# Patient Record
Sex: Female | Born: 1935 | Race: White | Hispanic: No | Marital: Married | Smoking: Former smoker
Health system: Southern US, Community
[De-identification: ages and names within clinical notes are randomized; demographics above are authoritative.]

## PROBLEM LIST (undated history)

## (undated) DIAGNOSIS — C4490 Unspecified malignant neoplasm of skin, unspecified: Secondary | ICD-10-CM

## (undated) DIAGNOSIS — I509 Heart failure, unspecified: Secondary | ICD-10-CM

## (undated) DIAGNOSIS — J449 Chronic obstructive pulmonary disease, unspecified: Secondary | ICD-10-CM

## (undated) DIAGNOSIS — K659 Peritonitis, unspecified: Secondary | ICD-10-CM

## (undated) DIAGNOSIS — D649 Anemia, unspecified: Secondary | ICD-10-CM

## (undated) DIAGNOSIS — H3530 Unspecified macular degeneration: Secondary | ICD-10-CM

## (undated) DIAGNOSIS — W19XXXA Unspecified fall, initial encounter: Secondary | ICD-10-CM

## (undated) DIAGNOSIS — IMO0002 Reserved for concepts with insufficient information to code with codable children: Secondary | ICD-10-CM

## (undated) DIAGNOSIS — I10 Essential (primary) hypertension: Secondary | ICD-10-CM

## (undated) DIAGNOSIS — I739 Peripheral vascular disease, unspecified: Secondary | ICD-10-CM

## (undated) DIAGNOSIS — I6529 Occlusion and stenosis of unspecified carotid artery: Secondary | ICD-10-CM

## (undated) DIAGNOSIS — Y92009 Unspecified place in unspecified non-institutional (private) residence as the place of occurrence of the external cause: Secondary | ICD-10-CM

## (undated) DIAGNOSIS — K224 Dyskinesia of esophagus: Secondary | ICD-10-CM

## (undated) DIAGNOSIS — Z87440 Personal history of urinary (tract) infections: Secondary | ICD-10-CM

## (undated) DIAGNOSIS — I5022 Chronic systolic (congestive) heart failure: Secondary | ICD-10-CM

## (undated) DIAGNOSIS — C50919 Malignant neoplasm of unspecified site of unspecified female breast: Secondary | ICD-10-CM

## (undated) HISTORY — DX: Chronic obstructive pulmonary disease, unspecified: J44.9

## (undated) HISTORY — DX: Dyskinesia of esophagus: K22.4

## (undated) HISTORY — DX: Unspecified place in unspecified non-institutional (private) residence as the place of occurrence of the external cause: Y92.009

## (undated) HISTORY — DX: Essential (primary) hypertension: I10

## (undated) HISTORY — DX: Peripheral vascular disease, unspecified: I73.9

## (undated) HISTORY — DX: Anemia, unspecified: D64.9

## (undated) HISTORY — DX: Occlusion and stenosis of unspecified carotid artery: I65.29

## (undated) HISTORY — PX: EYE SURGERY: SHX253

## (undated) HISTORY — DX: Chronic systolic (congestive) heart failure: I50.22

## (undated) HISTORY — PX: APPENDECTOMY: SHX54

## (undated) HISTORY — DX: Peritonitis, unspecified: K65.9

## (undated) HISTORY — DX: Unspecified macular degeneration: H35.30

## (undated) HISTORY — DX: Unspecified fall, initial encounter: W19.XXXA

---

## 1968-02-21 HISTORY — PX: HERNIA REPAIR: SHX51

## 1996-02-21 HISTORY — PX: MASTECTOMY: SHX3

## 1997-09-10 ENCOUNTER — Other Ambulatory Visit: Admission: RE | Admit: 1997-09-10 | Discharge: 1997-09-10 | Payer: Self-pay | Admitting: Internal Medicine

## 1998-02-23 ENCOUNTER — Ambulatory Visit (HOSPITAL_COMMUNITY): Admission: RE | Admit: 1998-02-23 | Discharge: 1998-02-23 | Payer: Self-pay | Admitting: Internal Medicine

## 1998-07-07 ENCOUNTER — Ambulatory Visit (HOSPITAL_COMMUNITY): Admission: RE | Admit: 1998-07-07 | Discharge: 1998-07-07 | Payer: Self-pay | Admitting: Internal Medicine

## 1998-09-23 ENCOUNTER — Other Ambulatory Visit: Admission: RE | Admit: 1998-09-23 | Discharge: 1998-09-23 | Payer: Self-pay | Admitting: Internal Medicine

## 1999-01-18 ENCOUNTER — Encounter: Admission: RE | Admit: 1999-01-18 | Discharge: 1999-01-18 | Payer: Self-pay | Admitting: Internal Medicine

## 1999-01-18 ENCOUNTER — Encounter: Payer: Self-pay | Admitting: Internal Medicine

## 1999-03-08 ENCOUNTER — Ambulatory Visit (HOSPITAL_COMMUNITY): Admission: RE | Admit: 1999-03-08 | Discharge: 1999-03-08 | Payer: Self-pay | Admitting: *Deleted

## 1999-09-01 ENCOUNTER — Encounter: Admission: RE | Admit: 1999-09-01 | Discharge: 1999-09-01 | Payer: Self-pay | Admitting: *Deleted

## 1999-09-15 ENCOUNTER — Other Ambulatory Visit: Admission: RE | Admit: 1999-09-15 | Discharge: 1999-09-15 | Payer: Self-pay | Admitting: Internal Medicine

## 2000-03-08 ENCOUNTER — Encounter: Payer: Self-pay | Admitting: Oncology

## 2000-03-08 ENCOUNTER — Encounter: Admission: RE | Admit: 2000-03-08 | Discharge: 2000-03-08 | Payer: Self-pay | Admitting: Oncology

## 2000-10-18 ENCOUNTER — Encounter: Payer: Self-pay | Admitting: Internal Medicine

## 2000-10-18 ENCOUNTER — Encounter: Admission: RE | Admit: 2000-10-18 | Discharge: 2000-10-18 | Payer: Self-pay | Admitting: Internal Medicine

## 2001-01-14 ENCOUNTER — Other Ambulatory Visit: Admission: RE | Admit: 2001-01-14 | Discharge: 2001-01-14 | Payer: Self-pay | Admitting: Internal Medicine

## 2001-03-13 ENCOUNTER — Encounter: Payer: Self-pay | Admitting: Oncology

## 2001-03-13 ENCOUNTER — Ambulatory Visit (HOSPITAL_COMMUNITY): Admission: RE | Admit: 2001-03-13 | Discharge: 2001-03-13 | Payer: Self-pay | Admitting: Oncology

## 2001-08-15 ENCOUNTER — Encounter: Admission: RE | Admit: 2001-08-15 | Discharge: 2001-08-15 | Payer: Self-pay

## 2001-12-16 ENCOUNTER — Encounter: Admission: RE | Admit: 2001-12-16 | Discharge: 2001-12-16 | Payer: Self-pay | Admitting: Internal Medicine

## 2001-12-16 ENCOUNTER — Encounter: Payer: Self-pay | Admitting: Internal Medicine

## 2002-02-04 ENCOUNTER — Encounter: Payer: Self-pay | Admitting: Oncology

## 2002-02-04 ENCOUNTER — Ambulatory Visit (HOSPITAL_COMMUNITY): Admission: RE | Admit: 2002-02-04 | Discharge: 2002-02-04 | Payer: Self-pay | Admitting: Oncology

## 2002-03-20 ENCOUNTER — Encounter: Payer: Self-pay | Admitting: Internal Medicine

## 2002-03-20 ENCOUNTER — Ambulatory Visit (HOSPITAL_COMMUNITY): Admission: RE | Admit: 2002-03-20 | Discharge: 2002-03-20 | Payer: Self-pay | Admitting: Internal Medicine

## 2002-04-09 ENCOUNTER — Encounter: Admission: RE | Admit: 2002-04-09 | Discharge: 2002-04-09 | Payer: Self-pay | Admitting: Oncology

## 2002-04-09 ENCOUNTER — Encounter: Payer: Self-pay | Admitting: Oncology

## 2003-01-29 ENCOUNTER — Other Ambulatory Visit: Admission: RE | Admit: 2003-01-29 | Discharge: 2003-01-29 | Payer: Self-pay | Admitting: Internal Medicine

## 2003-03-23 ENCOUNTER — Encounter: Admission: RE | Admit: 2003-03-23 | Discharge: 2003-03-23 | Payer: Self-pay | Admitting: Oncology

## 2003-06-12 ENCOUNTER — Encounter: Admission: RE | Admit: 2003-06-12 | Discharge: 2003-06-12 | Payer: Self-pay | Admitting: Internal Medicine

## 2003-06-18 ENCOUNTER — Encounter: Admission: RE | Admit: 2003-06-18 | Discharge: 2003-06-18 | Payer: Self-pay | Admitting: Internal Medicine

## 2004-03-23 ENCOUNTER — Encounter: Admission: RE | Admit: 2004-03-23 | Discharge: 2004-03-23 | Payer: Self-pay | Admitting: Oncology

## 2005-03-20 ENCOUNTER — Ambulatory Visit: Payer: Self-pay | Admitting: Oncology

## 2005-03-24 ENCOUNTER — Encounter: Admission: RE | Admit: 2005-03-24 | Discharge: 2005-03-24 | Payer: Self-pay | Admitting: Oncology

## 2005-03-28 ENCOUNTER — Other Ambulatory Visit: Admission: RE | Admit: 2005-03-28 | Discharge: 2005-03-28 | Payer: Self-pay | Admitting: Internal Medicine

## 2005-04-03 ENCOUNTER — Encounter: Admission: RE | Admit: 2005-04-03 | Discharge: 2005-04-03 | Payer: Self-pay | Admitting: Internal Medicine

## 2006-04-12 ENCOUNTER — Encounter: Admission: RE | Admit: 2006-04-12 | Discharge: 2006-04-12 | Payer: Self-pay | Admitting: Internal Medicine

## 2006-04-30 ENCOUNTER — Ambulatory Visit: Payer: Self-pay | Admitting: Vascular Surgery

## 2007-02-25 ENCOUNTER — Encounter: Admission: RE | Admit: 2007-02-25 | Discharge: 2007-02-25 | Payer: Self-pay | Admitting: Family Medicine

## 2007-04-26 ENCOUNTER — Ambulatory Visit: Payer: Self-pay | Admitting: Vascular Surgery

## 2008-05-13 ENCOUNTER — Ambulatory Visit: Payer: Self-pay | Admitting: Vascular Surgery

## 2008-11-27 ENCOUNTER — Encounter: Admission: RE | Admit: 2008-11-27 | Discharge: 2008-11-27 | Payer: Self-pay | Admitting: Internal Medicine

## 2009-03-25 ENCOUNTER — Ambulatory Visit: Payer: Self-pay | Admitting: Vascular Surgery

## 2009-05-20 ENCOUNTER — Ambulatory Visit: Payer: Self-pay | Admitting: Vascular Surgery

## 2009-09-24 ENCOUNTER — Ambulatory Visit: Payer: Self-pay | Admitting: Vascular Surgery

## 2010-02-20 HISTORY — PX: FEMORAL ARTERY STENT: SHX1583

## 2010-03-13 ENCOUNTER — Encounter: Payer: Self-pay | Admitting: *Deleted

## 2010-03-29 ENCOUNTER — Other Ambulatory Visit (INDEPENDENT_AMBULATORY_CARE_PROVIDER_SITE_OTHER): Payer: Medicare Other

## 2010-03-29 DIAGNOSIS — I6529 Occlusion and stenosis of unspecified carotid artery: Secondary | ICD-10-CM

## 2010-04-01 ENCOUNTER — Other Ambulatory Visit: Payer: Self-pay

## 2010-04-01 NOTE — Procedures (Unsigned)
CAROTID DUPLEX EXAM  INDICATION:  Followup carotid artery disease.  HISTORY: Diabetes:  No. Cardiac:  Arrhythmias. Hypertension:  Yes. Smoking:  No. Previous Surgery:  No. CV History:  History of stroke. Amaurosis Fugax No, Paresthesias No, Hemiparesis No                                      RIGHT             LEFT Brachial systolic pressure:         Mastectomy        143 Brachial Doppler waveforms:                           Normal Vertebral direction of flow:        Antegrade         Antegrade DUPLEX VELOCITIES (cm/sec) CCA peak systolic                   89                63 ECA peak systolic                   99                110 ICA peak systolic                   175               192 ICA end diastolic                   39                45 PLAQUE MORPHOLOGY:                  Mixed             Mixed PLAQUE AMOUNT:                      Moderate          Moderate PLAQUE LOCATION:                    ICA               ICA, ECA  IMPRESSION: 1. Bilateral internal carotid artery velocities suggest 40% to 59%     stenosis. 2. Antegrade flow in bilateral vertebral arteries.    ___________________________________________ Janetta Hora Darrick Penna, MD  EM/MEDQ  D:  03/29/2010  T:  03/29/2010  Job:  [id number]

## 2010-04-06 ENCOUNTER — Encounter (INDEPENDENT_AMBULATORY_CARE_PROVIDER_SITE_OTHER): Payer: Medicare Other

## 2010-04-06 DIAGNOSIS — I739 Peripheral vascular disease, unspecified: Secondary | ICD-10-CM

## 2010-05-17 ENCOUNTER — Ambulatory Visit (INDEPENDENT_AMBULATORY_CARE_PROVIDER_SITE_OTHER): Payer: Medicare Other | Admitting: Vascular Surgery

## 2010-05-17 DIAGNOSIS — M79609 Pain in unspecified limb: Secondary | ICD-10-CM

## 2010-05-17 NOTE — Assessment & Plan Note (Signed)
OFFICE VISIT  Jennifer, Graves DOB:  04/15/1929                                       05/17/2010 [id number]#:[id number]  Patient presents today for concern regarding claudication symptoms.  She is well-known to me from prior evaluations and also for a long-term follow-up of extracranial cerebrovascular occlusive disease which has been asymptomatic.  She has multiple complaints in her lower extremities.  She reports some aching and stinging sensations in both lower extremities that can occur with rest.  She does report that she has total leg symptoms with prolonged walking.  This does sound to be claudication.  She reports that she gets a heavy sensation bilaterally, and this is relieved with rest.  She does not have any history of tissue loss.  PAST MEDICAL HISTORY:  Significant for hypertension.  She is retired.  She is married with 2 children.  She does not smoke, having quit in 1999.  FAMILY HISTORY:  Significant only for venous varicosities in her mother. She denies any cardiac difficulties.  PHYSICAL EXAMINATION:  A well-developed and well-nourished white female appearing her stated age.  Blood pressure is 146/79, pulse 89, respirations 18.  HEENT is normal.  Her abdomen soft, nontender.  I do not feel any masses.  She has 1+ femoral pulses and absent popliteal and distal pulses bilaterally.  Musculoskeletal shows no major deformities or cyanosis.  Neurologic:  No focal weakness or paresthesias.  Skin: Without ulcers or rashes.  She does have extensive telangiectasia in both lower extremities.  She underwent noninvasive vascular laboratory studies in our office, and I have reviewed this with her.  This was from February 2012.  She does have monophasic wave forms in both lower extremities with an ankle-arm index of 0.53 on the right and 0.54 on the left.  This is slightly down from her study in our office 7 years ago in 2005 where her right ABI  was 0.61 and left was 0.69.  I discussed this at length patient and her husband present.  I explained this is no where near what we consider limb-threatening ischemia.  I did explain that she may be amenable to iliac angioplasty if she develops limiting claudication.  She does have limitations but is able to walk despite this.  She will continue to consider this but otherwise will continue to follow with [place] on a yearly basis for her noninvasive vascular lab.    Larina Earthly, M.D. Electronically Signed  TFE/MEDQ  D:  05/17/2010  T:  05/17/2010  Job:  [id number]  cc:   Dr. Alberteen Spindle at [facility]
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
## 2010-07-05 NOTE — Procedures (Signed)
CAROTID DUPLEX EXAM   INDICATION:  Followup evaluation of known carotid artery disease.   HISTORY:  Diabetes:  No.  Cardiac:  Arrhythmias.  Hypertension:  Yes.  Smoking:  Former smoker.  Previous Surgery:  No.  CV History:  Previous duplex on 04/26/2007 revealed 40-59% right ICA  stenosis and 20-39% left ICA stenosis.  Three to four days ago the  patient had an episode of a dark spot over her visual field.  The  patient is unsure which eye this occurred in.  Amaurosis Fugax No, Paresthesias No, Hemiparesis No                                       RIGHT             LEFT  Brachial systolic pressure:         138               138  Brachial Doppler waveforms:         Triphasic         Triphasic  Vertebral direction of flow:        Antegrade         Antegrade  DUPLEX VELOCITIES (cm/sec)  CCA peak systolic                   113               106  ECA peak systolic                   119               211  ICA peak systolic                   154               188  ICA end diastolic                   46                52  PLAQUE MORPHOLOGY:                  Calcified         Mixed  PLAQUE AMOUNT:                      Moderate          Moderate  PLAQUE LOCATION:                    Proximal ICA      Proximal ICA, CCA   IMPRESSION:  1. Three irregular structures are seen on the left side of the      thyroid.  The inferior structure measures 1.1 cm in diameter and is      heterogeneous.  There are two superior structures which are      homogenous and measure 0.5 cm AP and 0.4 cm AP.  2. 40-59% ICA stenosis bilaterally.  3. Left ECA stenosis.   ___________________________________________  Larina Earthly, M.D.   MC/MEDQ  D:  05/13/2008  T:  05/13/2008  Job:  [id number]

## 2010-07-05 NOTE — Procedures (Signed)
CAROTID DUPLEX EXAM   INDICATION:  Followup, carotid artery disease.   HISTORY:  Diabetes:  No.  Cardiac:  Arrhythmia.  Hypertension:  Yes.  Smoking:  Quit 10 years ago.  Previous Surgery:  No.  CV History:  Amaurosis Fugax No, Paresthesias No, Hemiparesis No                                       RIGHT             LEFT  Brachial systolic pressure:         140               140  Brachial Doppler waveforms:         Biphasic          Biphasic  Vertebral direction of flow:        Antegrade         Antegrade  DUPLEX VELOCITIES (cm/sec)  CCA peak systolic                   88                86  ECA peak systolic                   98                77  ICA peak systolic                   154               115  ICA end diastolic                   41                29  PLAQUE MORPHOLOGY:                  Calcified         Calcified with  shadowing  PLAQUE AMOUNT:                      Moderate          Moderate  PLAQUE LOCATION:                    ICA               ICA   IMPRESSION:  1. 40-59% right internal carotid artery stenosis.  2. 20-39% left internal carotid artery stenosis; however, calcified      plaque with shadowing could obscure a more severe stenosis.  3. Study essentially unchanged from 04/30/06.   ___________________________________________  Larina Earthly, M.D.   DP/MEDQ  D:  04/26/2007  T:  04/26/2007  Job:  [id number]

## 2010-07-05 NOTE — Assessment & Plan Note (Signed)
OFFICE VISIT   Jennifer, Graves  DOB:  [birth year]                                       09/24/2009  [id number]#:[id number]   The patient presents today for evaluation of lower extremity discomfort.  She is well-known to me from prior follow-up of known moderate to severe  carotid disease and also for follow-up of her moderate lower extremity  claudication symptoms.  She had an episode several weeks ago where she  had been on a relatively short car ride with her daughter, of about an  hour and she had worsening discomfort in both lower extremities  following this.  She reports this has responded since then and she has  intermittent pain in both legs that can be in her calves or her thighs.  She does not have any swelling.  She does not have any neurologic  deficits.  She reports currently she is having mild discomfort.  She had  no tissue loss.   MEDICAL HISTORY:  Reviewed and unchanged.  She does have hypertension,  does have a history of prior mastectomy, a prior appendectomy.   She is married with 2 children.  Quit smoking 12 years.  Does not drink  alcohol.   FAMILY HISTORY:  Positive for venous varicosities in her mother.   She does take an aspirin on a daily basis.  Her med list is attached in  her chart.   PHYSICAL EXAMINATION:  Well-developed, well-nourished white female who  is appearing stated age.  She does have a palpable femoral pulses  bilaterally, she does have scattered telangiectasias and some mild small  varicosities in both lower extremities.  Her radial pulses are 2+  bilaterally.  Carotid arteries without bruits bilaterally.   I discussed the most recent noninvasive study with her from her carotid  standpoint.  At this time she did have moderate stenosis bilaterally  with no significant change.  We will continue to follow these at 13-month  intervals.  Her lower extremities have not any noninvasive studies since  2006.  At this time  she had an ankle arm index of 0.6 bilaterally.  I  discussed this with the patient.  Apparently she does not have any  classic claudication type symptoms.  Some of her symptoms are related to  this but the nature of intermittent and can be in thighs and calves  speak against this.  I have reassured her that I do not see anything  dangerous.  If she has persistent problems, I would suspect that time  neurologic evaluation would be most appropriate.  She is reassured with  this discussion and will continue in our noninvasive vascular lab in  follow-up for carotid disease.     Larina Earthly, M.D.  Electronically Signed   TFE/MEDQ  D:  09/24/2009  T:  09/27/2009  Job:  [id number]   cc:   Nancee Liter, MD

## 2010-07-05 NOTE — Procedures (Signed)
CAROTID DUPLEX EXAM   INDICATION:  Followup known carotid disease.   HISTORY:  Diabetes:  No.  Cardiac:  Arrhythmias.  Hypertension:  Yes.  Smoking:  Former smoker.  Previous Surgery:  No.  CV History:  No history of a stroke.  Amaurosis Fugax No, Paresthesias No, Hemiparesis No                                       RIGHT             LEFT  Brachial systolic pressure:         Mastectomy        150  Brachial Doppler waveforms:         Mastectomy        Triphasic  Vertebral direction of flow:        Antegrade         Antegrade  DUPLEX VELOCITIES (cm/sec)  CCA peak systolic                   108               87  ECA peak systolic                   104               113  ICA peak systolic                   230               206  ICA end diastolic                   46                49  PLAQUE MORPHOLOGY:                  Calcific and heterogeneous          Calcific and  heterogeneous  PLAQUE AMOUNT:                      Mild to moderate  Mild to moderate  PLAQUE LOCATION:                    ICA               ICA and ECA   IMPRESSION:  1. 60%-79% stenosis noted in bilateral internal carotid arteries.  2. Antegrade flow is noted in bilateral vertebral arteries.         ___________________________________________  Janetta Hora Fields, MD   NT/MEDQ  D:  05/20/2009  T:  05/20/2009  Job:  [id number]

## 2010-10-07 ENCOUNTER — Ambulatory Visit (HOSPITAL_COMMUNITY): Admission: RE | Admit: 2010-10-07 | Payer: Medicare Other | Source: Ambulatory Visit | Admitting: Vascular Surgery

## 2010-10-18 ENCOUNTER — Other Ambulatory Visit: Payer: Self-pay | Admitting: Otolaryngology

## 2010-10-18 DIAGNOSIS — R1310 Dysphagia, unspecified: Secondary | ICD-10-CM

## 2010-10-20 ENCOUNTER — Ambulatory Visit
Admission: RE | Admit: 2010-10-20 | Discharge: 2010-10-20 | Disposition: A | Discharge status: Home / Self Care | Payer: Medicare Other | Source: Ambulatory Visit | Attending: Otolaryngology | Admitting: Otolaryngology

## 2010-10-20 DIAGNOSIS — R1310 Dysphagia, unspecified: Secondary | ICD-10-CM

## 2010-11-15 ENCOUNTER — Ambulatory Visit (HOSPITAL_COMMUNITY)
Admission: RE | Admit: 2010-11-15 | Discharge: 2010-11-15 | Disposition: A | Discharge status: Home / Self Care | Payer: Medicare Other | Source: Ambulatory Visit | Attending: Surgery | Admitting: Surgery

## 2010-11-15 DIAGNOSIS — I10 Essential (primary) hypertension: Secondary | ICD-10-CM | POA: Insufficient documentation

## 2010-11-15 DIAGNOSIS — K219 Gastro-esophageal reflux disease without esophagitis: Secondary | ICD-10-CM | POA: Insufficient documentation

## 2010-11-15 DIAGNOSIS — I70219 Atherosclerosis of native arteries of extremities with intermittent claudication, unspecified extremity: Secondary | ICD-10-CM

## 2010-11-15 HISTORY — PX: OTHER SURGICAL HISTORY: SHX169

## 2010-11-15 LAB — POCT I-STAT, CHEM 8
BUN: 14 mg/dL (ref 6–23)
Creatinine, Ser: 0.8 mg/dL (ref 0.50–1.10)
HCT: 38 % (ref 36.0–46.0)
Potassium: 3.9 mEq/L (ref 3.5–5.1)
Sodium: 139 mEq/L (ref 135–145)

## 2010-11-17 ENCOUNTER — Telehealth: Payer: Self-pay

## 2010-11-17 NOTE — Telephone Encounter (Signed)
Pt. Called with c/o having strong stomach cramps that move into chest area, and makes her feel she is having a heart attack, since starting Plavix. (s/p stent of right and left Ext. Iliac Art. on 9-25)  States the cramps come in waves and make her feel very uncomfortable.  Called Dr. Myra Gianotti and reported pt's sx's.  Was advised to have pt. Stop Plavix and to start ASA 325 mg qd.  Several attempts to contact pt. per phone to instruct on stopping Plavix and to start ASA 325 mg/day.  Line continuously busy. 11/18/10 @ 10:40 AM: pt. called office this am and stated phone service had been out.  Advised of Dr. Estanislado Spire recommendations to stop Plavix, and to start ASA 325 mg qd.  Stated she felt better since off of Plavix. Verbalized understanding of instructions.

## 2010-11-19 NOTE — Op Note (Signed)
[name], [name]            ACCOUNT NO.:  [id number]  MEDICAL RECORD NO.:  [id number]  LOCATION:  [facility]                         FACILITY:  [facility]  PHYSICIAN:  [name], MDDATE OF BIRTH:  August 05, 1929  DATE OF PROCEDURE:  11/15/2010 DATE OF DISCHARGE:                              OPERATIVE REPORT   PREOPERATIVE NOTE:  Bilateral claudication.  POSTOPERATIVE DIAGNOSIS:  Bilateral claudication.  PROCEDURES PERFORMED: 1. Ultrasound access, right femoral artery. 2. Abdominal aortogram. 3. Bilateral lower extremity runoff. 4. Second-order catheterization. 5. Stent, left external iliac artery. 6. Stent, right external iliac artery.  INDICATIONS:  This is a 77 year old female with lifestyle-limiting claudication.  She has tried to manage this medically, but can no longer tolerate her decrease in level of activity.  She comes in today for arteriogram and possible intervention.  PROCEDURE:  The patient was identified in the holding area, taken to room #8, placed supine on the table.  Bilateral groins were prepped and draped in the usual fashion.  A time-out was called.  The right femoral artery was evaluated with ultrasound and found to be heavily calcified but patent.  A digital ultrasound image was acquired.  The right femoral artery was accessed under ultrasound guidance with an 18-gauge needle. A Bentson wire was then advanced into the aorta under fluoroscopic visualization.  A 5-French sheath was placed over the wire.  Omni flush catheter was advanced to the level of the L1.  Abdominal aortogram was obtained.  Next, the catheter was pulled down the aortic bifurcation and pelvic angiogram was obtained in multiple oblique projections.  Next, bilateral lower extremity runoff was performed.  FINDINGS:  Aortogram:  The visualized portions of suprarenal abdominal showed no significant disease.  The infrarenal abdominal aorta is heavily calcified.  The right renal artery  is widely patent.  The left renal artery is not visualized.  Pelvic angiogram:  Bilateral common iliac arteries are heavily calcified, but patent without significant stenosis.  There is a high- grade stenosis throughout the entire right external iliac system with a patent hypogastric artery contributing to multiple collaterals down the leg.  There is a short segment stenosis within the left external iliac artery with a dominant hypogastric artery.  Right lower extremity:  The right common femoral artery is heavily diseased.  The superficial femoral artery is occluded.  The profunda femoral artery is patent throughout its course.  The profunda collaterals reconstitute an above-knee popliteal artery.  The dominant runoff is the anterior tibial artery on the left.  Left lower extremity:  The left common femoral artery is heavily diseased with multiple collaterals.  The profunda femoral artery is patent.  It reconstitutes an above-knee popliteal artery.  There is diffuse trifurcation disease.  The dominant runoff is the posterior tibial and the anterior tibial reconstitutes  At this point, a decision was made to intervene.  An Omni flush catheter, Bentson wire, and an end-hole catheter were used to cross the aortic bifurcation and placed catheter in the left external iliac artery.  A Rosen wire was then placed.  I then up-sized to a 6-French sheath.  At this point, the 6-French sheath was taken into the left common iliac artery.  I elected to primarily stent the lesion within the left external iliac artery.  A 7 x 30 Abbott Absolute Pro stent was successfully deployed and molded to confirmation with a 5-mm balloon. Completion angiogram revealed resolution of stenosis within the left external iliac artery.  Next, a decision was made to treat the stenosis within the right external iliac artery which was throughout the entire artery.  An Abbott 6 x 80 self-expanding stent was deployed and  molded to confirmation with a 5-mm balloon.  Completion angiogram revealed resolution of the stenosis within the right external iliac artery.  At this point in time, decision was made to terminate the procedure.  The long 6-French sheath was exchanged out for a short 6-French sheath.  The patient was taken to the holding area for sheath pull once her coagulation profile corrects.  IMPRESSION: 1. Heavily calcified abdominal aorta without focal stenosis. 2. High-grade left external iliac stenosis successfully treated using     an Abbott Absolute Pro 7 x 30 self-expanding stent. 3. Diffuse right external iliac stenosis successfully treated using a     6 x 80 Abbott Absolute Pro self-expanding stent. 4. Bilateral diffuse common femoral artery disease with bilateral     superficial femoral occlusions.     Jorge Ny, MD     VWB/MEDQ  D:  11/15/2010  T:  11/15/2010  Job:  [id number]  Electronically Signed by Arelia Longest IV MD on 11/19/2010 09:59:25 AM

## 2010-12-19 ENCOUNTER — Encounter: Payer: Self-pay | Admitting: Vascular Surgery

## 2010-12-20 ENCOUNTER — Encounter: Payer: Self-pay | Admitting: Vascular Surgery

## 2010-12-20 ENCOUNTER — Other Ambulatory Visit (INDEPENDENT_AMBULATORY_CARE_PROVIDER_SITE_OTHER): Payer: Medicare Other | Admitting: *Deleted

## 2010-12-20 ENCOUNTER — Ambulatory Visit (INDEPENDENT_AMBULATORY_CARE_PROVIDER_SITE_OTHER): Payer: Medicare Other | Admitting: Vascular Surgery

## 2010-12-20 VITALS — BP 147/82 | HR 114 | Ht 65.0 in | Wt 101.8 lb

## 2010-12-20 DIAGNOSIS — I70219 Atherosclerosis of native arteries of extremities with intermittent claudication, unspecified extremity: Secondary | ICD-10-CM

## 2010-12-20 DIAGNOSIS — Z48812 Encounter for surgical aftercare following surgery on the circulatory system: Secondary | ICD-10-CM

## 2010-12-20 NOTE — Progress Notes (Signed)
The patient presents today for followup of her bilateral iliac artery angioplasty on 11/15/2010. She had limiting claudication. She has had marked improvement in her right leg symptoms but continues to have left leg calf claudication. She has no rest pain.  Physical exam: Palpable femoral pulses bilaterally absent popliteal and pedal pulses bilaterally. No evidence of groin complications.  Noninvasive vascular lab: Ankle arm index improved to 0.72 on the right from 0.53. On the left tract arm index is 0.57 with minimal change from her preprocedure 0.54.  Impression and plan: Prove claudication right leg no significant change in her left leg. The patient does have known bilateral superficial artery occlusions. I discussed this at length with the patient and her husband I have recommended continued observation and certainly would not recommend bypass of her femoral-popliteal segment on the basis of claudication she is able to tolerate this level of claudication. We will see her again in 6 months with repeat vascular lab.

## 2010-12-22 ENCOUNTER — Other Ambulatory Visit: Payer: Self-pay

## 2010-12-22 DIAGNOSIS — I70219 Atherosclerosis of native arteries of extremities with intermittent claudication, unspecified extremity: Secondary | ICD-10-CM

## 2010-12-29 ENCOUNTER — Encounter: Payer: Self-pay | Admitting: Vascular Surgery

## 2010-12-29 DIAGNOSIS — I70219 Atherosclerosis of native arteries of extremities with intermittent claudication, unspecified extremity: Secondary | ICD-10-CM | POA: Insufficient documentation

## 2011-04-11 ENCOUNTER — Encounter: Payer: Self-pay | Admitting: *Deleted

## 2011-04-11 ENCOUNTER — Ambulatory Visit (INDEPENDENT_AMBULATORY_CARE_PROVIDER_SITE_OTHER): Payer: Medicare Other | Admitting: Vascular Surgery

## 2011-04-11 ENCOUNTER — Other Ambulatory Visit: Payer: Self-pay | Admitting: *Deleted

## 2011-04-11 ENCOUNTER — Encounter: Payer: Self-pay | Admitting: Vascular Surgery

## 2011-04-11 VITALS — BP 139/79 | HR 87 | Resp 16 | Ht 65.0 in | Wt 94.4 lb

## 2011-04-11 DIAGNOSIS — I999 Unspecified disorder of circulatory system: Secondary | ICD-10-CM | POA: Insufficient documentation

## 2011-04-11 DIAGNOSIS — M629 Disorder of muscle, unspecified: Secondary | ICD-10-CM | POA: Insufficient documentation
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
## 2011-04-14 ENCOUNTER — Ambulatory Visit (HOSPITAL_COMMUNITY)
Admission: RE | Admit: 2011-04-14 | Discharge: 2011-04-14 | Disposition: A | Discharge status: Home / Self Care | Payer: Medicare Other | Source: Ambulatory Visit | Attending: Vascular Surgery | Admitting: Vascular Surgery

## 2011-04-14 ENCOUNTER — Encounter (HOSPITAL_COMMUNITY)
Admission: RE | Disposition: A | Discharge status: Home / Self Care | Payer: Self-pay | Source: Ambulatory Visit | Attending: Vascular Surgery

## 2011-04-14 DIAGNOSIS — I748 Embolism and thrombosis of other arteries: Secondary | ICD-10-CM | POA: Insufficient documentation

## 2011-04-14 DIAGNOSIS — I742 Embolism and thrombosis of arteries of the upper extremities: Secondary | ICD-10-CM

## 2011-04-14 DIAGNOSIS — I998 Other disorder of circulatory system: Secondary | ICD-10-CM | POA: Insufficient documentation

## 2011-04-14 DIAGNOSIS — I6529 Occlusion and stenosis of unspecified carotid artery: Secondary | ICD-10-CM

## 2011-04-14 HISTORY — PX: ARCH AORTOGRAM: SHX5501

## 2011-04-14 HISTORY — PX: CAROTID ANGIOGRAM: SHX5504

## 2011-04-14 HISTORY — PX: SUBCLAVIAN ANGIOGRAM: SHX5350

## 2011-04-14 LAB — POCT I-STAT, CHEM 8
BUN: 22 mg/dL (ref 6–23)
Creatinine, Ser: 0.9 mg/dL (ref 0.50–1.10)
Glucose, Bld: 93 mg/dL (ref 70–99)
Hemoglobin: 12.2 g/dL (ref 12.0–15.0)
Potassium: 3.9 mEq/L (ref 3.5–5.1)
Sodium: 142 mEq/L (ref 135–145)

## 2011-04-14 SURGERY — Surgical Case
Anesthesia: *Unknown

## 2011-04-14 SURGERY — ARCH AORTOGRAM
Anesthesia: LOCAL | Laterality: Right

## 2011-04-14 MED ORDER — SODIUM CHLORIDE 0.9 % IV SOLN: 500.0000 mL | Freq: Once | INTRAVENOUS | Status: DC | PRN

## 2011-04-14 MED ORDER — METOPROLOL TARTRATE 1 MG/ML IV SOLN: 2.0000 mg | INTRAVENOUS | Status: DC | PRN

## 2011-04-14 MED ORDER — LABETALOL HCL 5 MG/ML IV SOLN
INTRAVENOUS | Status: AC
  Filled 2011-04-14: qty 4

## 2011-04-14 MED ORDER — HEPARIN (PORCINE) IN NACL 2-0.9 UNIT/ML-% IJ SOLN
INTRAMUSCULAR | Status: AC
  Filled 2011-04-14: qty 1000

## 2011-04-14 MED ORDER — HYDRALAZINE HCL 20 MG/ML IJ SOLN: 10.0000 mg | INTRAMUSCULAR | Status: DC | PRN

## 2011-04-14 MED ORDER — LIDOCAINE HCL (PF) 1 % IJ SOLN
INTRAMUSCULAR | Status: AC
  Filled 2011-04-14: qty 30

## 2011-04-14 MED ORDER — ACETAMINOPHEN 325 MG PO TABS: 325.0000 mg | ORAL_TABLET | ORAL | Status: DC | PRN

## 2011-04-14 MED ORDER — SODIUM CHLORIDE 0.45 % IV SOLN: INTRAVENOUS | Status: DC

## 2011-04-14 MED ORDER — DOCUSATE SODIUM 100 MG PO CAPS: 100.0000 mg | ORAL_CAPSULE | Freq: Every day | ORAL | Status: DC

## 2011-04-14 MED ORDER — PHENOL 1.4 % MT LIQD: 1.0000 | OROMUCOSAL | Status: DC | PRN

## 2011-04-14 MED ORDER — GUAIFENESIN-DM 100-10 MG/5ML PO SYRP: 15.0000 mL | ORAL_SOLUTION | ORAL | Status: DC | PRN

## 2011-04-14 MED ORDER — LABETALOL HCL 5 MG/ML IV SOLN: 10.0000 mg | INTRAVENOUS | Status: DC | PRN

## 2011-04-14 MED ORDER — ACETAMINOPHEN 325 MG RE SUPP: 325.0000 mg | RECTAL | Status: DC | PRN

## 2011-04-14 MED ORDER — ONDANSETRON HCL 4 MG/2ML IJ SOLN: 4.0000 mg | Freq: Four times a day (QID) | INTRAMUSCULAR | Status: DC | PRN

## 2011-04-14 NOTE — Op Note (Signed)
Procedure: Arch aortogram with right upper extremity arteriogram and right carotid angiogram Preoperative diagnosis: Ischemia right hand Postoperative diagnosis: Same  Anesthesia: Local   Operative details: After obtaining informed consent, the patient was taken to the PV lab. The patient was placed in supine position the Angio table. Both groins were prepped and draped in usual sterile fashion. Local anesthesia was infiltrated over the right common femoral artery. An introducer needle was placed into the right common femoral artery and there was good backbleeding from this.  Next and 0.035 Versacore wire was threaded up into the abdominal aorta and into the aortic arch under fluoroscopic guidance. A 5 French sheath was  the guidewire the right common femoral artery. This was thoroughly flushed with heparinized saline. A 5 French pigtail catheter was then placed through the guidewire advanced up in the aortic arch and arch aortogram obtained in a 40 LAO view. This shows normal arch configuration with occlusion of the right subclavian artery. This is a tapered lesion with a very diseased subclavian that occludes at the axillary subclavian junction.  The right vertebral artery is occluded.   The innominate artery is patent. The right common carotid artery is patent without stenosis. The left common carotid artery is patent. The left subclavian artery is patent. The left vertebral artery is occluded.  The 5 French pigtail catheter was then pulled back over the guidewire and exchanged for an H-1 catheter. This was then used to selectively catheterize the innominate artery and the right common carotid artery.  Projections were done in AP and lateral projection. Intracranial views were also performed to be interpreted later by the neuroradiologist. The views showed minimal stenosis of the right carotid bifurcation. The right internal and external carotid arteries are otherwise patent.  Next the H1 catheter was  pulled back and several attempts were made to selectively catheterize the right subclavian artery unsuccessfully.  The H1 catheter was left in the innominate and a right upper extremity angiogram was performed.  The axillary artery is occluded.  The brachial artery reconstitutes via collaterals and the brachial bifurcation is visualized but distal runoff below this is not well opacified.  Next and attempt was made to catheterize the left common carotid and the left subclavian artery was inadvertently cannulated 2 times. Attempts to cannulate the left side were aborted and a magnified view of the arch was performed via the pigtail catheter after exchanging the H1 over the wire.  This was done to clarify the origin of the left common carotid.  This was patent with no significant narrowing.  Next, the pigtail catheter was pulled back over the guidewire and out. The 5 French sheath was thoroughly flushed with heparinized saline. The patient was taken to the holding area in stable condition.  The patient tolerated the procedure well and there were no neurologic complications. The patient was transported to the holding area in stable condition.   Operative findings: #1 Normal arch anatomy #2  Occlusion right subclavian artery   #3 No significant right internal carotid artery stenosis   Fabienne Bruns, MD  [facility]  Office: [phone]  Pager: [phone]

## 2011-04-14 NOTE — Discharge Instructions (Signed)
Arteriogram Care After These instructions give you information on caring for yourself after your procedure. Your doctor may also give you more specific instructions. Call your doctor if you have any problems or questions after your procedure. HOME CARE  Stay in bed the rest of the day.   Keep your leg straight for at least 6 hours.   Do not lift anything heavier than 10 pounds (about a gallon of milk) for 2 days.   Do not walk a lot, run, or drive for 2 days.   Return to normal activities in 2 days or as told by your doctor.   Drink plenty of fluids today water is the best  Avoid caffeine beverages GET HELP RIGHT AWAY IF:   You have fever of 102 F (38.9 C) or higher.  Groin Site Care Refer to this sheet in the next few weeks. These instructions provide you with information on caring for yourself after your procedure. Your caregiver may also give you more specific instructions. Your treatment has been planned according to current medical practices, but problems sometimes occur. Call your caregiver if you have any problems or questions after your procedure. HOME CARE INSTRUCTIONS You may shower 24 hours after the procedure. Remove the bandage (dressing) and gently wash the site with plain soap and water. Gently pat the site dry.  Do not apply powder or lotion to the site.  Do not sit in a bathtub, swimming pool, or whirlpool for 5 to 7 days.  No bending, squatting, or lifting anything over 10 pounds (4.5 kg) as directed by your caregiver.  Inspect the site at least twice daily.  Do not drive home if you are discharged the same day of the procedure. Have someone else drive you.  You may drive 24 hours after the procedure unless otherwise instructed by your caregiver.  What to expect: Any bruising will usually fade within 1 to 2 weeks.  Blood that collects in the tissue (hematoma) may be painful to the touch. It should usually decrease in size and tenderness within 1 to 2 weeks.    SEEK IMMEDIATE MEDICAL CARE IF: You have unusual pain at the groin site or down the affected leg.  You have redness, warmth, swelling, or pain at the groin site.  You have drainage (other than a small amount of blood on the dressing).  You have chills.  You have a fever or persistent symptoms for more than 72 hours.  You have a fever and your symptoms suddenly get worse.  Your leg becomes pale, cool, tingly, or numb.  You have heavy bleeding from the site. Hold pressure on the site.  Document Released: 03/11/2010 Document Revised: 10/19/2010 Document Reviewed: 03/11/2010  [facility] Patient Information 2012 [place], [place].   You have more pain in your leg.   The leg that was cut is:   Bleeding.   Puffy (swollen) or red.   Cold.   Pale or changes color.   Weak.   Tingly or numb.  If you go to the Emergency Room, tell your nurse that you have had an arteriogram. Take this paper with you to show the nurse. MAKE SURE YOU:  Understand these instructions.   Will watch your condition.   Will get help right away if you are not doing well or get worse.
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
## 2011-04-14 NOTE — Interval H&P Note (Signed)
History and Physical Interval Note:  04/14/2011 9:04 AM  [name]  has presented today for surgery, with the diagnosis of upper extremity  The various methods of treatment have been discussed with the patient and family. After consideration of risks, benefits and other options for treatment, the patient has consented to  Procedure(s) (LRB): ARCH AORTOGRAM (N/A) as a surgical intervention .  The patients' history has been reviewed, patient examined, no change in status, stable for surgery.  I have reviewed the patients' chart and labs.  Questions were answered to the patient's satisfaction.     Jaquise Faux E

## 2011-04-26 NOTE — Consult Note (Signed)
NAME:  [name], [name]                 ACCOUNT NO.:  MEDICAL RECORD NO.:  [id number]  LOCATION:                                 FACILITY:  PHYSICIAN:  Jeniah Kishi K. Katiya Fike, M.D.DATE OF BIRTH:  1929-05-04  DATE OF CONSULTATION: DATE OF DISCHARGE:                                CONSULTATION   CLINICAL HISTORY:  Dizziness.  EXAMINATION:  Intracranial interpretation of bilateral common carotid arteriograms.  The right common carotid arteriogram demonstrates fusiform prominence of the distal one-third of the cervical portion of the right internal carotid artery.  Near-normal caliber continues into the petrous segment.  There is a fusiform prominence of the distal petrous segment extending into the petrous cavernous junction.  Mild fusiform prominence is also noted of the cavernous segment.  There is an irregular outpouching arising in the right posterior communicating artery region, which measures approximately 5-6 mm and associated right posterior communicating artery seen opacifying the right posterior cerebral artery and retrogradely the basilar artery.  On the AP projection, it appears as the retrograde opacification extends into the vertebrobasilar junctions with flow into the right posterior inferior cerebral artery also noted.  The right middle and right anterior cerebral arteries opacified normally into the capillary and the venous phases.  No images of the left common carotid arteriogram are available.  IMPRESSION: 1. Abnormal fusiform prominence of the distal cervical segment of the     right internal carotid artery, the petrous segment, and the     cavernous segments suggestive of vascular dysplasia. 2. 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 3. Retrograde opacification via the right posterior communicating     artery of the basilar artery to the level of the vertebrobasilar  junctions bilaterally, and also of the right posterior inferior     cerebral artery.         ______________________________ Grandville Silos Corliss Skains, M.D.    SKD/MEDQ  D:  04/25/2011  T:  04/26/2011  Job:  [id number]

## 2011-05-15 ENCOUNTER — Encounter: Payer: Self-pay | Admitting: Vascular Surgery

## 2011-05-16 ENCOUNTER — Encounter: Payer: Self-pay | Admitting: Vascular Surgery

## 2011-05-16 ENCOUNTER — Ambulatory Visit (INDEPENDENT_AMBULATORY_CARE_PROVIDER_SITE_OTHER): Payer: Medicare Other | Admitting: Vascular Surgery

## 2011-05-16 VITALS — BP 100/60 | HR 60 | Temp 97.6°F | Resp 16 | Ht 65.0 in | Wt 95.0 lb

## 2011-05-16 DIAGNOSIS — I70219 Atherosclerosis of native arteries of extremities with intermittent claudication, unspecified extremity: Secondary | ICD-10-CM

## 2011-05-16 NOTE — Progress Notes (Signed)
The patient is a for followup of her diffuse peripheral vascular occlusive disease. She was recently undergone arch and right arm arteriogram where she presented with severe ischemia of her right hand. She has compensated for this. She initially had severe cyanotic tips of her fingers and this has resolved. Her arteriogram revealed occlusion of her subclavian artery at the level of the axilla. The left axillary artery was not study but on some of the films looked quite small as well. She reports today that her right hand is improved. She does report fatigue in both arms. I do not palpate radial pulses on either hand or wrist. She also reports recently did a difficult to walking with the lower trim the claudication. She is status post stenting of an extensive iliac disease in September of 2012.  Physical exam well-developed well-nourished white female in no acute distress. I do not palpate pedal pulses. She does have diminished femoral pulses bilaterally. He has no tissue loss and no cyanosis in her hands. She has no tissue loss in her feet.  Impression and plan. I reviewed these findings again with [name]. She is quite uncomfortable related to her diffuse arterial insufficiency. I explained unfortunately that she really has 4 separate issues in 4 separate extremities. She is comfortable with continued observation only at this time. She'll notify should he develop any tissue loss or this progresses otherwise we'll see her again in 3 months for continued discussion

## 2011-05-18 ENCOUNTER — Ambulatory Visit (INDEPENDENT_AMBULATORY_CARE_PROVIDER_SITE_OTHER): Payer: Medicare Other | Admitting: *Deleted

## 2011-05-18 DIAGNOSIS — I6529 Occlusion and stenosis of unspecified carotid artery: Secondary | ICD-10-CM

## 2011-05-31 ENCOUNTER — Other Ambulatory Visit: Payer: Self-pay | Admitting: *Deleted

## 2011-05-31 DIAGNOSIS — I6529 Occlusion and stenosis of unspecified carotid artery: Secondary | ICD-10-CM

## 2011-05-31 NOTE — Procedures (Unsigned)
CAROTID DUPLEX EXAM  INDICATION:  Carotid disease  HISTORY: Diabetes:  no Cardiac:  no Hypertension:  yes Smoking:  previous Previous Surgery:  Right mastectomy CV History:  History of stroke, currently asymptomatic. Amaurosis Fugax No, Paresthesias No, Hemiparesis No                                      RIGHT             LEFT Brachial systolic pressure: Brachial Doppler waveforms: Vertebral direction of flow:        Not visualized    Not visualized DUPLEX VELOCITIES (cm/sec) CCA peak systolic                   70                63 ECA peak systolic                   60                297 ICA peak systolic                   144               148 ICA end diastolic                   21                31 PLAQUE MORPHOLOGY:                  Heterogeneous     Heterogeneous PLAQUE AMOUNT:                      Moderate          Moderate PLAQUE LOCATION:                    ICA               ICA/ECA  IMPRESSION:  Doppler velocity suggests high end 1% to 39% stenosis of the bilateral proximal internal carotid arteries. Left external carotid artery stenosis noted. Unable to visualize the bilateral vertebral arteries. Velocities of the bilateral internal carotid arteries appear mildly less than previously recorded when compared to the previous exam on 03/29/2010  ___________________________________________ Larina Earthly, M.D.  CH/MEDQ  D:  05/24/2011  T:  05/24/2011  Job:  [id number]

## 2011-06-01 ENCOUNTER — Encounter: Payer: Self-pay | Admitting: Vascular Surgery

## 2011-06-20 ENCOUNTER — Other Ambulatory Visit: Payer: Medicare Other

## 2011-06-20 ENCOUNTER — Ambulatory Visit: Payer: Medicare Other | Admitting: Vascular Surgery

## 2011-07-09 ENCOUNTER — Inpatient Hospital Stay (HOSPITAL_COMMUNITY)
Admission: EM | Admit: 2011-07-09 | Discharge: 2011-07-11 | DRG: 291 | Disposition: A | Discharge status: Home / Self Care | Payer: Medicare Other | Source: Ambulatory Visit | Attending: Internal Medicine | Admitting: Internal Medicine

## 2011-07-09 ENCOUNTER — Encounter (HOSPITAL_COMMUNITY): Payer: Self-pay | Admitting: Emergency Medicine

## 2011-07-09 ENCOUNTER — Emergency Department (HOSPITAL_COMMUNITY): Payer: Medicare Other

## 2011-07-09 DIAGNOSIS — J9601 Acute respiratory failure with hypoxia: Secondary | ICD-10-CM | POA: Diagnosis present

## 2011-07-09 DIAGNOSIS — Z66 Do not resuscitate: Secondary | ICD-10-CM | POA: Diagnosis not present

## 2011-07-09 DIAGNOSIS — B9620 Unspecified Escherichia coli [E. coli] as the cause of diseases classified elsewhere: Secondary | ICD-10-CM | POA: Diagnosis present

## 2011-07-09 DIAGNOSIS — E43 Unspecified severe protein-calorie malnutrition: Secondary | ICD-10-CM | POA: Diagnosis present

## 2011-07-09 DIAGNOSIS — J4489 Other specified chronic obstructive pulmonary disease: Secondary | ICD-10-CM | POA: Diagnosis present

## 2011-07-09 DIAGNOSIS — J159 Unspecified bacterial pneumonia: Secondary | ICD-10-CM

## 2011-07-09 DIAGNOSIS — Z681 Body mass index (BMI) 19 or less, adult: Secondary | ICD-10-CM

## 2011-07-09 DIAGNOSIS — Z853 Personal history of malignant neoplasm of breast: Secondary | ICD-10-CM

## 2011-07-09 DIAGNOSIS — M810 Age-related osteoporosis without current pathological fracture: Secondary | ICD-10-CM | POA: Diagnosis present

## 2011-07-09 DIAGNOSIS — Z79899 Other long term (current) drug therapy: Secondary | ICD-10-CM

## 2011-07-09 DIAGNOSIS — I739 Peripheral vascular disease, unspecified: Secondary | ICD-10-CM | POA: Diagnosis present

## 2011-07-09 DIAGNOSIS — I509 Heart failure, unspecified: Secondary | ICD-10-CM

## 2011-07-09 DIAGNOSIS — R000 Tachycardia, unspecified: Secondary | ICD-10-CM | POA: Diagnosis present

## 2011-07-09 DIAGNOSIS — I059 Rheumatic mitral valve disease, unspecified: Secondary | ICD-10-CM | POA: Diagnosis present

## 2011-07-09 DIAGNOSIS — J438 Other emphysema: Secondary | ICD-10-CM

## 2011-07-09 DIAGNOSIS — J449 Chronic obstructive pulmonary disease, unspecified: Secondary | ICD-10-CM | POA: Diagnosis present

## 2011-07-09 DIAGNOSIS — I6529 Occlusion and stenosis of unspecified carotid artery: Secondary | ICD-10-CM | POA: Diagnosis present

## 2011-07-09 DIAGNOSIS — N390 Urinary tract infection, site not specified: Secondary | ICD-10-CM | POA: Diagnosis present

## 2011-07-09 DIAGNOSIS — I5023 Acute on chronic systolic (congestive) heart failure: Secondary | ICD-10-CM

## 2011-07-09 DIAGNOSIS — I10 Essential (primary) hypertension: Secondary | ICD-10-CM | POA: Diagnosis present

## 2011-07-09 DIAGNOSIS — R6510 Systemic inflammatory response syndrome (SIRS) of non-infectious origin without acute organ dysfunction: Secondary | ICD-10-CM | POA: Diagnosis present

## 2011-07-09 DIAGNOSIS — Z7982 Long term (current) use of aspirin: Secondary | ICD-10-CM

## 2011-07-09 DIAGNOSIS — I708 Atherosclerosis of other arteries: Secondary | ICD-10-CM | POA: Diagnosis present

## 2011-07-09 DIAGNOSIS — J9600 Acute respiratory failure, unspecified whether with hypoxia or hypercapnia: Secondary | ICD-10-CM | POA: Diagnosis present

## 2011-07-09 DIAGNOSIS — I498 Other specified cardiac arrhythmias: Secondary | ICD-10-CM | POA: Diagnosis present

## 2011-07-09 DIAGNOSIS — D72829 Elevated white blood cell count, unspecified: Secondary | ICD-10-CM | POA: Diagnosis present

## 2011-07-09 DIAGNOSIS — R627 Adult failure to thrive: Secondary | ICD-10-CM | POA: Diagnosis present

## 2011-07-09 DIAGNOSIS — I959 Hypotension, unspecified: Secondary | ICD-10-CM | POA: Diagnosis present

## 2011-07-09 DIAGNOSIS — H3530 Unspecified macular degeneration: Secondary | ICD-10-CM | POA: Diagnosis present

## 2011-07-09 DIAGNOSIS — R7989 Other specified abnormal findings of blood chemistry: Secondary | ICD-10-CM | POA: Diagnosis present

## 2011-07-09 DIAGNOSIS — K224 Dyskinesia of esophagus: Secondary | ICD-10-CM | POA: Diagnosis present

## 2011-07-09 DIAGNOSIS — E876 Hypokalemia: Secondary | ICD-10-CM | POA: Diagnosis not present

## 2011-07-09 DIAGNOSIS — I771 Stricture of artery: Secondary | ICD-10-CM | POA: Diagnosis present

## 2011-07-09 DIAGNOSIS — Z87891 Personal history of nicotine dependence: Secondary | ICD-10-CM

## 2011-07-09 DIAGNOSIS — J811 Chronic pulmonary edema: Secondary | ICD-10-CM

## 2011-07-09 DIAGNOSIS — R0602 Shortness of breath: Secondary | ICD-10-CM

## 2011-07-09 HISTORY — DX: Unspecified malignant neoplasm of skin, unspecified: C44.90

## 2011-07-09 HISTORY — DX: Heart failure, unspecified: I50.9

## 2011-07-09 HISTORY — DX: Personal history of urinary (tract) infections: Z87.440

## 2011-07-09 HISTORY — DX: Malignant neoplasm of unspecified site of unspecified female breast: C50.919

## 2011-07-09 LAB — DIFFERENTIAL
Basophils Relative: 1 % (ref 0–1)
Eosinophils Absolute: 0.4 10*3/uL (ref 0.0–0.7)
Eosinophils Relative: 3 % (ref 0–5)
Lymphs Abs: 3.8 10*3/uL (ref 0.7–4.0)
Monocytes Absolute: 0.6 10*3/uL (ref 0.1–1.0)
Monocytes Relative: 5 % (ref 3–12)
Neutrophils Relative %: 60 % (ref 43–77)

## 2011-07-09 LAB — CBC
Hemoglobin: 13.2 g/dL (ref 12.0–15.0)
MCH: 26.3 pg (ref 26.0–34.0)
MCH: 26.3 pg (ref 26.0–34.0)
MCHC: 31.3 g/dL (ref 30.0–36.0)
MCV: 84.2 fL (ref 78.0–100.0)
Platelets: 324 10*3/uL (ref 150–400)
RBC: 5.01 MIL/uL (ref 3.87–5.11)
RBC: 4.71 MIL/uL (ref 3.87–5.11)

## 2011-07-09 LAB — POCT I-STAT, CHEM 8
BUN: 30 mg/dL — ABNORMAL HIGH (ref 6–23)
Calcium, Ion: 1.26 mmol/L (ref 1.12–1.32)
Chloride: 111 mEq/L (ref 96–112)
Creatinine, Ser: 0.9 mg/dL (ref 0.50–1.10)
Glucose, Bld: 188 mg/dL — ABNORMAL HIGH (ref 70–99)
TCO2: 21 mmol/L (ref 0–100)

## 2011-07-09 LAB — URINALYSIS, ROUTINE W REFLEX MICROSCOPIC
Bilirubin Urine: NEGATIVE
Glucose, UA: 100 mg/dL — AB
Ketones, ur: NEGATIVE mg/dL
Leukocytes, UA: NEGATIVE
Protein, ur: >300 mg/dL — AB
pH: 6.5 (ref 5.0–8.0)

## 2011-07-09 LAB — URINE MICROSCOPIC-ADD ON

## 2011-07-09 LAB — PROCALCITONIN: Procalcitonin: 0.28 ng/mL

## 2011-07-09 LAB — LACTIC ACID, PLASMA: Lactic Acid, Venous: 1.4 mmol/L (ref 0.5–2.2)

## 2011-07-09 LAB — TSH: TSH: 0.853 u[IU]/mL (ref 0.350–4.500)

## 2011-07-09 LAB — CARDIAC PANEL(CRET KIN+CKTOT+MB+TROPI)
CK, MB: 4.4 ng/mL — ABNORMAL HIGH (ref 0.3–4.0)
Relative Index: INVALID (ref 0.0–2.5)
Total CK: 48 U/L (ref 7–177)
Total CK: 48 U/L (ref 7–177)

## 2011-07-09 LAB — CREATININE, SERUM: Creatinine, Ser: 0.83 mg/dL (ref 0.50–1.10)

## 2011-07-09 LAB — POCT I-STAT TROPONIN I: Troponin i, poc: 0.03 ng/mL (ref 0.00–0.08)

## 2011-07-09 LAB — HEPARIN LEVEL (UNFRACTIONATED): Heparin Unfractionated: 0.19 IU/mL — ABNORMAL LOW (ref 0.30–0.70)

## 2011-07-09 MED ORDER — IPRATROPIUM BROMIDE 0.02 % IN SOLN
0.5000 mg | Freq: Three times a day (TID) | RESPIRATORY_TRACT | Status: DC
  Administered 2011-07-09 – 2011-07-10 (×3): 0.5 mg via RESPIRATORY_TRACT
  Filled 2011-07-09 (×3): qty 2.5

## 2011-07-09 MED ORDER — LORATADINE 10 MG PO TABS
10.0000 mg | ORAL_TABLET | Freq: Every day | ORAL | Status: DC
  Administered 2011-07-09 – 2011-07-11 (×3): 10 mg via ORAL
  Filled 2011-07-09 (×3): qty 1

## 2011-07-09 MED ORDER — SODIUM CHLORIDE 0.9 % IJ SOLN: 3.0000 mL | INTRAMUSCULAR | Status: DC | PRN

## 2011-07-09 MED ORDER — DEXTROSE 5 % IV SOLN
2.0000 g | Freq: Three times a day (TID) | INTRAVENOUS | Status: DC
  Administered 2011-07-09 – 2011-07-10 (×3): 2 g via INTRAVENOUS
  Filled 2011-07-09 (×5): qty 2

## 2011-07-09 MED ORDER — VANCOMYCIN HCL IN DEXTROSE 1-5 GM/200ML-% IV SOLN
1000.0000 mg | Freq: Once | INTRAVENOUS | Status: AC
  Administered 2011-07-09: 1000 mg via INTRAVENOUS
  Filled 2011-07-09: qty 200

## 2011-07-09 MED ORDER — VITAMIN C 500 MG PO TABS
1000.0000 mg | ORAL_TABLET | Freq: Every day | ORAL | Status: DC
  Administered 2011-07-09 – 2011-07-11 (×3): 1000 mg via ORAL
  Filled 2011-07-09 (×3): qty 2

## 2011-07-09 MED ORDER — VANCOMYCIN HCL 1000 MG IV SOLR
750.0000 mg | INTRAVENOUS | Status: DC
  Administered 2011-07-09: 750 mg via INTRAVENOUS
  Filled 2011-07-09 (×2): qty 750

## 2011-07-09 MED ORDER — ONDANSETRON HCL 4 MG PO TABS: 4.0000 mg | ORAL_TABLET | Freq: Four times a day (QID) | ORAL | Status: DC | PRN

## 2011-07-09 MED ORDER — ASPIRIN 81 MG PO CHEW
324.0000 mg | CHEWABLE_TABLET | Freq: Once | ORAL | Status: AC
  Administered 2011-07-09: 324 mg via ORAL
  Filled 2011-07-09: qty 4

## 2011-07-09 MED ORDER — FERROUS SULFATE 325 (65 FE) MG PO TABS
325.0000 mg | ORAL_TABLET | Freq: Every day | ORAL | Status: DC
  Administered 2011-07-10 – 2011-07-11 (×2): 325 mg via ORAL
  Filled 2011-07-09 (×3): qty 1

## 2011-07-09 MED ORDER — ASPIRIN EC 325 MG PO TBEC
325.0000 mg | DELAYED_RELEASE_TABLET | Freq: Every day | ORAL | Status: DC
  Administered 2011-07-09 – 2011-07-11 (×3): 325 mg via ORAL
  Filled 2011-07-09 (×3): qty 1

## 2011-07-09 MED ORDER — LEVALBUTEROL HCL 0.63 MG/3ML IN NEBU
0.6300 mg | INHALATION_SOLUTION | Freq: Three times a day (TID) | RESPIRATORY_TRACT | Status: AC
  Administered 2011-07-09 (×2): 0.63 mg via RESPIRATORY_TRACT
  Filled 2011-07-09 (×3): qty 3

## 2011-07-09 MED ORDER — SALINE SPRAY 0.65 % NA SOLN: 1.0000 | NASAL | Status: DC | PRN

## 2011-07-09 MED ORDER — SALINE NASAL SPRAY 0.65 % NA SOLN: 1.0000 | NASAL | Status: DC | PRN

## 2011-07-09 MED ORDER — HEPARIN BOLUS VIA INFUSION
2500.0000 [IU] | Freq: Once | INTRAVENOUS | Status: AC
  Administered 2011-07-09: 2500 [IU] via INTRAVENOUS
  Filled 2011-07-09: qty 2500

## 2011-07-09 MED ORDER — LEVALBUTEROL HCL 0.63 MG/3ML IN NEBU
0.6300 mg | INHALATION_SOLUTION | RESPIRATORY_TRACT | Status: DC | PRN
  Filled 2011-07-09: qty 3

## 2011-07-09 MED ORDER — DEXTROSE 5 % IV SOLN: 500.0000 mg | Freq: Once | INTRAVENOUS | Status: DC

## 2011-07-09 MED ORDER — SODIUM CHLORIDE 0.9 % IJ SOLN: 3.0000 mL | Freq: Two times a day (BID) | INTRAMUSCULAR | Status: DC

## 2011-07-09 MED ORDER — PANTOPRAZOLE SODIUM 40 MG PO TBEC
40.0000 mg | DELAYED_RELEASE_TABLET | Freq: Every day | ORAL | Status: DC
  Administered 2011-07-09 – 2011-07-11 (×3): 40 mg via ORAL
  Filled 2011-07-09 (×3): qty 1

## 2011-07-09 MED ORDER — HEPARIN (PORCINE) IN NACL 100-0.45 UNIT/ML-% IJ SOLN
700.0000 [IU]/h | INTRAMUSCULAR | Status: DC
  Administered 2011-07-09: 550 [IU]/h via INTRAVENOUS
  Filled 2011-07-09: qty 250

## 2011-07-09 MED ORDER — HEPARIN BOLUS VIA INFUSION
1500.0000 [IU] | Freq: Once | INTRAVENOUS | Status: AC
  Administered 2011-07-10: 1500 [IU] via INTRAVENOUS
  Filled 2011-07-09: qty 1500

## 2011-07-09 MED ORDER — FUROSEMIDE 10 MG/ML IJ SOLN
40.0000 mg | Freq: Once | INTRAMUSCULAR | Status: AC
  Administered 2011-07-09: 40 mg via INTRAVENOUS
  Filled 2011-07-09: qty 4

## 2011-07-09 MED ORDER — ACETAMINOPHEN 650 MG RE SUPP: 650.0000 mg | Freq: Four times a day (QID) | RECTAL | Status: DC | PRN

## 2011-07-09 MED ORDER — SODIUM CHLORIDE 0.9 % IV SOLN: 250.0000 mL | INTRAVENOUS | Status: DC | PRN

## 2011-07-09 MED ORDER — DEXTROSE 5 % IV SOLN
500.0000 mg | Freq: Three times a day (TID) | INTRAVENOUS | Status: DC
  Filled 2011-07-09 (×2): qty 0.5

## 2011-07-09 MED ORDER — RAMIPRIL 2.5 MG PO CAPS
2.5000 mg | ORAL_CAPSULE | Freq: Every day | ORAL | Status: DC
  Administered 2011-07-10: 2.5 mg via ORAL
  Filled 2011-07-09 (×2): qty 1

## 2011-07-09 MED ORDER — ATORVASTATIN CALCIUM 80 MG PO TABS
80.0000 mg | ORAL_TABLET | Freq: Every day | ORAL | Status: DC
  Administered 2011-07-09 – 2011-07-11 (×3): 80 mg via ORAL
  Filled 2011-07-09 (×3): qty 1

## 2011-07-09 MED ORDER — IPRATROPIUM BROMIDE 0.02 % IN SOLN: 0.5000 mg | Freq: Three times a day (TID) | RESPIRATORY_TRACT | Status: DC

## 2011-07-09 MED ORDER — ACETAMINOPHEN 325 MG PO TABS
650.0000 mg | ORAL_TABLET | Freq: Four times a day (QID) | ORAL | Status: DC | PRN
  Administered 2011-07-09 – 2011-07-10 (×2): 650 mg via ORAL
  Filled 2011-07-09 (×2): qty 2

## 2011-07-09 MED ORDER — METOPROLOL SUCCINATE ER 50 MG PO TB24
50.0000 mg | ORAL_TABLET | Freq: Every day | ORAL | Status: DC
  Administered 2011-07-10: 50 mg via ORAL
  Filled 2011-07-09 (×2): qty 1

## 2011-07-09 MED ORDER — FUROSEMIDE 10 MG/ML IJ SOLN
40.0000 mg | Freq: Two times a day (BID) | INTRAMUSCULAR | Status: DC
  Administered 2011-07-10: 40 mg via INTRAVENOUS
  Filled 2011-07-09 (×4): qty 4

## 2011-07-09 MED ORDER — ONDANSETRON HCL 4 MG/2ML IJ SOLN: 4.0000 mg | Freq: Four times a day (QID) | INTRAMUSCULAR | Status: DC | PRN

## 2011-07-09 MED ORDER — DEXTROSE 5 % IV SOLN
500.0000 mg | Freq: Once | INTRAVENOUS | Status: DC
  Filled 2011-07-09: qty 0.5

## 2011-07-09 MED ORDER — ENOXAPARIN SODIUM 40 MG/0.4ML ~~LOC~~ SOLN
40.0000 mg | SUBCUTANEOUS | Status: DC
  Filled 2011-07-09: qty 0.4
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
## 2011-07-09 NOTE — Progress Notes (Signed)
ANTICOAGULATION CONSULT NOTE   Pharmacy Consult for Heparin Indication: chest pain/ACS  Allergies  Allergen Reactions  . Ciprofloxacin     SOB  . Codeine Nausea Only  . Penicillins Other (See Comments)    unknown  . Sodium Pentobarbital (Pentobarbital Sodium)     Patient Measurements: Height: 5\' 5"  (165.1 cm) Weight: 97 lb 15.9 oz (44.45 kg) IBW/kg (Calculated) : 57   Vital Signs: Temp: 98.2 F (36.8 C) (05/19 2323) Temp src: Oral (05/19 2323) BP: 80/40 mmHg (05/19 1940) Pulse Rate: 115  (05/19 2100)  Labs:  Basename 07/09/11 2247 07/09/11 1958 07/09/11 1200 07/09/11 0634 07/09/11 0630  HGB -- -- 12.4 13.2 --  HCT -- -- 38.7 42.2 44.0  PLT -- -- 324 353 --  APTT -- -- -- -- --  LABPROT -- -- -- -- --  INR -- -- -- -- --  HEPARINUNFRC 0.19* -- -- -- --  CREATININE -- -- 0.83 -- 0.90  CKTOTAL -- 48 48 -- --  CKMB -- 4.4* 5.4* -- --  TROPONINI -- <0.30 0.38* -- --    Estimated Creatinine Clearance: 38.6 ml/min (by C-G formula based on Cr of 0.83).  Assessment: 78 yo female with CHF, elevated cardiac markers, for Heparin   Goal of Therapy:  Heparin level 0.3-0.7 units/ml Monitor platelets by anticoagulation protocol: Yes   Plan: Heparin 1500 units IV bolus, then increase Heparin 700 units/hr Follow-up am labs.  Geannie Risen, PharmD, BCPS   07/09/2011 11:27 PM

## 2011-07-09 NOTE — ED Notes (Signed)
Spoke to Dr. Donna Bernard and was informed that the patient's BP SB has been running on the 70's-80's with current BP of 96/64. Admitting MD was informed that patient is asymptomatic, denies any SOB, no CP. Dr. Donna Bernard was asked if she wants to upgrade her but informed RN to keep her in Stepdown and to continue monitoring her BP and output. Tim from 2900 was informed about the plan of care.

## 2011-07-09 NOTE — ED Notes (Addendum)
Per EMS:  Pt reports difficulty breathing and chest "tightness".  Pt was found at home diaphoretic and unable to communicate due to SOB

## 2011-07-09 NOTE — Progress Notes (Addendum)
per nsg staff unable to auscultate BP even manually - despite using different cuff sizes, but pt mentating well and completely assymptomatic. -I discussed pt with Dr Concepcion Elk in the box and he recommends placing a-line for better BP monitoirn, will follow.  Roanna Epley [facility] [phone]

## 2011-07-09 NOTE — ED Notes (Signed)
Pt noted to be hypotensive, but asymptomatic. Dr. Donna Bernard was paged, will continue to monitor
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
## 2011-07-09 NOTE — Progress Notes (Signed)
Pt. Refused A-line. MD was notified.

## 2011-07-09 NOTE — ED Notes (Signed)
Per EMS, the patient states she has been short of breath x 2 hours.  Crackles and wheezes are noted bilaterally, and the patient has a history of CHF.
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
## 2011-07-10 ENCOUNTER — Encounter (HOSPITAL_COMMUNITY): Payer: Self-pay | Admitting: Cardiology

## 2011-07-10 ENCOUNTER — Inpatient Hospital Stay (HOSPITAL_COMMUNITY): Payer: Medicare Other

## 2011-07-10 DIAGNOSIS — R627 Adult failure to thrive: Secondary | ICD-10-CM | POA: Diagnosis present

## 2011-07-10 DIAGNOSIS — I959 Hypotension, unspecified: Secondary | ICD-10-CM | POA: Diagnosis present

## 2011-07-10 DIAGNOSIS — J438 Other emphysema: Secondary | ICD-10-CM

## 2011-07-10 DIAGNOSIS — E43 Unspecified severe protein-calorie malnutrition: Secondary | ICD-10-CM | POA: Diagnosis present

## 2011-07-10 DIAGNOSIS — I951 Orthostatic hypotension: Secondary | ICD-10-CM

## 2011-07-10 DIAGNOSIS — R000 Tachycardia, unspecified: Secondary | ICD-10-CM | POA: Diagnosis present

## 2011-07-10 DIAGNOSIS — J159 Unspecified bacterial pneumonia: Secondary | ICD-10-CM

## 2011-07-10 DIAGNOSIS — I5023 Acute on chronic systolic (congestive) heart failure: Secondary | ICD-10-CM

## 2011-07-10 DIAGNOSIS — R7989 Other specified abnormal findings of blood chemistry: Secondary | ICD-10-CM | POA: Diagnosis present

## 2011-07-10 DIAGNOSIS — J9601 Acute respiratory failure with hypoxia: Secondary | ICD-10-CM | POA: Diagnosis present

## 2011-07-10 DIAGNOSIS — I771 Stricture of artery: Secondary | ICD-10-CM | POA: Diagnosis present

## 2011-07-10 LAB — CBC
HCT: 37.4 % (ref 36.0–46.0)
Hemoglobin: 11.7 g/dL — ABNORMAL LOW (ref 12.0–15.0)
MCV: 81.8 fL (ref 78.0–100.0)
RBC: 4.57 MIL/uL (ref 3.87–5.11)
WBC: 10.3 10*3/uL (ref 4.0–10.5)

## 2011-07-10 LAB — CARDIAC PANEL(CRET KIN+CKTOT+MB+TROPI): Relative Index: INVALID (ref 0.0–2.5)

## 2011-07-10 LAB — BASIC METABOLIC PANEL
CO2: 25 mEq/L (ref 19–32)
Chloride: 104 mEq/L (ref 96–112)
GFR calc Af Amer: >90 mL/min (ref >90)
Potassium: 3.3 mEq/L — ABNORMAL LOW (ref 3.5–5.1)
Sodium: 140 mEq/L (ref 135–145)

## 2011-07-10 MED ORDER — FOSFOMYCIN TROMETHAMINE 3 G PO PACK
3.0000 g | PACK | Freq: Once | ORAL | Status: AC
  Administered 2011-07-10: 3 g via ORAL
  Filled 2011-07-10: qty 3

## 2011-07-10 MED ORDER — FUROSEMIDE 10 MG/ML IJ SOLN
40.0000 mg | Freq: Every day | INTRAMUSCULAR | Status: DC
  Filled 2011-07-10: qty 4

## 2011-07-10 MED ORDER — BOOST / RESOURCE BREEZE PO LIQD
1.0000 | Freq: Every day | ORAL | Status: DC
  Administered 2011-07-10 – 2011-07-11 (×2): 1 via ORAL

## 2011-07-10 MED ORDER — DEXTROSE 5 % IV SOLN: 1.0000 g | INTRAVENOUS | Status: DC

## 2011-07-10 MED ORDER — POTASSIUM CHLORIDE CRYS ER 20 MEQ PO TBCR
40.0000 meq | EXTENDED_RELEASE_TABLET | Freq: Every day | ORAL | Status: DC
  Administered 2011-07-10 – 2011-07-11 (×2): 40 meq via ORAL
  Filled 2011-07-10 (×2): qty 2

## 2011-07-10 NOTE — Progress Notes (Signed)
[facility] [place] TEAM 1 - Stepdown/ICU TEAM  Subjective: Alert. States feels better than she did earlier today. No more sensation of heart pounding in chest. States she is very hungry.  Objective: Blood pressure 99/61, pulse 117, temperature 97.5 F (36.4 C), temperature source Oral, resp. rate 21, height 5\' 5"  (1.651 m), weight 43.9 kg (96 lb 12.5 oz), SpO2 94.00%.    Intake/Output from previous day: 05/19 0701 - 05/20 0700 In: 309.1 [I.V.:209.1; IV Piggyback:100] Out: 1700 [Urine:1700] Intake/Output this shift: Total I/O In: -  Out: 700 [Urine:700]  General appearance: alert, cooperative, appears stated age, cachectic and no distress Resp: Distant but with faint bibasilar crackles, 2 L nasal cannula oxygen with sats at 97% now has been weaned to room air with sats 92% Cardio: regular with tachycardic rate of 125 beats per minute and sinus rhythm, S1, S2 normal, no murmur, click, rub or gallop GI: soft, non-tender; bowel sounds normal; no masses,  no organomegaly Extremities: extremities normal, atraumatic, no cyanosis or edema Neurologic: Grossly normal  Lab Results:  Basename 07/10/11 0600 07/09/11 1200  WBC 10.3 13.4*  HGB 11.7* 12.4  HCT 37.4 38.7  PLT 304 324   BMET  Basename 07/10/11 0600 07/09/11 1200 07/09/11 0630  NA 140 -- 143  K 3.3* -- 3.5  CL 104 -- 111  CO2 25 -- --  GLUCOSE 96 -- 188*  BUN 26* -- 30*  CREATININE 0.76 0.83 --  CALCIUM 8.8 -- --    Studies/Results: Portable Chest 1 View  07/10/2011  *RADIOLOGY REPORT*  Clinical Data: Shortness of breath.  PORTABLE CHEST - 1 VIEW  Comparison: 07/09/2011  Findings: There is decreased interstitial thickening which may represent decreased edema.  Persistent basilar densities are suggestive for atelectasis and pleural fluid.  There is persistent consolidation at the left lung base.  Heart size is upper limits of normal.  IMPRESSION: Findings suggest decreased interstitial pulmonary edema.   Persistent basilar densities.  Cannot exclude consolidation at the left lung base.  Original Report Authenticated By: Richarda Overlie, M.D.   Dg Chest Portable 1 View  07/09/2011  *RADIOLOGY REPORT*  Clinical Data: Respiratory distress, history of COPD  PORTABLE CHEST - 1 VIEW  Comparison: 12/27/2010; 11/27/2008  Findings: Grossly unchanged enlarged cardiac silhouette and mediastinal contours with atherosclerotic calcifications within the aortic arch.  The lungs remain hyperinflated.  There is persistent blunting of bilateral costophrenic angles, left greater than right suggestive of small bilateral effusions.  Pulmonary vasculature is indistinct with cephalization of flow.  Bibasilar opacities, left greater than right.  No pneumothorax.  Grossly unchanged bones. Right axillary surgical clips.  IMPRESSION: 1.  Overall findings most suggestive of pulmonary edema with small bilateral effusions, left greater than right. 2.  Bibasilar opacities, left greater than right, atelectasis versus infiltrate. A follow-up chest radiograph in 4 to 6 weeks after treatment is recommended to ensure resolution.  3.  Hyperexpanded lungs compatible with emphysema.  Original Report Authenticated By: Waynard Reeds, M.D.    Medications: I have reviewed the patient's current medications.  Assessment/Plan:  Acute on chronic systolic CHF (congestive heart failure), NYHA class 3 *Medications including Lasix dosage had been decreased recently by Dr. Anne Fu because of hypotension at the office *Has diuresed nicely with IV Lasix-so far 2100 cc out *Lasix has been decreased to daily and will continue IV route for at least an additional 24 hours *Continue low-dose ACE inhibitor and beta blocker as BP allows  Acute respiratory failure with hypoxia *  Resolving with treatment of underlying heart failure exacerbation  Hypotension *So far is resolved but need to watch closely as we adjust heart failure medication *Possibly influenced by  underlying subclavian stenosis in the right upper extremity but it is also noted blood pressures were low in the left upper extremity as well  Subclavian artery stenosis, right *Avoid checking blood pressures in the right upper extremity - followed by Dr. Arbie Cookey (Vascular Surg) in outpt setting w/ plan for medical tx only  UTI (lower urinary tract infection)/leukocytosis *Urinalysis consistent with urinary tract infection *Culture pending *Has allergies to Cipro and to penicillin-discussed with pharmacist and will give a one-time dose of fosfomycin  Tachycardia *Likely physiologic in nature and possibly a degree of anxiety *Sinus etiology *We'll continue to monitor especially in setting of recent hypotension and use of diuretics to treat heart failure  Elevated troponin *Only mildly elevated and likely related to recent hypotension and heart failure *EKG not ischemic  Severe protein-calorie malnutrition/ Failure to thrive in adult *Chronic problem likely related to COPD and end-stage heart failure *Allow regular diet  Hypokalemia *Oral replete and follow electrolyte panel  COPD (chronic obstructive pulmonary disease) *No signs of acute exacerbation  Hypertension *Blood pressure controlled on current medications  Disposition *Transfer to telemetry *Dr. Anne Fu discussed with the patient regarding her overall long term prognosis and patient is now agreeable to DO NOT RESUSCITATE status   LOS: 1 day   Junious Silk, ANP pager [phone]  [facility] [email or web address] Password: [email or web address]  07/10/2011, 11:54 AM  I have personally examined this patient and reviewed the entire database. I have reviewed the above note, made any necessary editorial changes, and agree with its content.  Lonia Blood, MD [facility]
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
## 2011-07-10 NOTE — Progress Notes (Signed)
Utilization Review Completed.[name] T5/20/2013   

## 2011-07-10 NOTE — Progress Notes (Signed)
Subjective:  She described to me her breathing when she came into the hospital. Sudden, fast onset, shortness of breath. She recently moved into a smaller place on day of admission.  We had a lengthy discussion about advanced directives/DO NOT RESUSCITATE. When she was in the ambulance coming over to the hospital, she states that the EMS paramedic*she wanted to be intubated and she stated that she did not. I described to her CPR, cardioversion. Given her overall ejection fraction of approximately 35%, frailty, we mutually came upon the decision for DO NOT RESUSCITATE.  Last night, they tried to place a radial artery line without success. She does not wish for second attempt. She has a right subclavian stenosis. Her blood pressure cuff is currently on the left arm and is registering in the 100 systolic.  About a month ago, I saw her in clinic and had extreme difficulty getting her blood pressure at that time even with a Doppler on the left arm. At that time, I discontinued her amlodipine, decreased her Altace and stopped her Lasix. I asked her to watch her weights, symptoms and to take her Lasix as needed.  Based upon this admission, I would like for her to continue with daily home dose Lasix of 40 mg once a day.  Interestingly, no syncope at home, no dizziness, no chest pain, no recent fevers.  Objective:  Vital Signs in the last 24 hours: Temp:  [97.6 F (36.4 C)-98.2 F (36.8 C)] 97.6 F (36.4 C) (05/20 0400) Pulse Rate:  [63-119] 63  (05/20 0400) Resp:  [16-24] 18  (05/20 0400) BP: (68-114)/(35-81) 114/64 mmHg (05/20 0400) SpO2:  [94 %-99 %] 97 % (05/20 0400) Weight:  [43.9 kg (96 lb 12.5 oz)-44.45 kg (97 lb 15.9 oz)] 43.9 kg (96 lb 12.5 oz) (05/20 0500)  Intake/Output from previous day: 05/19 0701 - 05/20 0700 In: 309.1 [I.V.:209.1; IV Piggyback:100] Out: 1700 [Urine:1700]   Physical Exam: General: Thin, elderly, extremely pleasant, mildly increased work of breathing but much  improved she states. Head:  Normocephalic and atraumatic. Lungs: Mild crackles heard throughout. Markedly kyphotic Heart: Tachycardic and regular, heart rate 120.  2/6 systolic murmur at apex, no rubs or gallops.  Difficult to palpate pulses Abdomen: soft, non-tender, positive bowel sounds. Extremities: No clubbing or cyanosis. No edema. Neurologic: Alert and oriented x 3. Nonfocal    Lab Results:  Basename 07/10/11 0600 07/09/11 1200  WBC 10.3 13.4*  HGB 11.7* 12.4  PLT 304 324    Basename 07/10/11 0600 07/09/11 1200 07/09/11 0630  NA 140 -- 143  K 3.3* -- 3.5  CL 104 -- 111  CO2 25 -- --  GLUCOSE 96 -- 188*  BUN 26* -- 30*  CREATININE 0.76 0.83 --    Basename 07/10/11 0600 07/09/11 1958  TROPONINI <0.30 <0.30  Imaging: Portable Chest 1 View  07/10/2011  *RADIOLOGY REPORT*  Clinical Data: Shortness of breath.  PORTABLE CHEST - 1 VIEW  Comparison: 07/09/2011  Findings: There is decreased interstitial thickening which may represent decreased edema.  Persistent basilar densities are suggestive for atelectasis and pleural fluid.  There is persistent consolidation at the left lung base.  Heart size is upper limits of normal.  IMPRESSION: Findings suggest decreased interstitial pulmonary edema.  Persistent basilar densities.  Cannot exclude consolidation at the left lung base.  Original Report Authenticated By: Richarda Overlie, M.D.   Dg Chest Portable 1 View  07/09/2011  *RADIOLOGY REPORT*  Clinical Data: Respiratory distress, history of COPD  PORTABLE CHEST -  1 VIEW  Comparison: 12/27/2010; 11/27/2008  Findings: Grossly unchanged enlarged cardiac silhouette and mediastinal contours with atherosclerotic calcifications within the aortic arch.  The lungs remain hyperinflated.  There is persistent blunting of bilateral costophrenic angles, left greater than right suggestive of small bilateral effusions.  Pulmonary vasculature is indistinct with cephalization of flow.  Bibasilar opacities, left  greater than right.  No pneumothorax.  Grossly unchanged bones. Right axillary surgical clips.  IMPRESSION: 1.  Overall findings most suggestive of pulmonary edema with small bilateral effusions, left greater than right. 2.  Bibasilar opacities, left greater than right, atelectasis versus infiltrate. A follow-up chest radiograph in 4 to 6 weeks after treatment is recommended to ensure resolution.  3.  Hyperexpanded lungs compatible with emphysema.  Original Report Authenticated By: Waynard Reeds, M.D.   Personally viewed.   Telemetry: Sinus tachycardia. Personally viewed.   EKG:  Sinus tachycardia rate 135 with PVCs, no ST segment changes Cardiac Studies:  Echocardiogram from December 2012, ejection fraction 35% range, moderate mitral regurgitation, trace pericardial effusion.  Assessment/Plan:  Principal Problem:  *CHF, acute on chronic Active Problems:  UTI (lower urinary tract infection)  Systemic inflammatory response syndrome (SIRS)  COPD (chronic obstructive pulmonary disease)  Acute on chronic systolic heart failure  - Continue today with IV Lasix 40 mg twice a day. She is much improved after diuresis.  - At home, continue with 40 mg of by mouth Lasix once a day.  - Replete potassium  - Continue with Toprol 50 mg once a day. Sinus tachycardia. Some of her doses may have been held due to hypotension/difficulty to obtain blood pressure  - Continue with low-dose Altace 2.5 mg. If hypotension continues to be an issue, we may need to discontinue.  - EF is 35%.  Moderate mitral regurgitation  - Nonsurgical candidate  Sinus tachycardia  - Continue with metoprolol. Decreased ejection fraction, decreased cardiac output. Careful review of telemetry/EKG does not show underlying atrial flutter.  Severe malnutrition  - She is quite thin. Encourage protein  DO NOT RESUSCITATE  - Discussion at length earlier.  - I would not start pressors  Mildly elevated troponin  - Has returned to  normal. Likely secondary to demand ischemia in the setting of cardiomyopathy/heart failure.  Hypokalemia  - Replete potassium  I also discussed her care with Dr. Sharon Seller and nurse taking care of her. One could consider palliative care team.   Archita Lomeli 07/10/2011, 9:07 AM

## 2011-07-11 DIAGNOSIS — I5023 Acute on chronic systolic (congestive) heart failure: Secondary | ICD-10-CM

## 2011-07-11 DIAGNOSIS — J159 Unspecified bacterial pneumonia: Secondary | ICD-10-CM

## 2011-07-11 DIAGNOSIS — I959 Hypotension, unspecified: Secondary | ICD-10-CM

## 2011-07-11 DIAGNOSIS — J438 Other emphysema: Secondary | ICD-10-CM

## 2011-07-11 LAB — CBC
HCT: 39.4 % (ref 36.0–46.0)
Hemoglobin: 12.4 g/dL (ref 12.0–15.0)
MCH: 26.1 pg (ref 26.0–34.0)
MCHC: 31.5 g/dL (ref 30.0–36.0)
RBC: 4.76 MIL/uL (ref 3.87–5.11)

## 2011-07-11 LAB — BASIC METABOLIC PANEL
BUN: 36 mg/dL — ABNORMAL HIGH (ref 6–23)
Chloride: 106 mEq/L (ref 96–112)
Glucose, Bld: 104 mg/dL — ABNORMAL HIGH (ref 70–99)
Potassium: 4.1 mEq/L (ref 3.5–5.1)
Sodium: 142 mEq/L (ref 135–145)

## 2011-07-11 MED ORDER — FUROSEMIDE 20 MG PO TABS: 20.0000 mg | ORAL_TABLET | Freq: Every day | ORAL | Status: DC

## 2011-07-11 MED ORDER — ATORVASTATIN CALCIUM 80 MG PO TABS: 80.0000 mg | ORAL_TABLET | Freq: Every day | ORAL | Status: DC

## 2011-07-11 MED ORDER — ASPIRIN EC 81 MG PO TBEC: 81.0000 mg | DELAYED_RELEASE_TABLET | Freq: Every day | ORAL | Status: DC

## 2011-07-11 MED ORDER — POTASSIUM CHLORIDE CRYS ER 20 MEQ PO TBCR: 20.0000 meq | EXTENDED_RELEASE_TABLET | Freq: Every day | ORAL | Status: DC

## 2011-07-11 MED ORDER — ATORVASTATIN CALCIUM 80 MG PO TABS: 40.0000 mg | ORAL_TABLET | Freq: Every day | ORAL | Status: DC

## 2011-07-11 NOTE — Discharge Instructions (Signed)
Heart Failure Heart failure (HF) is a condition in which the heart has trouble pumping blood. This means your heart does not pump blood efficiently for your body to work well. In some cases of HF, fluid may back up into your lungs or you may have swelling (edema) in your lower legs. HF is a long-term (chronic) condition. It is important for you to take good care of yourself and follow your caregiver's treatment plan. CAUSES   Health conditions:   High blood pressure (hypertension) causes the heart muscle to work harder than normal. When pressure in the blood vessels is high, the heart needs to pump (contract) with more force in order to circulate blood throughout the body. High blood pressure eventually causes the heart to become stiff and weak.   Coronary artery disease (CAD) is the buildup of cholesterol and fat (plaques) in the arteries of the heart. The blockage in the arteries deprives the heart muscle of oxygen and blood. This can cause chest pain and may lead to a heart attack. High blood pressure can also contribute to CAD.   Heart attack (myocardial infarction) occurs when 1 or more arteries in the heart become blocked. The loss of oxygen damages the muscle tissue of the heart. When this happens, part of the heart muscle dies. The injured tissue does not contract as well and weakens the heart's ability to pump blood.   Abnormal heart valves can cause HF when the heart valves do not open and close properly. This makes the heart muscle pump harder to keep the blood flowing.   Heart muscle disease (cardiomyopathy or myocarditis) is damage to the heart muscle from a variety of causes. These can include drug or alcohol abuse, infections, or unknown reasons. These can increase the risk of HF.   Lung disease makes the heart work harder because the lungs do not work properly. This can cause a strain on the heart leading it to fail.   Diabetes increases the risk of HF. High blood sugar contributes  to high fat (lipid) levels in the blood. Diabetes can also cause slow damage to tiny blood vessels that carry important nutrients to the heart muscle. When the heart does not get enough oxygen and food, it can cause the heart to become weak and stiff. This leads to a heart that does not contract efficiently.   Other diseases can contribute to HF. These include abnormal heart rhythms, thyroid problems, and low blood counts (anemia).   Unhealthy lifestyle habits:   Obesity.   Smoking.   Eating foods high in fat and cholesterol.   Eating or drinking beverages high in salt.   Drug or alcohol abuse.   Lack of exercise.  SYMPTOMS  HF symptoms may vary and can be hard to detect. Symptoms may include:  Shortness of breath with activity, such as climbing stairs.   Persistent cough.   Swelling of the feet, ankles, legs, or abdomen.   Unexplained weight gain.   Difficulty breathing when lying flat.   Waking from sleep because of the need to sit up and get more air.   Rapid heartbeat.   Fatigue and loss of energy.   Feeling lightheaded or close to fainting.  DIAGNOSIS  A diagnosis of HF is based on your history, symptoms, physical examination, and diagnostic tests. Diagnostic tests for HF may include:  EKG.   Chest X-ray.   Blood tests.   Exercise stress test.   Blood oxygen test (arterial blood gas).   Evaluation   by a heart doctor (cardiologist).   Ultrasound evaluation of the heart (echocardiogram).   Heart artery test to look for blockages (angiogram).   Radioactive imaging to look at the heart (radionuclide test).  TREATMENT  Treatment is aimed at managing the symptoms of HF. Medicines, lifestyle changes, or surgical intervention may be necessary to treat HF.  Medicines to help treat HF may include:   Angiotensin-converting enzyme (ACE) inhibitors. These block the effects of a blood protein called angiotensin-converting enzyme. ACE inhibitors relax (dilate) the  blood vessels and help lower blood pressure. This decreases the workload of the heart, slows the progression of HF, and improves symptoms.   Angiotensin receptor blockers (ARBs). These medications work similar to ACE inhibitors. ARBs may be an alternative for people who cannot tolerate an ACE inhibitor.   Aldosterone antagonists. This medication helps get rid of extra fluid from your body. This lowers the volume of blood the heart has to pump.   Water pills (diuretics). Diuretics cause the kidneys to remove salt and water from the blood. The extra fluid is removed by urination. By removing extra fluid from the body, diuretics help lower the workload of the heart and help prevent fluid buildup in the lungs so breathing is easier.   Beta blockers. These prevent the heart from beating too fast and improve heart muscle strength. Beta blockers help maintain a normal heart rate, control blood pressure, and improve HF symptoms.   Digitalis. This increases the force of the heartbeat and may be helpful to people with HF or heart rhythm problems.   Healthy lifestyle changes include:   Stopping smoking.   Eating a healthy diet. Avoid foods high in fat. Avoid foods fried in oil or made with fat. A dietician can help with healthy food choices.   Limiting how much salt you eat.   Limiting alcohol intake to no more than 1 drink per day for women and 2 drinks per day for men. Drinking more than that is harmful to your heart. If your heart has already been damaged by alcohol or you have severe HF, drinking alcohol should be stopped completely.   Exercising as directed by your caregiver.   Surgical treatment for HF may include:   Procedures to open blocked arteries, repair damaged heart valves, or remove damaged heart muscle tissue.   A pacemaker to help heart muscle function and to control certain abnormal heart rhythms.   A defibrillator to possibly prevent sudden cardiac death.  HOME CARE  INSTRUCTIONS   Activity level. Your caregiver can help you determine what type of exercise program may be helpful. It is important to maintain your strength. Pace your physical activity to avoid shortness of breath or chest pain. Rest for 1 hour before and after meals. A cardiac rehabilitation program may be helpful to some people with HF.   Diet. Eat a heart healthy diet. Food choices should be low in saturated fat and cholesterol. Talk to a dietician to learn about heart healthy foods.   Salt intake. When you have HF, you need to limit the amount of salt you eat. Eat less than 1500 milligrams (mg) of salt per day or as recommended by your caregiver.   Weight monitoring. Weigh yourself every day. You should weigh yourself in the morning after you urinate and before you eat breakfast. Wear the same amount of clothing each time you weigh yourself. Record your weight daily. Bring your recorded weights to your clinic visits. Tell your caregiver right away if   you have gained 3 lb/1.4 kg in 1 day, or 5 lb/2.3 kg in a week or whatever amount you were told to report.   Blood pressure monitoring. This should be done as directed by your caregiver. A home blood pressure cuff can be purchased at a drugstore. Record your blood pressure numbers and bring them to your clinic visits. Tell your caregiver if you become dizzy or lightheaded upon standing up.   Smoking. If you are currently a smoker, it is time to quit. Nicotine makes your heart work harder by causing your blood vessels to constrict. Do not use nicotine gum or patches before talking to your caregiver.   Follow up. Be sure to schedule a follow-up visit with your caregiver. Keep all your appointments.  SEEK MEDICAL CARE IF:   Your weight increases by 3 lb/1.4 kg in 1 day or 5 lb/2.3 kg in a week.   You notice increasing shortness of breath that is unusual for you. This may happen during rest, sleep, or with activity.   You cough more than normal,  especially with physical activity.   You notice more swelling in your hands, feet, ankles, or belly (abdomen).   You are unable to sleep because it is hard to breathe.   You cough up bloody mucus (sputum).   You begin to feel "jumping" or "fluttering" sensations (palpitations) in your chest.  SEEK IMMEDIATE MEDICAL CARE IF:   You have severe chest pain or pressure which may include symptoms such as:   Pain or pressure in the arms, neck, jaw, or back.   Feeling sweaty.   Feeling sick to your stomach (nauseous).   Feeling short of breath while at rest.   Having a fast or irregular heartbeat.   You experience stroke symptoms. These symptoms include:   Facial weakness or numbness.   Weakness or numbness in an arm, leg, or on one side of your body.   Blurred vision.   Difficulty talking or thinking.   Dizziness or fainting.   Severe headache.  THESE ARE MEDICAL EMERGENCIES. Do not wait to see if the symptoms go away. Call your local emergency services (911 in [place] DO NOT drive yourself to the hospital. IMPORTANT  Make a list of every medicine, vitamin, or herbal supplement you are taking. Keep the list with you at all times. Show it to your caregiver at every visit. Keep the list up-to-date.   Ask your caregiver or pharmacist to write an explanation of each medicine you are taking. This should include:   Why you are taking it.   The possible side effects.   The best time of day to take it.   Foods to take with it or what foods to avoid.   When to stop taking it.  MAKE SURE YOU:   Understand these instructions.   Will watch your condition.   Will get help right away if you are not doing well or get worse.  Document Released: 02/06/2005 Document Revised: 01/26/2011 Document Reviewed: 05/21/2009 [employer] Patient Information 2012 [employer]. 

## 2011-07-11 NOTE — Progress Notes (Signed)
Pt BP 85/55. MD Notified. Awaiting call back. Will continue to monitor.

## 2011-07-11 NOTE — Discharge Summary (Signed)
DISCHARGE SUMMARY  ZAKYA HALABI  MR#: [id number]  DOB:11/07/1929  Date of Admission: 07/09/2011 Date of Discharge: 07/11/2011  Attending Physician:Jahnasia Tatum  Patient's WGN:[id number] Roseanne Reno, MD, MD  Consults:Treatment Team:  Donato Schultz, MD-  Discharge Diagnoses: Present on Admission:  .Acute on chronic systolic CHF (congestive heart failure), NYHA class 3 .UTI (lower urinary tract infection) .Systemic inflammatory response syndrome (SIRS) .COPD (chronic obstructive pulmonary disease) .Tachycardia .Acute respiratory failure with hypoxia .Hypotension .Subclavian artery stenosis, right .Hypertension .Elevated troponin .Severe protein-calorie malnutrition .Failure to thrive in adult   Initial presentation: The patient is a 78 year old white female with past medical history significant for congestive heart failure followed by Dr. Donato Schultz, hypertension, peripheral artery disease status post stents in the past, COPD, who presents with above complaints. She states that her Lasix was discontinued about 3-4 weeks ago, and since then she's had intermittent shortness of breath, two-pillow orthopnea, and ankle edema. Overnight she developed a worsening shortness of breath-she states at that she felt like she was going to die because she was unable to catch her breath. She also had PND. She states that she had a left chest discomfort, vague lasted about 30 minutes and she attributes it mostly to the stress of not being able to breathe. She admits to diaphoresis and nausea but no vomiting. She admits to a cough occasionally productive of phlegm. She denies fevers.  She was seen in the ED and a chest x-ray was done and it showed findings suggestive of pulmonary edema with small bilateral effusions left greater than right. Bibasilar opacities left greater than right atelectasis versus infiltrate and hyperexpanded lungs compatible with emphysema. Her pro brain natruretic peptide  was done and was elevated at 10,583. Patient states that she had an echocardiogram done at a doctor's [facility] office sometime this year but the report is not available on Epic at this time.  Per EDP patient initially required BiPAP and was noted to be hypotensive & tachycardic, after a dose of Lasix in the ED or shortness of breath improved and she was taken off the BiPAP to nasal cannula O2 at 6 L, and her blood pressures improved to the low 100s as well as the tachycardia. Admission to the hospitalist service was requested per ED. She was also started on empiric antibiotics for probable HAP in the ED.   Hospital Course: Acute on chronic systolic CHF (congestive heart failure), NYHA class 3  *Medications including Lasix dosage had been decreased recently by Dr. Anne Fu because of hypotension at the office  *Has diuresed nicely with IV Lasix-. IV lasix was changed to po .  *Continue with beta blocker.  Cardiology recommended to follow up on Tuesday.  Acute respiratory failure with hypoxia  *Resolving with treatment of underlying heart failure exacerbation . Hypotension  *So far is resolved but need to watch closely as we adjust heart failure medication  *Possibly influenced by underlying subclavian stenosis in the right upper extremity but it is also noted blood pressures were low in the left upper extremity as well .  Subclavian artery stenosis, right  *Avoid checking blood pressures in the right upper extremity - followed by Dr. Arbie Cookey (Vascular Surg) in outpt setting w/ plan for medical tx only  UTI (lower urinary tract infection)/leukocytosis  *Urinalysis consistent with urinary tract infection  *Has allergies to Cipro and to penicillin-discussed with pharmacist and was given a one-time dose of fosfomycin  Tachycardia  *Likely physiologic in nature and possibly a degree of anxiety  *Sinus  etiology   Elevated troponin  *Only mildly elevated and likely related to recent hypotension and heart  failure  *EKG not ischemic  Severe protein-calorie malnutrition/ Failure to thrive in adult  *Chronic problem likely related to COPD and end-stage heart failure  Regular diet Hypokalemia  *Repleted. COPD (chronic obstructive pulmonary disease)  *No signs of acute exacerbation  Hypertension  *Blood pressure controlled on current medications      Medication List  As of 07/11/2011  3:46 PM   STOP taking these medications         amLODipine 5 MG tablet      ramipril 2.5 MG capsule      sodium chloride 0.65 % nasal spray         TAKE these medications         aspirin 325 MG EC tablet   Take 325 mg by mouth daily.      atorvastatin 80 MG tablet   Commonly known as: LIPITOR   Take 0.5 tablets (40 mg total) by mouth daily at 6 PM.      CALCIUM 600 + D PO   Take 1 tablet by mouth daily.      cetirizine 10 MG tablet   Commonly known as: ZYRTEC   Take 10 mg by mouth daily.      ferrous sulfate 325 (65 FE) MG tablet   Take 325 mg by mouth daily with breakfast.      furosemide 20 MG tablet   Commonly known as: LASIX   Take 1 tablet (20 mg total) by mouth daily.      ICAPS Caps   Take 2 capsules by mouth as needed.      KRILL OIL PO   Take 1 capsule by mouth daily.      metoprolol succinate 50 MG 24 hr tablet   Commonly known as: TOPROL-XL   Take 50 mg by mouth daily. Take with or immediately following a meal.      omeprazole 20 MG capsule   Commonly known as: PRILOSEC   Take 20 mg by mouth daily.      potassium chloride SA 20 MEQ tablet   Commonly known as: K-DUR,KLOR-CON   Take 1 tablet (20 mEq total) by mouth daily.      vitamin C 1000 MG tablet   Take 1,000 mg by mouth daily.             Day of Discharge BP 109/71  Pulse 107  Temp(Src) 98.3 F (36.8 C) (Oral)  Resp 18  Ht 5\' 5"  (1.651 m)  Wt 44.906 kg (99 lb)  BMI 16.47 kg/m2  SpO2 94%  Physical Exam:  General appearance: alert, cooperative, appears stated age, cachectic and no distress    Resp: Distant but with faint bibasilar crackles, 2 L nasal cannula oxygen with sats at 97% now has been weaned to room air with sats 92%  Cardio: regular with tachycardic rate of 125 beats per minute and sinus rhythm, S1, S2 normal, no murmur, click, rub or gallop  GI: soft, non-tender; bowel sounds normal; no masses, no organomegaly  Extremities: extremities normal, atraumatic, no cyanosis or edema  Neurologic: Grossly normal  Results for orders placed during the hospital encounter of 07/09/11 (from the past 24 hour(s))  BASIC METABOLIC PANEL     Status: Abnormal   Collection Time   07/11/11  4:40 AM      Component Value Range   Sodium 142  135 - 145 (mEq/L)  Potassium 4.1  3.5 - 5.1 (mEq/L)   Chloride 106  96 - 112 (mEq/L)   CO2 27  19 - 32 (mEq/L)   Glucose, Bld 104 (*) 70 - 99 (mg/dL)   BUN 36 (*) 6 - 23 (mg/dL)   Creatinine, Ser 1.61  0.50 - 1.10 (mg/dL)   Calcium 9.4  8.4 - 09.6 (mg/dL)   GFR calc non Af Amer 57 (*) >90 (mL/min)   GFR calc Af Amer 66 (*) >90 (mL/min)  CBC     Status: Abnormal   Collection Time   07/11/11  4:40 AM      Component Value Range   WBC 10.8 (*) 4.0 - 10.5 (K/uL)   RBC 4.76  3.87 - 5.11 (MIL/uL)   Hemoglobin 12.4  12.0 - 15.0 (g/dL)   HCT 04.5  40.9 - 81.1 (%)   MCV 82.8  78.0 - 100.0 (fL)   MCH 26.1  26.0 - 34.0 (pg)   MCHC 31.5  30.0 - 36.0 (g/dL)   RDW 91.4  78.2 - 95.6 (%)   Platelets 318  150 - 400 (K/uL)    Disposition: Home   Follow-up Appts: Discharge Orders    Future Appointments: Provider: Department: Dept Phone: Center:   08/22/2011 1:00 PM [facility] [phone] [facility]   08/22/2011 1:30 PM Larina Earthly, MD [facility] [phone] [facility]   05/03/2012 10:00 AM [facility] [phone] [facility]   05/03/2012 11:00 AM Evern Bio, NP [facility] [phone] [facility]     Future Orders Please Complete By Expires   Diet - low sodium heart healthy      Discharge instructions      Comments:   Change in  medications. Stop altace, change lasix to 20mg  daily with potassium supplementation, and check BMP in one week.  Follow up with Dr Anne Fu in one week next Tuesday at 9 30 am.     Activity as tolerated - No restrictions         Follow-up Information    Follow up with FERGUSON,CYNTHIA A, NP on 07/18/2011. (930 am)    Contact information:   [facility] [address] [place] [address] [phone]             Time spent in discharge (includes decision making & examination of pt): 60 minutes  Signed: Zailee Vallely 07/11/2011, 3:47 PM

## 2011-07-11 NOTE — Evaluation (Signed)
Physical Therapy Evaluation Patient Details Name: [name] MRN: [id number] DOB: 12/21/1929 Today's Date: 07/11/2011 Time: 2130-8657 PT Time Calculation (min): 19 min  PT Assessment / Plan / Recommendation Clinical Impression  Pt admitted with tachycardia and heart failure who is currently at baseline mobility level. Pt and spouse just moved recently to be closer to [place]gtr and have been managing with all ADLs without difficulty. Pt able to perform long hall ambulation well and no LOB. Pt with HR up to 140 with gait asymptomatic and otherwise stable with all mobility. Pt agreeable to no further needs.     PT Assessment  Patent does not need any further PT services    Follow Up Recommendations  No PT follow up    Barriers to Discharge        lEquipment Recommendations  None recommended by PT    Recommendations for Other Services     Frequency      Precautions / Restrictions Precautions Precautions: None   Pertinent Vitals/Pain No pain HR110 at rest and up to 140 with hall ambulation      Mobility  Bed Mobility Bed Mobility: Supine to Sit Supine to Sit: 6: Modified independent (Device/Increase time);HOB flat Transfers Transfers: Sit to Stand;Stand to Sit Sit to Stand: From bed;6: Modified independent (Device/Increase time) Stand to Sit: To chair/3-in-1;With armrests;6: Modified independent (Device/Increase time) Ambulation/Gait Ambulation/Gait Assistance: 6: Modified independent (Device/Increase time) Ambulation Distance (Feet): 300 Feet Assistive device: None Ambulation/Gait Assistance Details: pt able to turn head and change direction without LOB Gait Pattern: Within Functional Limits;Decreased stride length Stairs: Yes Stairs Assistance: 6: Modified independent (Device/Increase time) Stair Management Technique: One rail Left Number of Stairs: 3     Exercises     PT Diagnosis:    PT Problem List:   PT Treatment Interventions:     PT Goals    Visit  Information  Last PT Received On: 07/11/11 Assistance Needed: +1    Subjective Data  Subjective: oh, well this was wonderful thank you for coming Patient Stated Goal: go home   Prior Functioning  Home Living Lives With: Spouse Type of Home: House Home Access: Stairs to enter Secretary/administrator of Steps: 2 Home Layout: One level Bathroom Shower/Tub: Health visitor: Standard Home Adaptive Equipment: None Prior Function Level of Independence: Independent Able to Take Stairs?: Yes Driving: No Vocation: Retired Comments: pt and spouse have been sharing housework and no reports of falls in the last year Communication Communication: No difficulties    Cognition  Overall Cognitive Status: Appears within functional limits for tasks assessed/performed Arousal/Alertness: Awake/alert Orientation Level: Appears intact for tasks assessed Behavior During Session: [facility] for tasks performed    Extremity/Trunk Assessment Right Upper Extremity Assessment RUE ROM/Strength/Tone: [facility] for tasks assessed Left Upper Extremity Assessment LUE ROM/Strength/Tone: [facility] for tasks assessed Right Lower Extremity Assessment RLE ROM/Strength/Tone: [facility] for tasks assessed Left Lower Extremity Assessment LLE ROM/Strength/Tone: [facility] for tasks assessed   Balance Balance Balance Assessed: Yes Dynamic Sitting Balance Dynamic Sitting - Balance Support: No upper extremity supported;Feet supported Dynamic Sitting - Level of Assistance: 7: Independent  End of Session PT - End of Session Equipment Utilized During Treatment: Gait belt Activity Tolerance: Patient tolerated treatment well Patient left: in chair;with call bell/phone within reach Nurse Communication: Mobility status   [name] 07/11/2011, 4:30 PM  [name], PT [phone]

## 2011-07-11 NOTE — Progress Notes (Signed)
Subjective:  78 year old with EF 35%, persistent tachycardia, with recent acute systolic heart failure episode. Now feeling in her usual state of health.   No significant SOB, CP. Felt a little dizzy this am but better after eating breakfast.   Has chronic problems attaining BP in both arms. This was seen in clinic last month.   Objective:  Vital Signs in the last 24 hours: Temp:  [97.5 F (36.4 C)-98.6 F (37 C)] 98.6 F (37 C) (05/21 0630) Pulse Rate:  [76-117] 76  (05/21 0630) Resp:  [18-21] 18  (05/21 0630) BP: (85-115)/(30-70) 115/67 mmHg (05/21 1024) SpO2:  [94 %-98 %] 98 % (05/21 0630) Weight:  [44.906 kg (99 lb)] 44.906 kg (99 lb) (05/21 0430)  Intake/Output from previous day: 05/20 0701 - 05/21 0700 In: -  Out: 700 [Urine:700]   Physical Exam: General: Thin, pleasant Head:  Normocephalic and atraumatic. Lungs: Decreased BS RLL otherwise clear to auscultation . Heart: Tachy RR (ST).  No murmur, rubs or gallops.  Pulses: Pulses normal in all 4 extremities. Abdomen: soft, non-tender, positive bowel sounds. Extremities: No clubbing or cyanosis. No edema. Neurologic: Alert and oriented x 3.    Lab Results:  Basename 07/11/11 0440 07/10/11 0600  WBC 10.8* 10.3  HGB 12.4 11.7*  PLT 318 304    Basename 07/11/11 0440 07/10/11 0600  NA 142 140  K 4.1 3.3*  CL 106 104  CO2 27 25  GLUCOSE 104* 96  BUN 36* 26*  CREATININE 0.93 0.76    Basename 07/10/11 0600 07/09/11 1958  TROPONINI <0.30 <0.30   Hepatic Function Panel  No results found for this basename: CHOL in the last 72 hours No results found for this basename: PROTIME in the last 72 hours  Imaging: Portable Chest 1 View  07/10/2011  *RADIOLOGY REPORT*  Clinical Data: Shortness of breath.  PORTABLE CHEST - 1 VIEW  Comparison: 07/09/2011  Findings: There is decreased interstitial thickening which may represent decreased edema.  Persistent basilar densities are suggestive for atelectasis and pleural  fluid.  There is persistent consolidation at the left lung base.  Heart size is upper limits of normal.  IMPRESSION: Findings suggest decreased interstitial pulmonary edema.  Persistent basilar densities.  Cannot exclude consolidation at the left lung base.  Original Report Authenticated By: Richarda Overlie, M.D.   Personally viewed.   Telemetry: Sinus tachy Personally viewed.     Assessment/Plan:  Principal Problem:  *Acute on chronic systolic CHF (congestive heart failure), NYHA class 3 Active Problems:  UTI (lower urinary tract infection)  Systemic inflammatory response syndrome (SIRS)  COPD (chronic obstructive pulmonary disease)  Hypertension  Tachycardia  Acute respiratory failure with hypoxia  Hypotension  Subclavian artery stenosis, right  Elevated troponin  Severe protein-calorie malnutrition  Failure to thrive in adult    - I am stopping altace 2.5mg    - No longer taking amlodipine.  - Continue metoprolol 50mg  QD  - Stopping lasix 40 IV BID  - OK to restart PO lasix 20mg  tomorrow (can be done at home if discharged).   - DNR  - Hypotension (asymptomatic currently)    - I am comfortable allowing her to be discharged with close follow up. Have appt set up next Tuesday at 930am.   Gracin Soohoo 07/11/2011, 11:51 AM

## 2011-07-12 LAB — URINE CULTURE: Culture  Setup Time: 201305201306

## 2011-07-18 ENCOUNTER — Encounter (HOSPITAL_COMMUNITY): Payer: Self-pay | Admitting: *Deleted

## 2011-07-18 ENCOUNTER — Inpatient Hospital Stay (HOSPITAL_COMMUNITY)
Admission: EM | Admit: 2011-07-18 | Discharge: 2011-07-26 | DRG: 291 | Disposition: A | Discharge status: Home / Self Care | Payer: Medicare Other | Attending: Internal Medicine | Admitting: Internal Medicine

## 2011-07-18 ENCOUNTER — Emergency Department (HOSPITAL_COMMUNITY): Payer: Medicare Other

## 2011-07-18 DIAGNOSIS — Z853 Personal history of malignant neoplasm of breast: Secondary | ICD-10-CM

## 2011-07-18 DIAGNOSIS — I5023 Acute on chronic systolic (congestive) heart failure: Principal | ICD-10-CM | POA: Diagnosis present

## 2011-07-18 DIAGNOSIS — I70219 Atherosclerosis of native arteries of extremities with intermittent claudication, unspecified extremity: Secondary | ICD-10-CM | POA: Diagnosis present

## 2011-07-18 DIAGNOSIS — R748 Abnormal levels of other serum enzymes: Secondary | ICD-10-CM | POA: Diagnosis present

## 2011-07-18 DIAGNOSIS — I70209 Unspecified atherosclerosis of native arteries of extremities, unspecified extremity: Secondary | ICD-10-CM

## 2011-07-18 DIAGNOSIS — J438 Other emphysema: Secondary | ICD-10-CM

## 2011-07-18 DIAGNOSIS — I4891 Unspecified atrial fibrillation: Secondary | ICD-10-CM | POA: Diagnosis present

## 2011-07-18 DIAGNOSIS — Z681 Body mass index (BMI) 19 or less, adult: Secondary | ICD-10-CM

## 2011-07-18 DIAGNOSIS — I959 Hypotension, unspecified: Secondary | ICD-10-CM | POA: Diagnosis present

## 2011-07-18 DIAGNOSIS — I509 Heart failure, unspecified: Secondary | ICD-10-CM | POA: Diagnosis present

## 2011-07-18 DIAGNOSIS — I10 Essential (primary) hypertension: Secondary | ICD-10-CM | POA: Diagnosis present

## 2011-07-18 DIAGNOSIS — I771 Stricture of artery: Secondary | ICD-10-CM

## 2011-07-18 DIAGNOSIS — M629 Disorder of muscle, unspecified: Secondary | ICD-10-CM

## 2011-07-18 DIAGNOSIS — R7309 Other abnormal glucose: Secondary | ICD-10-CM | POA: Diagnosis present

## 2011-07-18 DIAGNOSIS — R000 Tachycardia, unspecified: Secondary | ICD-10-CM

## 2011-07-18 DIAGNOSIS — IMO0002 Reserved for concepts with insufficient information to code with codable children: Secondary | ICD-10-CM

## 2011-07-18 DIAGNOSIS — J4489 Other specified chronic obstructive pulmonary disease: Secondary | ICD-10-CM | POA: Diagnosis present

## 2011-07-18 DIAGNOSIS — R627 Adult failure to thrive: Secondary | ICD-10-CM | POA: Diagnosis present

## 2011-07-18 DIAGNOSIS — I999 Unspecified disorder of circulatory system: Secondary | ICD-10-CM

## 2011-07-18 DIAGNOSIS — E43 Unspecified severe protein-calorie malnutrition: Secondary | ICD-10-CM | POA: Diagnosis present

## 2011-07-18 DIAGNOSIS — J9601 Acute respiratory failure with hypoxia: Secondary | ICD-10-CM

## 2011-07-18 DIAGNOSIS — J449 Chronic obstructive pulmonary disease, unspecified: Secondary | ICD-10-CM

## 2011-07-18 DIAGNOSIS — J9620 Acute and chronic respiratory failure, unspecified whether with hypoxia or hypercapnia: Secondary | ICD-10-CM | POA: Diagnosis present

## 2011-07-18 DIAGNOSIS — N390 Urinary tract infection, site not specified: Secondary | ICD-10-CM

## 2011-07-18 DIAGNOSIS — Z66 Do not resuscitate: Secondary | ICD-10-CM | POA: Diagnosis present

## 2011-07-18 HISTORY — DX: Reserved for concepts with insufficient information to code with codable children: IMO0002

## 2011-07-18 LAB — CBC
HCT: 42.7 % (ref 36.0–46.0)
Hemoglobin: 13.3 g/dL (ref 12.0–15.0)
Hemoglobin: 13.6 g/dL (ref 12.0–15.0)
MCH: 26 pg (ref 26.0–34.0)
MCHC: 31.1 g/dL (ref 30.0–36.0)
MCV: 83.9 fL (ref 78.0–100.0)
RBC: 5.23 MIL/uL — ABNORMAL HIGH (ref 3.87–5.11)

## 2011-07-18 LAB — URINALYSIS, ROUTINE W REFLEX MICROSCOPIC
Glucose, UA: NEGATIVE mg/dL
Leukocytes, UA: NEGATIVE
pH: 6.5 (ref 5.0–8.0)

## 2011-07-18 LAB — DIFFERENTIAL
Basophils Relative: 0 % (ref 0–1)
Eosinophils Absolute: 0.3 10*3/uL (ref 0.0–0.7)
Lymphocytes Relative: 38 % (ref 12–46)
Lymphs Abs: 5.4 10*3/uL — ABNORMAL HIGH (ref 0.7–4.0)
Neutro Abs: 7.4 10*3/uL (ref 1.7–7.7)

## 2011-07-18 LAB — BLOOD GAS, ARTERIAL
Acid-base deficit: 7.5 mmol/L — ABNORMAL HIGH (ref 0.0–2.0)
FIO2: 1 %
Mode: POSITIVE
TCO2: 18 mmol/L (ref 0–100)
pCO2 arterial: 46.8 mmHg — ABNORMAL HIGH (ref 35.0–45.0)
pO2, Arterial: 139 mmHg — ABNORMAL HIGH (ref 80.0–100.0)

## 2011-07-18 LAB — GLUCOSE, CAPILLARY: Glucose-Capillary: 171 mg/dL — ABNORMAL HIGH (ref 70–99)

## 2011-07-18 LAB — BASIC METABOLIC PANEL
CO2: 22 mEq/L (ref 19–32)
Chloride: 99 mEq/L (ref 96–112)
Potassium: 3.8 mEq/L (ref 3.5–5.1)
Sodium: 137 mEq/L (ref 135–145)

## 2011-07-18 LAB — PRO B NATRIURETIC PEPTIDE: Pro B Natriuretic peptide (BNP): 12510 pg/mL — ABNORMAL HIGH (ref 0–450)

## 2011-07-18 LAB — CARDIAC PANEL(CRET KIN+CKTOT+MB+TROPI)
CK, MB: 3.2 ng/mL (ref 0.3–4.0)
CK, MB: 5.5 ng/mL — ABNORMAL HIGH (ref 0.3–4.0)
Relative Index: INVALID (ref 0.0–2.5)
Troponin I: 0.46 ng/mL (ref <0.30)
Troponin I: <0.3 ng/mL (ref <0.30)
Troponin I: <0.3 ng/mL (ref <0.30)

## 2011-07-18 LAB — URINE MICROSCOPIC-ADD ON

## 2011-07-18 LAB — LACTIC ACID, PLASMA: Lactic Acid, Venous: 5.1 mmol/L — ABNORMAL HIGH (ref 0.5–2.2)

## 2011-07-18 LAB — CREATININE, SERUM: GFR calc non Af Amer: 53 mL/min — ABNORMAL LOW (ref >90)

## 2011-07-18 LAB — TSH: TSH: 1.452 u[IU]/mL (ref 0.350–4.500)

## 2011-07-18 MED ORDER — METHYLPREDNISOLONE SODIUM SUCC 125 MG IJ SOLR
125.0000 mg | Freq: Once | INTRAMUSCULAR | Status: AC
  Administered 2011-07-18: 125 mg via INTRAVENOUS
  Filled 2011-07-18: qty 2

## 2011-07-18 MED ORDER — ALBUTEROL SULFATE (5 MG/ML) 0.5% IN NEBU
5.0000 mg | INHALATION_SOLUTION | Freq: Once | RESPIRATORY_TRACT | Status: AC
Start: 2011-07-18 — End: 2011-07-18
  Administered 2011-07-18: 5 mg via RESPIRATORY_TRACT
  Filled 2011-07-18: qty 1

## 2011-07-18 MED ORDER — SODIUM CHLORIDE 0.9 % IV SOLN
250.0000 mL | INTRAVENOUS | Status: DC | PRN
  Administered 2011-07-21: 250 mL via INTRAVENOUS

## 2011-07-18 MED ORDER — LORATADINE 10 MG PO TABS
10.0000 mg | ORAL_TABLET | Freq: Every day | ORAL | Status: DC
  Administered 2011-07-18 – 2011-07-26 (×9): 10 mg via ORAL
  Filled 2011-07-18 (×10): qty 1

## 2011-07-18 MED ORDER — ASPIRIN EC 325 MG PO TBEC
325.0000 mg | DELAYED_RELEASE_TABLET | Freq: Every day | ORAL | Status: DC
  Administered 2011-07-18 – 2011-07-26 (×9): 325 mg via ORAL
  Filled 2011-07-18 (×11): qty 1

## 2011-07-18 MED ORDER — ONDANSETRON HCL 4 MG PO TABS
4.0000 mg | ORAL_TABLET | Freq: Four times a day (QID) | ORAL | Status: DC | PRN
  Administered 2011-07-21: 4 mg via ORAL
  Filled 2011-07-18: qty 1

## 2011-07-18 MED ORDER — ALBUTEROL SULFATE (5 MG/ML) 0.5% IN NEBU
2.5000 mg | INHALATION_SOLUTION | Freq: Four times a day (QID) | RESPIRATORY_TRACT | Status: DC
  Filled 2011-07-18: qty 0.5

## 2011-07-18 MED ORDER — POTASSIUM CHLORIDE CRYS ER 20 MEQ PO TBCR
20.0000 meq | EXTENDED_RELEASE_TABLET | Freq: Every day | ORAL | Status: DC
  Administered 2011-07-18 – 2011-07-23 (×6): 20 meq via ORAL
  Filled 2011-07-18 (×7): qty 1

## 2011-07-18 MED ORDER — DOCUSATE SODIUM 100 MG PO CAPS
100.0000 mg | ORAL_CAPSULE | Freq: Two times a day (BID) | ORAL | Status: DC
  Administered 2011-07-18 – 2011-07-26 (×17): 100 mg via ORAL
  Filled 2011-07-18 (×19): qty 1

## 2011-07-18 MED ORDER — ENOXAPARIN SODIUM 30 MG/0.3ML ~~LOC~~ SOLN
30.0000 mg | SUBCUTANEOUS | Status: DC
  Administered 2011-07-18 – 2011-07-20 (×3): 30 mg via SUBCUTANEOUS
  Filled 2011-07-18 (×7): qty 0.3

## 2011-07-18 MED ORDER — FERROUS SULFATE 325 (65 FE) MG PO TABS
325.0000 mg | ORAL_TABLET | Freq: Every day | ORAL | Status: DC
  Administered 2011-07-19 – 2011-07-26 (×8): 325 mg via ORAL
  Filled 2011-07-18 (×12): qty 1

## 2011-07-18 MED ORDER — FUROSEMIDE 20 MG PO TABS
20.0000 mg | ORAL_TABLET | Freq: Every day | ORAL | Status: DC
  Administered 2011-07-18: 20 mg via ORAL
  Filled 2011-07-18 (×2): qty 1

## 2011-07-18 MED ORDER — VITAMIN C 500 MG PO TABS
1000.0000 mg | ORAL_TABLET | Freq: Every day | ORAL | Status: DC
  Administered 2011-07-18 – 2011-07-26 (×9): 1000 mg via ORAL
  Filled 2011-07-18 (×10): qty 2

## 2011-07-18 MED ORDER — ATORVASTATIN CALCIUM 40 MG PO TABS
40.0000 mg | ORAL_TABLET | Freq: Every day | ORAL | Status: DC
  Administered 2011-07-18 – 2011-07-25 (×8): 40 mg via ORAL
  Filled 2011-07-18 (×11): qty 1

## 2011-07-18 MED ORDER — ONDANSETRON HCL 4 MG/2ML IJ SOLN
4.0000 mg | Freq: Once | INTRAMUSCULAR | Status: AC
  Administered 2011-07-18: 4 mg via INTRAVENOUS
  Filled 2011-07-18: qty 2

## 2011-07-18 MED ORDER — ENOXAPARIN SODIUM 40 MG/0.4ML ~~LOC~~ SOLN: 40.0000 mg | SUBCUTANEOUS | Status: DC

## 2011-07-18 MED ORDER — SODIUM CHLORIDE 0.9 % IJ SOLN: 3.0000 mL | INTRAMUSCULAR | Status: DC | PRN

## 2011-07-18 MED ORDER — FUROSEMIDE 10 MG/ML IJ SOLN
40.0000 mg | Freq: Once | INTRAMUSCULAR | Status: AC
Start: 2011-07-18 — End: 2011-07-18
  Administered 2011-07-18: 40 mg via INTRAVENOUS
  Filled 2011-07-18: qty 4

## 2011-07-18 MED ORDER — ALBUTEROL SULFATE (5 MG/ML) 0.5% IN NEBU: 2.5000 mg | INHALATION_SOLUTION | RESPIRATORY_TRACT | Status: DC | PRN

## 2011-07-18 MED ORDER — PANTOPRAZOLE SODIUM 40 MG PO TBEC
40.0000 mg | DELAYED_RELEASE_TABLET | Freq: Every day | ORAL | Status: DC
  Administered 2011-07-18 – 2011-07-26 (×9): 40 mg via ORAL
  Filled 2011-07-18 (×12): qty 1

## 2011-07-18 MED ORDER — SODIUM CHLORIDE 0.9 % IJ SOLN
3.0000 mL | Freq: Two times a day (BID) | INTRAMUSCULAR | Status: DC
  Administered 2011-07-19 – 2011-07-23 (×5): 3 mL via INTRAVENOUS

## 2011-07-18 MED ORDER — SODIUM CHLORIDE 0.9 % IJ SOLN
3.0000 mL | Freq: Two times a day (BID) | INTRAMUSCULAR | Status: DC
  Administered 2011-07-18 – 2011-07-19 (×3): 3 mL via INTRAVENOUS
  Administered 2011-07-20: 10 mL via INTRAVENOUS
  Administered 2011-07-21 – 2011-07-26 (×7): 3 mL via INTRAVENOUS

## 2011-07-18 MED ORDER — ALBUTEROL SULFATE (5 MG/ML) 0.5% IN NEBU
2.5000 mg | INHALATION_SOLUTION | RESPIRATORY_TRACT | Status: DC
  Administered 2011-07-18: 2.5 mg via RESPIRATORY_TRACT
  Filled 2011-07-18: qty 0.5

## 2011-07-18 MED ORDER — ONDANSETRON HCL 4 MG/2ML IJ SOLN
4.0000 mg | Freq: Four times a day (QID) | INTRAMUSCULAR | Status: DC | PRN
  Administered 2011-07-19 – 2011-07-25 (×5): 4 mg via INTRAVENOUS
  Filled 2011-07-18 (×5): qty 2

## 2011-07-18 MED ORDER — NITROGLYCERIN 2 % TD OINT
1.0000 [in_us] | TOPICAL_OINTMENT | Freq: Once | TRANSDERMAL | Status: AC
  Administered 2011-07-18: 1 [in_us] via TOPICAL
  Filled 2011-07-18: qty 30

## 2011-07-18 MED ORDER — ALBUTEROL SULFATE (5 MG/ML) 0.5% IN NEBU: 2.5000 mg | INHALATION_SOLUTION | Freq: Four times a day (QID) | RESPIRATORY_TRACT | Status: DC

## 2011-07-18 MED ORDER — DEXTROSE 5 % IV SOLN
500.0000 mg | INTRAVENOUS | Status: DC
  Administered 2011-07-18 – 2011-07-20 (×3): 500 mg via INTRAVENOUS
  Filled 2011-07-18 (×3): qty 500
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
## 2011-07-18 NOTE — Progress Notes (Signed)
Patient was admitted earlier today. H&P reviewed.  Patient feels better. Denies CP. Breathing is better.  Vitals reviewed. Lungs reveal good air entry bilaterally. Few crackles at bases. S1S2 normal. No edema Abdomen is soft. No focal deficits  Patient admitted with pulmonary edema. Seems to have improved with diuretics. Continue current treatment. Will notify Dr. Anne Fu.  Apparently she was taken off of her Lasix last week. Unclear why she is on antibiotics. Will leave it on for now. Repeat CXR in AM. Admitting mentioned Palliative care. Will prefer this comes from her cardiologist.  She does have chronic low BP according to H&P.  Will monitor.  [name] 12:36 PM

## 2011-07-18 NOTE — Progress Notes (Signed)
Rx Brief Lovenox note  Wt= 44.9 kg   CrCl~51 ml/min (N) Adjust Lovenox to 30mg  SQ daily for DVT prophylaxis in pt with wt <45 kg.  [name] 07/18/2011 4:31 AM

## 2011-07-18 NOTE — ED Notes (Signed)
Pt presents from home via EMS for respiratory distress - pt poor historian d/t respiratory status.

## 2011-07-18 NOTE — ED Notes (Signed)
Pt stated to this writer that she does not want to have a breathing tube placed and does not want to be placed on a "breathing machine"-ventilator-pt states she also does not want chest compressions if her heart stops-pt states "I just want to be comfortable"-states she has talked with her primary about getting a Living Will in place.

## 2011-07-18 NOTE — Care Management Note (Signed)
    Page 1 of 2   07/26/2011     12:42:47 PM   CARE MANAGEMENT NOTE 07/26/2011  Patient:  [name], [name]   Account Number:  [id number]  Date Initiated:  07/18/2011  Documentation initiated by:  Lanier Clam  Subjective/Objective Assessment:   ADMITTED W/SOB.CHF.READMIT-5/17-5/21/13     Action/Plan:   FROM HOME W/SPOUSE   Anticipated DC Date:  07/22/2011   Anticipated DC Plan:  HOME W HOME HEALTH SERVICES      DC Planning Services  CM consult      PAC Choice  DURABLE MEDICAL EQUIPMENT  HOME HEALTH   Choice offered to / List presented to:  C-1 Patient   DME arranged  3-N-1      DME agency  [facility]     [facility] arranged  HH-1 RN  HH-2 PT  HH-3 OT      [facility] agency  [facility]   Status of service:  Completed, signed off Medicare Important Message given?  NO (If response is "NO", the following Medicare IM given date fields will be blank) Date Medicare IM given:   Date Additional Medicare IM given:    Discharge Disposition:  HOME W HOME HEALTH SERVICES  Per UR Regulation:  Reviewed for med. necessity/level of care/duration of stay  If discussed at Long Length of Stay Meetings, dates discussed:    Comments:  07/26/2011 Raynelle Bring BSN CCM [phone] CM spoke with patient. Plans are for her to return to her home in [place] where her spouse and daughter will be caregivers. States she is able to ambulate with assistance. There are orders for HHrn, pt, ot and 3n1 and home O2. HH choice offered. Pt chose [facility]. ist of HH agencies placed on shadow chart and pt has a copy. [facility] has been notified will service patient with hh and dme.   [id number] Davis,RN, BSN, CCM patient transferred to SDU due to A. Fib and for treatment 07/18/11 [facility] MAHABIR RN,BSN NCM [phone]

## 2011-07-18 NOTE — ED Notes (Signed)
Respiratory paged to place patient back on BiPaP.
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
## 2011-07-18 NOTE — ED Notes (Signed)
Spoke with Dr. Conley Rolls. Dr. Conley Rolls states to change patient's bed assignment from [facility] to Telemetry.

## 2011-07-18 NOTE — ED Notes (Signed)
ZOX:WRUE<AV> Expected date:<BR> Expected time:12:58 AM<BR> Means of arrival:Ambulance<BR> Comments:<BR> M241 -- Pulmonary Edema/CPAP

## 2011-07-18 NOTE — ED Notes (Signed)
Le, MD at bedside.  

## 2011-07-19 ENCOUNTER — Inpatient Hospital Stay (HOSPITAL_COMMUNITY): Payer: Medicare Other

## 2011-07-19 DIAGNOSIS — R627 Adult failure to thrive: Secondary | ICD-10-CM

## 2011-07-19 DIAGNOSIS — I70209 Unspecified atherosclerosis of native arteries of extremities, unspecified extremity: Secondary | ICD-10-CM

## 2011-07-19 DIAGNOSIS — J438 Other emphysema: Secondary | ICD-10-CM

## 2011-07-19 DIAGNOSIS — I509 Heart failure, unspecified: Secondary | ICD-10-CM

## 2011-07-19 LAB — MRSA PCR SCREENING: MRSA by PCR: NEGATIVE

## 2011-07-19 LAB — BASIC METABOLIC PANEL
CO2: 24 mEq/L (ref 19–32)
Calcium: 8.7 mg/dL (ref 8.4–10.5)
Chloride: 101 mEq/L (ref 96–112)
Glucose, Bld: 117 mg/dL — ABNORMAL HIGH (ref 70–99)
Sodium: 134 mEq/L — ABNORMAL LOW (ref 135–145)

## 2011-07-19 LAB — CBC
Hemoglobin: 12 g/dL (ref 12.0–15.0)
MCH: 25.9 pg — ABNORMAL LOW (ref 26.0–34.0)
MCHC: 30.5 g/dL (ref 30.0–36.0)
Platelets: 319 10*3/uL (ref 150–400)
RDW: 15.9 % — ABNORMAL HIGH (ref 11.5–15.5)

## 2011-07-19 LAB — PROCALCITONIN: Procalcitonin: 2.68 ng/mL

## 2011-07-19 LAB — CARDIAC PANEL(CRET KIN+CKTOT+MB+TROPI): Total CK: 41 U/L (ref 7–177)

## 2011-07-19 LAB — PRO B NATRIURETIC PEPTIDE: Pro B Natriuretic peptide (BNP): 26629 pg/mL — ABNORMAL HIGH (ref 0–450)

## 2011-07-19 MED ORDER — LORAZEPAM 2 MG/ML IJ SOLN
0.5000 mg | Freq: Once | INTRAMUSCULAR | Status: DC
  Filled 2011-07-19: qty 1

## 2011-07-19 MED ORDER — FUROSEMIDE 10 MG/ML IJ SOLN
40.0000 mg | Freq: Every day | INTRAMUSCULAR | Status: DC
  Administered 2011-07-19 – 2011-07-21 (×3): 40 mg via INTRAVENOUS
  Filled 2011-07-19 (×4): qty 4

## 2011-07-19 MED ORDER — PROMETHAZINE HCL 25 MG/ML IJ SOLN
12.5000 mg | Freq: Once | INTRAMUSCULAR | Status: DC
  Filled 2011-07-19: qty 1

## 2011-07-19 MED ORDER — METOPROLOL SUCCINATE ER 50 MG PO TB24
50.0000 mg | ORAL_TABLET | Freq: Every day | ORAL | Status: DC
  Administered 2011-07-19 – 2011-07-20 (×2): 50 mg via ORAL
  Filled 2011-07-19 (×3): qty 1

## 2011-07-19 MED ORDER — METOPROLOL TARTRATE 1 MG/ML IV SOLN
5.0000 mg | Freq: Once | INTRAVENOUS | Status: DC
  Filled 2011-07-19: qty 5

## 2011-07-19 NOTE — Significant Event (Signed)
Rapid Response Event Note  Overview: Time Called: 0710 Arrival Time: 0712 Event Type: Cardiac  Initial Focused Assessment:   Interventions:   Event Summary: Name of Physician Notified: Dr. Cena Benton at 0730    at    Outcome: Transferred (Comment)  Event End Time: 0800  [name]

## 2011-07-19 NOTE — Significant Event (Signed)
Rapid Response Event Note  Overview: Time Called: 0710 Arrival Time: 0712 Event Type: Cardiac   Initial Focused Assessment: Patient SOB but rapidly improving with NRM unable to obtain SAT reading, B/P obtained with doppler.  B/P 70's, HR 120's,   A&O, warm and dry  Interventions: MD notified, patient transferred to SD for closer monitoring, appears stable at this time  Event Summary:   at      at          [place], Senaida Lange

## 2011-07-19 NOTE — Progress Notes (Signed)
I have notified K. Schorr, NP regarding patient being nauseated and having SOB, anxiety and elevated HR.  02 sats 80's to 92% on 100% NRB.  Patient continues to c/o nausea.  HR in 120's to 130's.  New orders received.

## 2011-07-19 NOTE — Progress Notes (Addendum)
Patient ID: [name], female   DOB: 1929/11/30, 78 y.o.   MRN: [id number]  Subjective: Pt reports more shortness of breath this AM.  Objective:  Vital signs in last 24 hours:  Filed Vitals:   07/18/11 1300 07/18/11 1740 07/18/11 2159 07/19/11 0522  BP: 90/68  105/69 135/76  Pulse: 114  111 112  Temp: 98.2 F (36.8 C)  98 F (36.7 C) 98 F (36.7 C)  TempSrc: Axillary  Oral Oral  Resp: 20  24 20   Height:      Weight:    44.4 kg (97 lb 14.2 oz)  SpO2: 96% 96% 96% 95%    Intake/Output from previous day:   Intake/Output Summary (Last 24 hours) at 07/19/11 0823 Last data filed at 07/19/11 0525  Gross per 24 hour  Intake    240 ml  Output    560 ml  Net   -320 ml    Physical Exam: General: Alert, awake, follows commands appropriately, in no acute distress. HEENT: No bruits, no goiter. Moist mucous membranes, no scleral icterus, no conjunctival pallor. Heart: Regular rhythm, tachycardic, S1/S2 +, no murmurs, rubs, gallops. Lungs: Decreased breath sounds bilaterally with crackles Abdomen: Soft, nontender, nondistended, positive bowel sounds. Extremities: No clubbing or cyanosis, no pitting edema,  positive pedal pulses. Neuro: Grossly nonfocal.  Lab Results:  Lab 07/18/11 0505 07/18/11 0135  WBC 18.9* 14.1*  HGB 13.6 13.3  HCT 43.6 42.7  PLT 342 389  MCV 83.4 83.9   Lab 07/19/11 0309 07/18/11 0235  NA 134* 137  K 4.5 3.8  CL 101 99  CO2 24 22  GLUCOSE 117* 273*  BUN 28* 26*  CREATININE 0.86 0.96  CALCIUM 8.7 9.3   Cardiac markers:  Lab 07/19/11 0309 07/18/11 1902 07/18/11 1207  CKMB 3.7 4.5* 5.5*  TROPONINI <0.30 <0.30 <0.30  MYOGLOBIN -- -- --   Studies/Results:  Dg Chest Port 1 View 07/19/2011   IMPRESSION:  Congestive heart failure with left lower lobe collapse/consolidation.    Dg Chest Port 1 View 07/18/2011    IMPRESSION:  Marked worsening aeration.  Probable interval development of CHF.    Medications: Scheduled Meds:   .  aspirin  325 mg Oral Daily  . atorvastatin  40 mg Oral q1800  . azithromycin  500 mg Intravenous Q24H  . docusate sodium  100 mg Oral BID  . enoxaparin  injection  30 mg Subcutaneous Q24H  . ferrous sulfate  325 mg Oral Q breakfast  . furosemide  20 mg Oral Daily  . loratadine  10 mg Oral Daily  . LORazepam  0.5 mg Intravenous Once  . pantoprazole  40 mg Oral Q1200  . potassium chloride SA  20 mEq Oral Daily  . promethazine  12.5 mg Intravenous Once  . vitamin C  1,000 mg Oral Daily   Continuous Infusions:  PRN Meds:.sodium chloride, albuterol, ondansetron (ZOFRAN) IV, ondansetron, sodium chloride  Assessment/Plan:  Principal Problem:  Cardio  Acute on chronic systolic CHF (congestive heart failure), NYHA class 3 - decompensating this AM with saturations in 60's but now clinically improving and current saturation in low 90's - will continue to monitor pt in SDU - I called cardiology for further evaluation of CHF and recommendation on Lasix and its dosing - will given one Lasix 40 mg IV and continue daily - I don't have recent 2 D ECHO in EPIC so will see with cardiology if one is needed at this time if no recent on is  available - strict I's and O's, daily weights   Hypotension - unclear etiology at this time - tachycardia may be due to nebulizer treatment - will check procalcitonin level and we may need to broaden antibiotic coverage accordingly - will check CBC this AM as the one yesterday showed increasing WBC and no new one available this AM   Atherosclerosis of native arteries of the extremities with intermittent claudication  Active Problems:  Pulmonary  Acute on chronic respiratory failure secondary to COPD (chronic obstructive pulmonary disease) - albuterol nebulizer ordered prn - continue to monitor in SDU - continue Zthromax - spoke with PCCM and if pt deteriorates clinically we will consult     Hyperglycemia - will check A1C   Severe protein-calorie  malnutrition - multifactorial in nature and secondary to progressive failure to thrive, underlying cardio and pulmonary conditions   EDUCATION - test results and diagnostic studies were discussed with patient  - patient verbalized the understanding - questions were answered at the bedside and contact information was provided for additional questions or concerns   LOS: 1 day   MAGICK-Dixie Jafri 07/19/2011, 8:23 AM  [facility] Pager: [phone]
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
## 2011-07-19 NOTE — Progress Notes (Signed)
Patient transferred to Step Down, report given.  Patient continues to be on 100% NRB, sats in low 90's.  HR in low 100's.  Patient voicing she is feeling some better.

## 2011-07-19 NOTE — Progress Notes (Signed)
Rapid Response called.  Patient has 02 sats in 50's and 60's, struggling to breathe.  02 on 100% NRB.  HR in 120's to 130's. Patient anxious.  Phenergan and Ativan held until Rapid Response nurse arrives.

## 2011-07-20 DIAGNOSIS — I509 Heart failure, unspecified: Secondary | ICD-10-CM

## 2011-07-20 DIAGNOSIS — J438 Other emphysema: Secondary | ICD-10-CM

## 2011-07-20 DIAGNOSIS — I70209 Unspecified atherosclerosis of native arteries of extremities, unspecified extremity: Secondary | ICD-10-CM

## 2011-07-20 DIAGNOSIS — R627 Adult failure to thrive: Secondary | ICD-10-CM

## 2011-07-20 LAB — CBC
Platelets: 295 10*3/uL (ref 150–400)
RBC: 4.43 MIL/uL (ref 3.87–5.11)
RDW: 15.9 % — ABNORMAL HIGH (ref 11.5–15.5)
WBC: 16 10*3/uL — ABNORMAL HIGH (ref 4.0–10.5)

## 2011-07-20 LAB — BASIC METABOLIC PANEL
CO2: 26 mEq/L (ref 19–32)
Chloride: 102 mEq/L (ref 96–112)
Creatinine, Ser: 0.85 mg/dL (ref 0.50–1.10)
GFR calc Af Amer: 74 mL/min — ABNORMAL LOW (ref >90)
Potassium: 4.2 mEq/L (ref 3.5–5.1)
Sodium: 137 mEq/L (ref 135–145)

## 2011-07-20 LAB — HEMOGLOBIN A1C
Hgb A1c MFr Bld: 5.8 % — ABNORMAL HIGH (ref <5.7)
Mean Plasma Glucose: 120 mg/dL — ABNORMAL HIGH (ref <117)

## 2011-07-20 MED ORDER — VANCOMYCIN HCL IN DEXTROSE 1-5 GM/200ML-% IV SOLN
1000.0000 mg | INTRAVENOUS | Status: DC
  Administered 2011-07-20 – 2011-07-21 (×2): 1000 mg via INTRAVENOUS
  Filled 2011-07-20 (×2): qty 200

## 2011-07-20 MED ORDER — MOXIFLOXACIN HCL IN NACL 400 MG/250ML IV SOLN: 400.0000 mg | INTRAVENOUS | Status: DC

## 2011-07-20 MED ORDER — PIPERACILLIN-TAZOBACTAM 3.375 G IVPB
3.3750 g | Freq: Three times a day (TID) | INTRAVENOUS | Status: DC
  Administered 2011-07-20 – 2011-07-22 (×5): 3.375 g via INTRAVENOUS
  Filled 2011-07-20 (×7): qty 50

## 2011-07-20 MED ORDER — BOOST / RESOURCE BREEZE PO LIQD
1.0000 | Freq: Two times a day (BID) | ORAL | Status: DC
  Administered 2011-07-20 – 2011-07-24 (×4): 1 via ORAL

## 2011-07-20 MED ORDER — GI COCKTAIL ~~LOC~~
30.0000 mL | Freq: Once | ORAL | Status: AC
  Administered 2011-07-20: 30 mL via ORAL
  Filled 2011-07-20: qty 30

## 2011-07-20 NOTE — Progress Notes (Addendum)
Patient ID: [name], female   DOB: 1929/06/23, 78 y.o.   MRN: [id number]  Subjective: No events overnight. Patient denies chest pain, shortness of breath, abdominal pain.   Objective:  Vital signs in last 24 hours:  Filed Vitals:   07/20/11 1200 07/20/11 1221 07/20/11 1335 07/20/11 1450  BP:  95/26 83/51 83/46   Pulse: 105 102 107 107  Temp: 98.9 F (37.2 C)     TempSrc: Oral     Resp: 32 28 26 30   Height:      Weight:      SpO2: 99% 97% 96% 98%    Intake/Output from previous day:   Intake/Output Summary (Last 24 hours) at 07/20/11 1601 Last data filed at 07/20/11 1500  Gross per 24 hour  Intake    572 ml  Output    865 ml  Net   -293 ml    Physical Exam: General: Alert, awake, oriented x3, in no acute distress. HEENT: No bruits, no goiter. Moist mucous membranes, no scleral icterus, no conjunctival pallor. Heart: Regular rhythm but tachycardic, S1/S2 +, no murmurs, rubs, gallops. Lungs: Clear to auscultation bilaterally with bibasilar crackles now improving but still present. No wheezing, no rhonchi, no rales.  Abdomen: Soft, nontender, nondistended, positive bowel sounds. Extremities: No clubbing or cyanosis, no pitting edema,  positive pedal pulses. Neuro: Grossly nonfocal.  Lab Results:  Lab 07/20/11 0327 07/19/11 0309 07/18/11 0505 07/18/11 0135  WBC 16.0* 17.6* 18.9* 14.1*  HGB 11.5* 12.0 13.6 13.3  HCT 37.1 39.4 43.6 42.7  PLT 295 319 342 389   Lab 07/20/11 0327 07/19/11 0309 07/18/11 0505 07/18/11 0235  NA 137 134* -- 137  K 4.2 4.5 -- 3.8  CL 102 101 -- 99  CO2 26 24 -- 22  GLUCOSE 123* 117* -- 273*  BUN 25* 28* -- 26*  CREATININE 0.85 0.86 0.98 0.96  CALCIUM 8.8 8.7 -- 9.3   Cardiac markers:  Lab 07/19/11 0309 07/18/11 1902 07/18/11 1207  CKMB 3.7 4.5* 5.5*  TROPONINI <0.30 <0.30 <0.30  MYOGLOBIN -- -- --    Recent Results (from the past 240 hour(s))  MRSA PCR SCREENING     Status: Normal   Collection Time   07/19/11  8:23 AM     Component Value Range Status Comment   MRSA by PCR NEGATIVE  NEGATIVE  Final     Studies/Results:  Dg Chest Port 1 View 07/19/2011     IMPRESSION:  Congestive heart failure with left lower lobe collapse/consolidation.    Medications: Scheduled Meds:   . aspirin  325 mg Oral Daily  . atorvastatin  40 mg Oral q1800  . azithromycin  500 mg Intravenous Q24H  . docusate sodium  100 mg Oral BID  . enoxaparin (LOVENOX) injection  30 mg Subcutaneous Q24H  . feeding supplement  1 Container Oral BID BM  . ferrous sulfate  325 mg Oral Q breakfast  . furosemide  40 mg Intravenous Daily  . gi cocktail  30 mL Oral Once  . loratadine  10 mg Oral Daily  . LORazepam  0.5 mg Intravenous Once  . metoprolol  5 mg Intravenous Once  . metoprolol succinate  50 mg Oral Daily  . pantoprazole  40 mg Oral Q1200  . potassium chloride SA  20 mEq Oral Daily  . promethazine  12.5 mg Intravenous Once  . sodium chloride  3 mL Intravenous Q12H  . sodium chloride  3 mL Intravenous Q12H  . vitamin C  1,000 mg Oral Daily   Continuous Infusions:  PRN Meds:.sodium chloride, ondansetron (ZOFRAN) IV, ondansetron, sodium chloride  Assessment/Plan:  Principal Problem:  Cardio  Acute on chronic systolic CHF (congestive heart failure), NYHA class 3  - slight clinical improvement this AM with saturations in low 90's  - will continue to monitor pt in SDU  - will continue Lasix but will transition to PO in AM - obtain CXR in AM - strict I's and O's, daily weights   Hypotension  - unclear etiology at this time but appears to be chronic medical problem for the patient - tachycardia may be due to nebulizer treatment  - will continue metoprolol for now as she seems to be responding well - WBC are slowly trending down  Atherosclerosis of native arteries of the extremities with intermittent claudication  Active Problems:  Pulmonary  Acute on chronic respiratory failure secondary to COPD (chronic obstructive  pulmonary disease)  - albuterol nebulizer ordered prn  - continue to monitor in SDU  - given pt's fever overnight will change abx to Vancomycin and Zosyn as PCT level indicative of SIRS - spoke with PCCM and if pt deteriorates clinically we will consult  - repeat CXR in AM  Fever - on Zithromax - with tachycardia and PCT level suggestive of systemic infection - will check blood culture - check UA and urine culture - broaden the spectrum to Vancomycin and Zosyn per pharmacy to dose please  Hyperglycemia  - A1C stable and at normal target  Severe protein-calorie malnutrition  - multifactorial in nature and secondary to progressive failure to thrive, underlying cardio and pulmonary conditions   EDUCATION  - test results and diagnostic studies were discussed with patient  - patient verbalized the understanding  - questions were answered at the bedside and contact information was provided for additional questions or concerns    LOS: 2 days   MAGICK-Nakema Fake 07/20/2011, 4:01 PM  [facility] Pager: [phone]

## 2011-07-20 NOTE — Progress Notes (Signed)
ANTIBIOTIC CONSULT NOTE - INITIAL  Pharmacy Consult for Vancomycin/Zosyn Indication: rule out pneumonia  Allergies  Allergen Reactions  . Ciprofloxacin     SOB  . Codeine Nausea Only  . Penicillins Other (See Comments)    unknown  . Sodium Pentobarbital (Pentobarbital Sodium)     Patient Measurements: Height: 5\' 5"  (165.1 cm) Weight: 99 lb 10.4 oz (45.2 kg) IBW/kg (Calculated) : 57   Vital Signs: Temp: 98.9 F (37.2 C) (05/30 1200) Temp src: Oral (05/30 1200) BP: 83/46 mmHg (05/30 1450) Pulse Rate: 107  (05/30 1450) Intake/Output from previous day: 05/29 0701 - 05/30 0700 In: 484 [I.V.:230; IV Piggyback:254] Out: 1800 [Urine:1800] Intake/Output from this shift: Total I/O In: 98 [I.V.:90; IV Piggyback:8] Out: 725 [Urine:725]  Labs:  [facility] 07/20/11 0327 07/19/11 0309 07/18/11 0505  WBC 16.0* 17.6* 18.9*  HGB 11.5* 12.0 13.6  PLT 295 319 342  LABCREA -- -- --  CREATININE 0.85 0.86 0.98   Estimated Creatinine Clearance: 38.3 ml/min (by C-G formula based on Cr of 0.85). No results found for this basename: VANCOTROUGH:2,VANCOPEAK:2,VANCORANDOM:2,GENTTROUGH:2,GENTPEAK:2,GENTRANDOM:2,TOBRATROUGH:2,TOBRAPEAK:2,TOBRARND:2,AMIKACINPEAK:2,AMIKACINTROU:2,AMIKACIN:2, in the last 72 hours   Microbiology: Recent Results (from the past 720 hour(s))  URINE CULTURE     Status: Normal   Collection Time   07/09/11  6:30 AM      Component Value Range Status Comment   Specimen Description URINE, RANDOM   Final    Special Requests ZO:XWRUE ON [id number] @1425    Final    Culture  Setup Time [id number]   Final    Colony Count >=100,000 COLONIES/ML   Final    Culture ESCHERICHIA COLI   Final    Report Status 07/12/2011 FINAL   Final    Organism ID, Bacteria ESCHERICHIA COLI   Final   MRSA PCR SCREENING     Status: Normal   Collection Time   07/09/11 10:43 AM      Component Value Range Status Comment   MRSA by PCR NEGATIVE  NEGATIVE  Final   MRSA PCR SCREENING     Status: Normal     Collection Time   07/19/11  8:23 AM      Component Value Range Status Comment   MRSA by PCR NEGATIVE  NEGATIVE  Final     Assessment: 81 YOF to begin vancomycin and zosyn for suspected pneumonia in setting of COPD.  Patient has already received 3 days of azithromycin.  Antibiotics broadened d/t fever spike last night.  PCT 2.68 yesterday.  CrCl(N)~61 ml/min, CrCl(CG)~38 ml/min.  Patient describes an intolerance to penicillin.  She had experienced sweats and got a cold after taking penicillin 60 years ago.  Considering that this does not sound like a true allergic reaction to penicillin, will continue with Zosyn as ordered.  Goal of Therapy:  Vancomycin trough level 15-20 mcg/ml  Plan:  Zosyn 3.375g IV q8h (4 hour infusion time). Vancomycin 1g IV q24h. F/u SCr and trough levels as needed.  Clance Boll 07/20/2011,4:51 PM
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
## 2011-07-20 NOTE — Progress Notes (Signed)
Patient Name: [name] Date of Encounter: 07/20/2011    SUBJECTIVE: Breathing improving  TELEMETRY:  ST with rate 110: Filed Vitals:   07/20/11 1450 07/20/11 1600 07/20/11 1949 07/20/11 2000  BP: 83/46   97/51  Pulse: 107 92  110  Temp:  98.3 F (36.8 C) 98.9 F (37.2 C)   TempSrc:  Oral Oral   Resp: 30 32  34  Height:      Weight:      SpO2: 98% 94%  96%    Intake/Output Summary (Last 24 hours) at 07/20/11 2124 Last data filed at 07/20/11 2000  Gross per 24 hour  Intake    742 ml  Output   1050 ml  Net   -308 ml    LABS: Basic Metabolic Panel:  Basename 07/20/11 0327 07/19/11 0309  NA 137 134*  K 4.2 4.5  CL 102 101  CO2 26 24  GLUCOSE 123* 117*  BUN 25* 28*  CREATININE 0.85 0.86  CALCIUM 8.8 8.7  MG -- --  PHOS -- --   CBC:  Basename 07/20/11 0327 07/19/11 0309 07/18/11 0135  WBC 16.0* 17.6* --  NEUTROABS -- -- 7.4  HGB 11.5* 12.0 --  HCT 37.1 39.4 --  MCV 83.7 84.9 --  PLT 295 319 --   Cardiac Enzymes:  Basename 07/19/11 0309 07/18/11 1902 07/18/11 1207  CKTOTAL 41 49 77  CKMB 3.7 4.5* 5.5*  CKMBINDEX -- -- --  TROPONINI <0.30 <0.30 <0.30   Hemoglobin A1C:  Basename 07/20/11 0327  HGBA1C 5.8*   I/O: - 2223 cc since admission. Radiology/Studies:  No new  Physical Exam: Blood pressure 97/51, pulse 110, temperature 98.9 F (37.2 C), temperature source Oral, resp. rate 34, height 5\' 5"  (1.651 m), weight 45.2 kg (99 lb 10.4 oz), SpO2 96.00%. Weight change: 0.8 kg (1 lb 12.2 oz)   Decreased breath sounds. Faints basilar rales  No obvious gallop but difficult to hear.  ASSESSMENT:  1. A/C systolic heart failure  2. Respiratory failure, multifactorial   Plan:  1. Continue IV diuresis as tolerated by BP and renal function  Selinda Eon 07/20/2011, 9:24 PM

## 2011-07-20 NOTE — Progress Notes (Signed)
chart reviewed

## 2011-07-21 ENCOUNTER — Inpatient Hospital Stay (HOSPITAL_COMMUNITY): Payer: Medicare Other

## 2011-07-21 ENCOUNTER — Encounter (HOSPITAL_COMMUNITY): Payer: Self-pay | Admitting: Internal Medicine

## 2011-07-21 DIAGNOSIS — I4891 Unspecified atrial fibrillation: Secondary | ICD-10-CM | POA: Insufficient documentation

## 2011-07-21 DIAGNOSIS — I70209 Unspecified atherosclerosis of native arteries of extremities, unspecified extremity: Secondary | ICD-10-CM

## 2011-07-21 DIAGNOSIS — I509 Heart failure, unspecified: Secondary | ICD-10-CM

## 2011-07-21 DIAGNOSIS — R627 Adult failure to thrive: Secondary | ICD-10-CM

## 2011-07-21 DIAGNOSIS — J438 Other emphysema: Secondary | ICD-10-CM

## 2011-07-21 LAB — BASIC METABOLIC PANEL
BUN: 27 mg/dL — ABNORMAL HIGH (ref 6–23)
Chloride: 98 mEq/L (ref 96–112)
GFR calc Af Amer: 69 mL/min — ABNORMAL LOW (ref >90)
GFR calc non Af Amer: 59 mL/min — ABNORMAL LOW (ref >90)
Potassium: 3.7 mEq/L (ref 3.5–5.1)
Sodium: 133 mEq/L — ABNORMAL LOW (ref 135–145)

## 2011-07-21 LAB — CBC
HCT: 37 % (ref 36.0–46.0)
Hemoglobin: 11.5 g/dL — ABNORMAL LOW (ref 12.0–15.0)
MCH: 25.8 pg — ABNORMAL LOW (ref 26.0–34.0)
MCHC: 31.1 g/dL (ref 30.0–36.0)
MCV: 83.1 fL (ref 78.0–100.0)
Platelets: 242 10*3/uL (ref 150–400)
RBC: 4.45 MIL/uL (ref 3.87–5.11)
RDW: 15.7 % — ABNORMAL HIGH (ref 11.5–15.5)
WBC: 13.6 10*3/uL — ABNORMAL HIGH (ref 4.0–10.5)

## 2011-07-21 MED ORDER — DILTIAZEM HCL 100 MG IV SOLR: 5.0000 mg/h | INTRAVENOUS | Status: DC

## 2011-07-21 MED ORDER — AMIODARONE LOAD VIA INFUSION
150.0000 mg | Freq: Once | INTRAVENOUS | Status: AC
  Administered 2011-07-21: 150 mg via INTRAVENOUS
  Filled 2011-07-21: qty 83.34

## 2011-07-21 MED ORDER — DILTIAZEM LOAD VIA INFUSION
10.0000 mg | Freq: Once | INTRAVENOUS | Status: AC
  Administered 2011-07-21: 10 mg via INTRAVENOUS
  Filled 2011-07-21: qty 10

## 2011-07-21 MED ORDER — DIGOXIN 0.05 MG/ML PO SOLN: 0.2500 mg | Freq: Every day | ORAL | Status: DC

## 2011-07-21 MED ORDER — DIGOXIN 0.25 MG/ML IJ SOLN
0.5000 mg | INTRAMUSCULAR | Status: AC
  Administered 2011-07-21: 0.5 mg via INTRAVENOUS
  Filled 2011-07-21: qty 2

## 2011-07-21 MED ORDER — AMIODARONE HCL IN DEXTROSE 360-4.14 MG/200ML-% IV SOLN
60.0000 mg/h | INTRAVENOUS | Status: AC
  Administered 2011-07-21 (×2): 60 mg/h via INTRAVENOUS
  Filled 2011-07-21: qty 200

## 2011-07-21 MED ORDER — ENOXAPARIN SODIUM 40 MG/0.4ML ~~LOC~~ SOLN
40.0000 mg | Freq: Two times a day (BID) | SUBCUTANEOUS | Status: DC
  Administered 2011-07-21 – 2011-07-26 (×11): 40 mg via SUBCUTANEOUS
  Filled 2011-07-21 (×12): qty 0.4

## 2011-07-21 MED ORDER — DIGOXIN 0.25 MG/ML IJ SOLN
0.2500 mg | Freq: Once | INTRAMUSCULAR | Status: AC
  Administered 2011-07-21: 0.25 mg via INTRAVENOUS
  Filled 2011-07-21: qty 1

## 2011-07-21 MED ORDER — AMIODARONE HCL IN DEXTROSE 360-4.14 MG/200ML-% IV SOLN
30.0000 mg/h | INTRAVENOUS | Status: DC
  Administered 2011-07-21 – 2011-07-22 (×2): 30 mg/h via INTRAVENOUS
  Filled 2011-07-21 (×3): qty 200

## 2011-07-21 MED ORDER — SODIUM CHLORIDE 0.9 % IV BOLUS (SEPSIS)
250.0000 mL | Freq: Once | INTRAVENOUS | Status: AC
  Administered 2011-07-21: 250 mL via INTRAVENOUS

## 2011-07-21 MED ORDER — METOPROLOL SUCCINATE ER 50 MG PO TB24
50.0000 mg | ORAL_TABLET | Freq: Every day | ORAL | Status: DC
  Filled 2011-07-21: qty 1

## 2011-07-21 MED ORDER — DILTIAZEM HCL 100 MG IV SOLR
5.0000 mg/h | INTRAVENOUS | Status: DC
  Administered 2011-07-21: 5 mg/h via INTRAVENOUS
  Administered 2011-07-22: 10 mg/h via INTRAVENOUS
  Filled 2011-07-21 (×3): qty 100

## 2011-07-21 NOTE — Progress Notes (Signed)
Pt scheduled to receive metoprolol succinate 50mg  this am.  Due to decreased BP consulted with pharmacist & NP and order received to hold metoprolol this am. [name]

## 2011-07-21 NOTE — Progress Notes (Signed)
Pt is presently in atrial flutter HR 90-100. Pt states she is feeling much better and nausea has now subsided. Will continue to monitor pt. Maeola Harman

## 2011-07-21 NOTE — Progress Notes (Signed)
Report called to [facility] and pt transported to ICU room 1223. [name]

## 2011-07-21 NOTE — Progress Notes (Signed)
Subjective:  Heart racing this am. Mild nausea (?HF). Added amio.   Objective:  Vital Signs in the last 24 hours: Temp:  [96.5 F (35.8 C)-98.9 F (37.2 C)] 96.5 F (35.8 C) (05/31 0404) Pulse Rate:  [82-180] 180  (05/31 0557) Resp:  [21-34] 21  (05/31 0404) BP: (40-111)/(0-75) 62/0 mmHg (05/31 0751) SpO2:  [88 %-99 %] 91 % (05/31 0404) Weight:  [43.772 kg (96 lb 8 oz)-44.1 kg (97 lb 3.6 oz)] 43.772 kg (96 lb 8 oz) (05/31 0404)  Intake/Output from previous day: 05/30 0701 - 05/31 0700 In: 508 [I.V.:200; IV Piggyback:308] Out: 925 [Urine:925]   Physical Exam: General: Well developed, well nourished, in no acute distress. Head:  Normocephalic and atraumatic. Lungs: Clear to auscultation and percussion. Heart: Tachy irreg No murmur, rubs or gallops.  Pulses: Pulses normal in all 4 extremities. Abdomen: soft, non-tender, positive bowel sounds. Extremities: No clubbing or cyanosis. No edema. Neurologic: Alert and oriented x 3.    Lab Results:  Basename 07/21/11 0325 07/20/11 0327  WBC 13.6* 16.0*  HGB 11.5* 11.5*  PLT 242 295    Basename 07/21/11 0325 07/20/11 0327  NA 133* 137  K 3.7 4.2  CL 98 102  CO2 25 26  GLUCOSE 115* 123*  BUN 27* 25*  CREATININE 0.90 0.85    Basename 07/19/11 0309 07/18/11 1902  TROPONINI <0.30 <0.30   Hepatic Function Panel No results found for this basename: PROT,ALBUMIN,AST,ALT,ALKPHOS,BILITOT,BILIDIR,IBILI in the last 72 hours No results found for this basename: CHOL in the last 72 hours No results found for this basename: PROTIME in the last 72 hours  Imaging: Dg Chest Port 1 View  07/21/2011  *RADIOLOGY REPORT*  Clinical Data: Congestive heart failure.  PORTABLE CHEST - 1 VIEW  Comparison: 07/19/2011.  Findings: Lung volumes are normal.  There are bibasilar opacities which may represent areas of atelectasis and/or consolidation. Small - moderate left and small right-sided pleural effusions are similar to the prior examination.   There is cephalization of the pulmonary vasculature and indistinctness of the interstitial markings with some patchy airspace opacities throughout the lungs bilaterally, likely to represent moderate pulmonary edema (superimposed infection is difficult to exclude).  Heart size is mildly enlarged. The patient is rotated to the right on today's exam, resulting in distortion of the mediastinal contours and reduced diagnostic sensitivity and specificity for mediastinal pathology.  Atherosclerotic calcifications within the arch of the aorta.  IMPRESSION: 1.  Findings again suggestive of moderate pulmonary edema from congestive heart failure with small - moderate left and small right- sided pleural effusions. 2.  Additional bibasilar opacities may represent areas of extensive atelectasis or superimposed air space consolidation from aspiration or infection. 3.  Atherosclerosis.  Original Report Authenticated By: Florencia Reasons, M.D.   Personally viewed.   Telemetry: HR 170-180's afib/flutter Personally viewed.   EKG:  As above  Cardiac Studies:  EF 35%  Assessment/Plan:  Principal Problem:  *Acute on chronic systolic CHF (congestive heart failure), NYHA class 3 Active Problems:  Atherosclerosis of native arteries of the extremities with intermittent claudication  COPD (chronic obstructive pulmonary disease)  Hypertension  Elevated troponin  Severe protein-calorie malnutrition  Failure to thrive in adult  AFIB/FLUTTER  - difficult to rate control  - Will start IV amiodarone  - Likely convert to PO amio tomorrow. (note mild nausea currently)  HF  - Continue with Lasix IV today.  - Creat stable (at DC last hospital stay 1.98)  WBC   - decreased. ?  Infection/COPD component?  - ABX  If continues to be in AFIB, need to have discussion with her about anticoagulation.  - for now would increase Lovenox to full dose.   [name] 07/21/2011, 8:13 AM

## 2011-07-21 NOTE — Progress Notes (Signed)
Patient has gone into rapid atrial fib with heart rate in the 160's to 200 range. BP by doppler only and is low. Patient complains of feeling nauseated and clammy. Rapid response called however they are on a code stroke so A/C Georgette Shell to room and stayed until after 0700 this am. Dr Dayna Ramus and NP Vernona Rieger to room shortly after rapid response called and IV digoxin given x2 doses. Cardizem drip started (5 mg/hr) at approx 0700 with bolus given (10 mg) and at that same time this RN can barely get a doppler bp at ? 42/0 and Dr Conley Rolls is aware. Ginny Forth

## 2011-07-21 NOTE — Progress Notes (Signed)
Notified Dr. Izola Price of pt's continued HR in 150s. Dr. Izola Price instructed to modify cardizem order to allow titration from 5mg /hr up to 10mg /hr.  Langley Gauss, RN

## 2011-07-21 NOTE — Progress Notes (Signed)
Subjective: Awake, somewhat lethargic, reports "feeling better than i was earlier this am". Denies chest pain and nausea improved. Events of early am noted.   Objective:  Vital signs in last 24 hours:  Filed Vitals:   07/21/11 1700 07/21/11 1730 07/21/11 1800 07/21/11 1900  BP: 85/58  96/46   Pulse: 153 138 36 31  Resp: 26 26 25 26   SpO2: 95% 95% 95% 80%   Intake/Output from previous day:  Intake/Output Summary (Last 24 hours) at 07/21/11 2014 Last data filed at 07/21/11 2000  Gross per 24 hour  Intake 921.17 ml  Output   1450 ml  Net -528.83 ml   Physical Exam: General: Alert, awake, oriented x3, in no acute distress. HEENT: No bruits, no goiter. Moist mucous membranes, no scleral icterus, no conjunctival pallor. Heart: Irregular rate and rhythm, S1/S2 +, no murmurs, rubs, gallops. Lungs: Decreased breath sounds at bases with crackles. No wheezing, no rhonchi, no rales.  Abdomen: Soft, nontender, nondistended, positive bowel sounds. Extremities: No clubbing or cyanosis, no pitting edema,  positive pedal pulses. Neuro: Grossly nonfocal.  Lab Results:  Lab 07/21/11 0325 07/20/11 0327 07/19/11 0309 07/18/11 0505 07/18/11 0135  WBC 13.6* 16.0* 17.6* 18.9* 14.1*  HGB 11.5* 11.5* 12.0 13.6 13.3  HCT 37.0 37.1 39.4 43.6 42.7  PLT 242 295 319 342 389   Lab 07/21/11 0325 07/20/11 0327 07/19/11 0309 07/18/11 0235  NA 133* 137 134* 137  K 3.7 4.2 4.5 3.8  CL 98 102 101 99  CO2 25 26 24 22   GLUCOSE 115* 123* 117* 273*  BUN 27* 25* 28* 26*  CREATININE 0.90 0.85 0.86 0.96  CALCIUM 8.7 8.8 8.7 9.3   Cardiac markers:  Lab 07/19/11 0309 07/18/11 1902 07/18/11 1207  CKMB 3.7 4.5* 5.5*  TROPONINI <0.30 <0.30 <0.30  MYOGLOBIN -- -- --   Studies/Results:  Dg Chest Port 1 View 07/21/2011    IMPRESSION:  1.  Findings again suggestive of moderate pulmonary edema from congestive heart failure with small - moderate left and small right- sided pleural effusions.  2.  Additional  bibasilar opacities may represent areas of extensive atelectasis or superimposed air space consolidation from aspiration or infection.  3.  Atherosclerosis.    Medications: Scheduled Meds:   . amiodarone  150 mg Intravenous Once  . aspirin  325 mg Oral Daily  . atorvastatin  40 mg Oral q1800  . digoxin  0.25 mg Intravenous Once  . digoxin  0.5 mg Intravenous STAT  . diltiazem  10 mg Intravenous Once  . docusate sodium  100 mg Oral BID  . enoxaparin (LOVENOX) injection  40 mg Subcutaneous Q12H  . feeding supplement  1 Container Oral BID BM  . ferrous sulfate  325 mg Oral Q breakfast  . furosemide  40 mg Intravenous Daily  . loratadine  10 mg Oral Daily  . metoprolol succinate  50 mg Oral Daily  . pantoprazole  40 mg Oral Q1200  . piperacillin-tazobactam (ZOSYN)  IV  3.375 g Intravenous Q8H  . potassium chloride SA  20 mEq Oral Daily  . sodium chloride  250 mL Intravenous Once  . sodium chloride  3 mL Intravenous Q12H  . sodium chloride  3 mL Intravenous Q12H  . vancomycin  1,000 mg Intravenous Q24H  . vitamin C  1,000 mg Oral Daily  . DISCONTD: digoxin  0.25 mg Oral Daily  . DISCONTD: enoxaparin (LOVENOX) injection  30 mg Subcutaneous Q24H  . DISCONTD: metoprolol succinate  50 mg  Oral Daily   Continuous Infusions:   . amiodarone (NEXTERONE PREMIX) 360 mg/200 mL dextrose 60 mg/hr (07/21/11 1114)   And  . amiodarone (NEXTERONE PREMIX) 360 mg/200 mL dextrose 30 mg/hr (07/21/11 1713)  . diltiazem (CARDIZEM) infusion 10 mg/hr (07/21/11 1642)  . DISCONTD: diltiazem (CARDIZEM) infusion     PRN Meds:.sodium chloride, ondansetron (ZOFRAN) IV, ondansetron, sodium chloride  Assessment/Plan:  Acute on chronic systolic CHF (congestive heart failure), NYHA class 3  - slight clinical improvement this AM with saturations in low 90's  - will continue to monitor pt in SDU  - will continue Lasix but will transition to PO in AM  - strict I's and O's, daily weights   Atrial Fibrillation  with RVR - started on amiodarone - continue to monitor vitals in SDU - will increase the Lovenox to full dose  Hypotension  - unclear etiology at this time but appears to be chronic medical problem for the patient  - tachycardia may be due to nebulizer treatment  - will continue metoprolol for now as she seems to be responding well  - WBC are slowly trending down   Atherosclerosis of native arteries of the extremities with intermittent claudication   Acute on chronic respiratory failure secondary to COPD (chronic obstructive pulmonary disease)  - albuterol nebulizer ordered prn  - continue to monitor in SDU  - given pt's fever overnight will change abx to Vancomycin and Zosyn as PCT level indicative of SIRS  - spoke with PCCM and if pt deteriorates clinically we will consult   Fever  - on Zithromax  - with tachycardia and PCT level suggestive of systemic infection  - will check blood culture  - check UA and urine culture  - broaden the spectrum to Vancomycin and Zosyn per pharmacy to dose please   Hyperglycemia  - A1C stable and at normal target   Severe protein-calorie malnutrition  - multifactorial in nature and secondary to progressive failure to thrive, underlying cardio and pulmonary conditions   EDUCATION  - test results and diagnostic studies were discussed with patient  - patient verbalized the understanding  - questions were answered at the bedside and contact information was provided for additional questions or concerns    LOS: 3 days   [facility] M 07/21/2011, 9:03 AM  MAGICK-Xochilt Conant  [facility], pager #: [phone] Main office number: [phone]
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
## 2011-07-22 DIAGNOSIS — I70209 Unspecified atherosclerosis of native arteries of extremities, unspecified extremity: Secondary | ICD-10-CM

## 2011-07-22 DIAGNOSIS — I509 Heart failure, unspecified: Secondary | ICD-10-CM

## 2011-07-22 DIAGNOSIS — R627 Adult failure to thrive: Secondary | ICD-10-CM

## 2011-07-22 DIAGNOSIS — J438 Other emphysema: Secondary | ICD-10-CM

## 2011-07-22 LAB — URINALYSIS, ROUTINE W REFLEX MICROSCOPIC
Bilirubin Urine: NEGATIVE
Ketones, ur: NEGATIVE mg/dL
Nitrite: NEGATIVE
Specific Gravity, Urine: 1.027 (ref 1.005–1.030)
Urobilinogen, UA: 0.2 mg/dL (ref 0.0–1.0)
pH: 5.5 (ref 5.0–8.0)

## 2011-07-22 LAB — BASIC METABOLIC PANEL
BUN: 18 mg/dL (ref 6–23)
CO2: 26 mEq/L (ref 19–32)
Chloride: 99 mEq/L (ref 96–112)
Glucose, Bld: 112 mg/dL — ABNORMAL HIGH (ref 70–99)
Potassium: 3 mEq/L — ABNORMAL LOW (ref 3.5–5.1)
Sodium: 136 mEq/L (ref 135–145)

## 2011-07-22 LAB — CBC
HCT: 37.3 % (ref 36.0–46.0)
Hemoglobin: 11.6 g/dL — ABNORMAL LOW (ref 12.0–15.0)
RBC: 4.54 MIL/uL (ref 3.87–5.11)
WBC: 12.4 10*3/uL — ABNORMAL HIGH (ref 4.0–10.5)

## 2011-07-22 LAB — URINE MICROSCOPIC-ADD ON

## 2011-07-22 MED ORDER — FUROSEMIDE 40 MG PO TABS
40.0000 mg | ORAL_TABLET | Freq: Every day | ORAL | Status: DC
  Filled 2011-07-22: qty 1

## 2011-07-22 MED ORDER — FUROSEMIDE 40 MG PO TABS
40.0000 mg | ORAL_TABLET | Freq: Two times a day (BID) | ORAL | Status: DC
  Administered 2011-07-22 – 2011-07-26 (×8): 40 mg via ORAL
  Filled 2011-07-22 (×11): qty 1

## 2011-07-22 MED ORDER — MOXIFLOXACIN HCL 400 MG PO TABS: 400.0000 mg | ORAL_TABLET | Freq: Every day | ORAL | Status: DC

## 2011-07-22 MED ORDER — AZITHROMYCIN 250 MG PO TABS
250.0000 mg | ORAL_TABLET | Freq: Every day | ORAL | Status: DC
  Administered 2011-07-22 – 2011-07-25 (×4): 250 mg via ORAL
  Filled 2011-07-22 (×4): qty 1

## 2011-07-22 MED ORDER — DILTIAZEM HCL 30 MG PO TABS
30.0000 mg | ORAL_TABLET | Freq: Four times a day (QID) | ORAL | Status: DC
  Administered 2011-07-22 – 2011-07-26 (×17): 30 mg via ORAL
  Filled 2011-07-22 (×20): qty 1

## 2011-07-22 MED ORDER — AMIODARONE HCL 200 MG PO TABS
200.0000 mg | ORAL_TABLET | Freq: Two times a day (BID) | ORAL | Status: DC
  Administered 2011-07-22 – 2011-07-26 (×8): 200 mg via ORAL
  Filled 2011-07-22 (×9): qty 1

## 2011-07-22 MED ORDER — BIOTENE DRY MOUTH MT LIQD
15.0000 mL | Freq: Two times a day (BID) | OROMUCOSAL | Status: DC
  Administered 2011-07-22 – 2011-07-26 (×5): 15 mL via OROMUCOSAL

## 2011-07-22 MED ORDER — AMIODARONE HCL 100 MG PO TABS
100.0000 mg | ORAL_TABLET | Freq: Two times a day (BID) | ORAL | Status: DC
  Administered 2011-07-22: 100 mg via ORAL
  Filled 2011-07-22 (×2): qty 1

## 2011-07-22 NOTE — Progress Notes (Signed)
Subjective:  She is so nice. Very pleasant. Feeling better. Breathing improved. Mild nausea after eating. Transferred to MICU yesterday because of worsening RVR.   Objective:  Vital Signs in the last 24 hours: Temp:  [97.9 F (36.6 C)-98.2 F (36.8 C)] 97.9 F (36.6 C) (06/01 0800) Pulse Rate:  [31-159] 73  (06/01 1000) Resp:  [17-29] 23  (06/01 1000) BP: (40-110)/(0-74) 94/42 mmHg (06/01 1000) SpO2:  [80 %-100 %] 94 % (06/01 1000)  Intake/Output from previous day: 05/31 0701 - 06/01 0700 In: 1111.6 [P.O.:120; I.V.:891.6; IV Piggyback:100] Out: 1790 [Urine:1790]   Physical Exam: General: Thin, elderly, in no acute distress. Head:  Normocephalic and atraumatic. Lungs: Mild crackles at bases. Heart: Irreg, normal rate.  No murmur, rubs or gallops.  Abdomen: soft, non-tender, positive bowel sounds. Extremities: No clubbing or cyanosis. No edema. Neurologic: Alert and oriented x 3.    Lab Results:  Basename 07/22/11 0843 07/21/11 0325  WBC 12.4* 13.6*  HGB 11.6* 11.5*  PLT 298 242    Basename 07/22/11 0843 07/21/11 0325  NA 136 133*  K 3.0* 3.7  CL 99 98  CO2 26 25  GLUCOSE 112* 115*  BUN 18 27*  CREATININE 0.75 0.90   Telemetry: Aflutter Personally viewed.   Cardiac Studies:  EF 35%  Assessment/Plan:  Principal Problem:  *Acute on chronic systolic CHF (congestive heart failure), NYHA class 3 Active Problems:  Atherosclerosis of native arteries of the extremities with intermittent claudication  COPD (chronic obstructive pulmonary disease)  Hypertension  Elevated troponin  Severe protein-calorie malnutrition  Failure to thrive in adult   AFLUTTER  - amio changed to PO 200 BID  - diltiazem changed to PO as well. Agree  - I wonder if at home she was having these episodes of RVR that lead to flash edema?  - I discussed anticoagulation with her and she clearly states that she does not wish to take coumadin. Understands risk of stroke/bleeding. Her sister died  with coumadin overdose she states. Continue with ASA.  WBC improving  ACUTE SYSTOLIC HF  - lasix now 40mg  PO QD. I will change to 40mg  BID. She was on QD at home when this recent exacerbation occurred.   - No ace-hypotension  - Continue Bb.   - EF 35%  PVD  - ASA, no claudication  Severe malnutrition  - encouraged diet.   DNR  Extensive review of labs, tele, discussion with Dr. Izola Price. High level of medical complexity and decision making.    [name] 07/22/2011, 10:37 AM

## 2011-07-22 NOTE — Progress Notes (Signed)
Pharmacist Heart Failure Core Measure Documentation  Assessment: [name] has an EF documented as 35% by outpatient ECHO  Rationale: Heart failure patients with left ventricular systolic dysfunction (LVSD) and an EF < 40% should be prescribed an angiotensin converting enzyme inhibitor (ACEI) or angiotensin receptor blocker (ARB) at discharge unless a contraindication is documented in the medical record.  This patient is not currently on an ACEI or ARB for HF.  This note is being placed in the record in order to provide documentation that a contraindication to the use of these agents is present for this encounter.  ACE Inhibitor or Angiotensin Receptor Blocker is contraindicated (specify all that apply)  []   ACEI allergy AND ARB allergy []   Angioedema []   Moderate or severe aortic stenosis []   Hyperkalemia [x]   Hypotension []   Renal artery stenosis []   Worsening renal function, preexisting renal disease or dysfunction  Thank You Geoffry Paradise Thi 07/22/2011 1:21 PM

## 2011-07-22 NOTE — Progress Notes (Signed)
Patient ID: [name], female   DOB: 12/15/1929, 78 y.o.   MRN: [id number]  Subjective: No events overnight. Patient denies chest pain, shortness of breath, abdominal pain.  Objective:  Vital signs in last 24 hours:  Filed Vitals:   07/22/11 0000 07/22/11 0200 07/22/11 0400 07/22/11 0600  BP: 84/45 104/61 110/66 107/51  Pulse: 115 118 116 94  Temp: 98.1 F (36.7 C)     SpO2: 95% 95% 99% 95%   Intake/Output from previous day:  Intake/Output Summary (Last 24 hours) at 07/22/11 0837 Last data filed at 07/22/11 0600  Gross per 24 hour  Intake 1074.87 ml  Output   1790 ml  Net -715.13 ml   Physical Exam: General: Alert, awake, follows commands, in no acute distress. HEENT: No bruits, no goiter. Moist mucous membranes, no scleral icterus, no conjunctival pallor. Heart: Irregular rate and rhythm, HR at bedside 80 -90 bpm S1/S2 +, no murmurs, rubs, gallops. Lungs: Decreased breath sounds at bases with crackles. No wheezing, no rhonchi, no rales.  Abdomen: Soft, nontender, nondistended, positive bowel sounds. Extremities: No clubbing or cyanosis, no pitting edema,  positive pedal pulses. Neuro: Grossly nonfocal.  Lab Results:  Lab 07/21/11 0325 07/20/11 0327 07/19/11 0309 07/18/11 0505 07/18/11 0135  WBC 13.6* 16.0* 17.6* 18.9* 14.1*  HGB 11.5* 11.5* 12.0 13.6 13.3  HCT 37.0 37.1 39.4 43.6 42.7  PLT 242 295 319 342 389   Lab 07/21/11 0325 07/20/11 0327 07/19/11 0309 07/18/11 0235  NA 133* 137 134* 137  K 3.7 4.2 4.5 3.8  CL 98 102 101 99  CO2 25 26 24 22   GLUCOSE 115* 123* 117* 273*  BUN 27* 25* 28* 26*  CREATININE 0.90 0.85 0.86 0.96  CALCIUM 8.7 8.8 8.7 9.3   Cardiac markers:  Lab 07/19/11 0309 07/18/11 1902 07/18/11 1207  CKMB 3.7 4.5* 5.5*  TROPONINI <0.30 <0.30 <0.30  MYOGLOBIN -- -- --    Studies/Results:  Dg Chest Port 1 View 07/21/2011   IMPRESSION:  1. Findings again suggestive of moderate pulmonary edema from congestive heart failure with small  - moderate left and small right- sided pleural effusions.  2. Additional bibasilar opacities may represent areas of extensive atelectasis or superimposed air space consolidation from aspiration or infection. 3.  Atherosclerosis.    Medications: Scheduled Meds:   . amiodarone  150 mg Intravenous Once  . aspirin  325 mg Oral Daily  . atorvastatin  40 mg Oral q1800  . docusate sodium  100 mg Oral BID  . enoxaparin (LOVENOX) injection  40 mg Subcutaneous Q12H  . feeding supplement  1 Container Oral BID BM  . ferrous sulfate  325 mg Oral Q breakfast  . furosemide  40 mg Intravenous Daily  . loratadine  10 mg Oral Daily  . metoprolol succinate  50 mg Oral Daily  . pantoprazole  40 mg Oral Q1200  . piperacillin-tazobactam (ZOSYN)  IV  3.375 g Intravenous Q8H  . potassium chloride SA  20 mEq Oral Daily  . sodium chloride  3 mL Intravenous Q12H  . sodium chloride  3 mL Intravenous Q12H  . vancomycin  1,000 mg Intravenous Q24H  . vitamin C  1,000 mg Oral Daily   Continuous Infusions:   . amiodarone (NEXTERONE PREMIX) 360 mg/200 mL dextrose 60 mg/hr (07/21/11 1114)   And  . amiodarone (NEXTERONE PREMIX) 360 mg/200 mL dextrose 30 mg/hr (07/22/11 0245)  . diltiazem (CARDIZEM) infusion 10 mg/hr (07/22/11 0245)   PRN Meds:.sodium chloride, ondansetron (ZOFRAN)  IV, ondansetron, sodium chloride  Assessment/Plan:  Acute on chronic systolic CHF (congestive heart failure), NYHA class 3  - slight clinical improvement this AM with saturations in low 90's  - will continue to monitor pt in SDU and may be able to transfer to tele this afternoon - will continue Lasix PO - strict I's and O's, daily weights   Atrial Fibrillation with RVR  - started on amiodarone and plan to switch to PO - increased Lovenox to full dose  - plan to transition to PO Cardizem low dose today  Hypotension  - unclear etiology but appears to be chronic medical problem for the patient  - WBC are slowly trending down    Atherosclerosis of native arteries of the extremities with intermittent claudication   Acute on chronic respiratory failure secondary to COPD (chronic obstructive pulmonary disease)  - albuterol nebulizer ordered prn  - narrow down abx  Fever  - no resolved and WBC trending down - narrow down ABX  Hyperglycemia  - A1C stable and at normal target   Severe protein-calorie malnutrition  - multifactorial in nature and secondary to progressive failure to thrive, underlying cardio and pulmonary conditions   EDUCATION  - test results and diagnostic studies were discussed with patient  - patient verbalized the understanding  - questions were answered at the bedside and contact information was provided for additional questions or concerns    LOS: 4 days   MAGICK-Shagun Wordell 07/22/2011, 8:37 AM  [facility] Pager: [phone] \

## 2011-07-23 LAB — URINE CULTURE: Culture  Setup Time: 201306010608

## 2011-07-23 MED ORDER — POTASSIUM CHLORIDE CRYS ER 20 MEQ PO TBCR
40.0000 meq | EXTENDED_RELEASE_TABLET | Freq: Once | ORAL | Status: AC
  Administered 2011-07-23: 40 meq via ORAL
  Filled 2011-07-23: qty 2

## 2011-07-23 MED ORDER — POTASSIUM CHLORIDE CRYS ER 20 MEQ PO TBCR
40.0000 meq | EXTENDED_RELEASE_TABLET | Freq: Every day | ORAL | Status: DC
Start: 2011-07-24 — End: 2011-07-26
  Administered 2011-07-24 – 2011-07-26 (×3): 40 meq via ORAL
  Filled 2011-07-23 (×3): qty 2

## 2011-07-23 NOTE — Progress Notes (Signed)
Subjective:  Feeling better this am No CP, no SOB Less nausea with change in medication Converted to NSR  Objective:  Vital Signs in the last 24 hours: Temp:  [97.3 F (36.3 C)-98.3 F (36.8 C)] 98.2 F (36.8 C) (06/02 0522) Pulse Rate:  [37-148] 98  (06/02 0522) Resp:  [17-29] 20  (06/02 0522) BP: (93-117)/(19-71) 106/58 mmHg (06/02 0522) SpO2:  [92 %-100 %] 93 % (06/02 0522) Weight:  [43.455 kg (95 lb 12.8 oz)-44.226 kg (97 lb 8 oz)] 43.455 kg (95 lb 12.8 oz) (06/02 0522)  Intake/Output from previous day: 06/01 0701 - 06/02 0700 In: 846.8 [P.O.:600; I.V.:246.8] Out: 1400 [Urine:1400]   Physical Exam: General: Thin, elderly, pleasant in no acute distress. Head:  Normocephalic and atraumatic. Lungs: Clear to auscultation and percussion. Heart: Normal S1 and S2.  Soft SM, no rubs or gallops.  Abdomen: soft, non-tender, positive bowel sounds. Extremities: No clubbing or cyanosis. No edema. Neurologic: Alert and oriented x 3.    Lab Results:  Basename 07/22/11 0843 07/21/11 0325  WBC 12.4* 13.6*  HGB 11.6* 11.5*  PLT 298 242    Basename 07/22/11 0843 07/21/11 0325  NA 136 133*  K 3.0* 3.7  CL 99 98  CO2 26 25  GLUCOSE 112* 115*  BUN 18 27*  CREATININE 0.75 0.90   Telemetry: NSR now Personally viewed.   Cardiac Studies:  EF 35%  Assessment/Plan:  Principal Problem:  *Acute on chronic systolic CHF (congestive heart failure), NYHA class 3 Active Problems:  Atherosclerosis of native arteries of the extremities with intermittent claudication  COPD (chronic obstructive pulmonary disease)  Hypertension  Elevated troponin  Severe protein-calorie malnutrition  Failure to thrive in adult  AFIB  - amiodarone 200 BID  - ASA (does not want coumadin see prior note)  - Off metoprolol and on low dose cardizem. Feels better she thinks off of metoprolol. Remember however that she does have an EF 35% and we need to be careful with diltiazem. Bb normally indicated in  decreased EF.    Acute on chronic systolic heart failure  - No ACE due to hypotension  - Recent change from Bb to Ca++ blocker as above  - Lasix 40 BID  - Euvolemic  Hyperlipidemia  - continue atorvastatin  Hypokalemia  - replete per primary team.  FTT    [name] 07/23/2011, 8:52 AM

## 2011-07-23 NOTE — Progress Notes (Signed)
Patient ID: [name], female   DOB: Dec 12, 1929, 78 y.o.   MRN: [id number]  Subjective: No events overnight. Patient denies chest pain, shortness of breath, abdominal pain.  Objective:  Vital signs in last 24 hours:  Filed Vitals:   07/22/11 2000 07/22/11 2101 07/23/11 0235 07/23/11 0522  BP: 105/56 98/58 109/71 106/58  Pulse: 87 92 85 98  Temp: 97.3 F (36.3 C) 98 F (36.7 C) 98.3 F (36.8 C) 98.2 F (36.8 C)  TempSrc: Oral Oral Oral Oral  Resp: 26 21 22 20   Height:  5\' 5"  (1.651 m)    Weight:  44.226 kg (97 lb 8 oz)  43.455 kg (95 lb 12.8 oz)  SpO2: 95% 100% 92% 93%    Intake/Output from previous day:  Gross per 24 hour  Intake    820 ml  Output   1526 ml  Net   -706 ml    Physical Exam: General: Alert, awake, oriented x3, in no acute distress. HEENT: No bruits, no goiter. Moist mucous membranes, no scleral icterus, no conjunctival pallor. Heart: Regular rate and rhythm, S1/S2 +, no murmurs, rubs, gallops. Lungs: Clear to auscultation bilaterally with minimal bibasilar crackles. No wheezing, no rhonchi, no rales.  Abdomen: Soft, nontender, nondistended, positive bowel sounds. Extremities: No clubbing or cyanosis, no pitting edema,  positive pedal pulses. Neuro: Grossly nonfocal.  Lab Results: Lab 07/22/11 0843 07/21/11 0325 07/20/11 0327 07/19/11 0309 07/18/11 0505 07/18/11 0135  WBC 12.4* 13.6* 16.0* 17.6* 18.9* --  HGB 11.6* 11.5* 11.5* 12.0 13.6 --  HCT 37.3 37.0 37.1 39.4 43.6 --  PLT 298 242 295 319 342 --   Lab 07/22/11 0843 07/21/11 0325 07/20/11 0327 07/19/11 0309 07/18/11 0235  NA 136 133* 137 134* 137  K 3.0* 3.7 4.2 4.5 3.8  CL 99 98 102 101 99  CO2 26 25 26 24 22   GLUCOSE 112* 115* 123* 117* 273*  BUN 18 27* 25* 28* 26*  CREATININE 0.75 0.90 0.85 0.86 --  CALCIUM 8.4 8.7 8.8 8.7 9.3   Cardiac markers:  Lab 07/19/11 0309 07/18/11 1902 07/18/11 1207  CKMB 3.7 4.5* 5.5*  TROPONINI <0.30 <0.30 <0.30  MYOGLOBIN -- -- --    Studies/Results: No results found.  Medications: Scheduled Meds:   . amiodarone  200 mg Oral BID  . aspirin  325 mg Oral Daily  . atorvastatin  40 mg Oral q1800  . azithromycin  250 mg Oral Daily  . diltiazem  30 mg Oral Q6H  . docusate sodium  100 mg Oral BID  . enoxaparn injection  40 mg Subcutaneous Q12H  . ferrous sulfate  325 mg Oral Q breakfast  . furosemide  40 mg Oral BID  . loratadine  10 mg Oral Daily  . pantoprazole  40 mg Oral Q1200  . potassium chloride SA  40 mEq Oral Daily   Continuous Infusions:   . diltiazem (CARDIZEM) infusion 10 mg/hr (07/22/11 0245)  . DISCONTD: amiodarone (NEXTERONE PREMIX) 360 mg/200 mL dextrose 30 mg/hr (07/22/11 1100)   PRN Meds:.sodium chloride, ondansetron (ZOFRAN) IV, ondansetron, sodium chloride  Assessment/Plan:  Acute on chronic systolic CHF (congestive heart failure), NYHA class 3  - slight clinical improvement this AM with saturations in low 90's  - will continue to monitor on telemetry - will continue Lasix PO  - strict I's and O's, daily weights   Atrial Fibrillation with RVR  - converted to NSR this AM - continue amiodarone 200 mg PO BID - increased Lovenox  to full dose  - continue PO Cardizem low dose today   Hypotension  - unclear etiology but appears to be chronic medical problem for the patient  - slightly improved   Atherosclerosis of native arteries of the extremities with intermittent claudication  Acute on chronic respiratory failure secondary to COPD (chronic obstructive pulmonary disease)  - albuterol nebulizer ordered prn  - narrowed down abx   Fever  - now resolved and WBC trending down   Hyperglycemia  - A1C stable and at normal target   Severe protein-calorie malnutrition  - multifactorial in nature and secondary to progressive failure to thrive, underlying cardio and pulmonary conditions   EDUCATION  - test results and diagnostic studies were discussed with patient  - patient verbalized  the understanding  - questions were answered at the bedside and contact information was provided for additional questions or concerns  - OOB to chair today   LOS: 5 days   MAGICK-Millette Halberstam 07/23/2011, 11:35 AM  [facility] Pager: [phone]

## 2011-07-24 DIAGNOSIS — J438 Other emphysema: Secondary | ICD-10-CM

## 2011-07-24 DIAGNOSIS — R627 Adult failure to thrive: Secondary | ICD-10-CM

## 2011-07-24 DIAGNOSIS — I70209 Unspecified atherosclerosis of native arteries of extremities, unspecified extremity: Secondary | ICD-10-CM

## 2011-07-24 DIAGNOSIS — I509 Heart failure, unspecified: Secondary | ICD-10-CM

## 2011-07-24 LAB — CBC
MCHC: 30.2 g/dL (ref 30.0–36.0)
Platelets: 378 10*3/uL (ref 150–400)
RDW: 15.2 % (ref 11.5–15.5)
WBC: 12.7 10*3/uL — ABNORMAL HIGH (ref 4.0–10.5)

## 2011-07-24 LAB — BASIC METABOLIC PANEL
Calcium: 9 mg/dL (ref 8.4–10.5)
GFR calc Af Amer: 78 mL/min — ABNORMAL LOW (ref >90)
GFR calc non Af Amer: 67 mL/min — ABNORMAL LOW (ref >90)
Potassium: 3.4 mEq/L — ABNORMAL LOW (ref 3.5–5.1)
Sodium: 139 mEq/L (ref 135–145)

## 2011-07-24 LAB — MAGNESIUM: Magnesium: 1.9 mg/dL (ref 1.5–2.5)

## 2011-07-24 MED ORDER — POTASSIUM CHLORIDE CRYS ER 20 MEQ PO TBCR
40.0000 meq | EXTENDED_RELEASE_TABLET | ORAL | Status: AC
  Administered 2011-07-24 (×2): 40 meq via ORAL
  Filled 2011-07-24 (×2): qty 2

## 2011-07-24 MED ORDER — METOPROLOL TARTRATE 1 MG/ML IV SOLN
5.0000 mg | Freq: Once | INTRAVENOUS | Status: AC
  Administered 2011-07-24: 5 mg via INTRAVENOUS
  Filled 2011-07-24: qty 5

## 2011-07-24 MED ORDER — METOPROLOL SUCCINATE ER 50 MG PO TB24
50.0000 mg | ORAL_TABLET | Freq: Every day | ORAL | Status: DC
  Administered 2011-07-24 – 2011-07-26 (×3): 50 mg via ORAL
  Filled 2011-07-24 (×3): qty 1

## 2011-07-24 NOTE — Progress Notes (Addendum)
Subjective:  Feeling better today No CP, no SOB HR increases with activity. Just had bowel movement   Objective:  Vital Signs in the last 24 hours: Temp:  [98.3 F (36.8 C)-98.6 F (37 C)] 98.3 F (36.8 C) (06/03 1300) Pulse Rate:  [80-147] 147  (06/03 1300) Resp:  [20-22] 20  (06/03 1300) BP: (106-115)/(52-74) 108/56 mmHg (06/03 1247) SpO2:  [97 %-98 %] 97 % (06/03 1300) Weight:  [42.003 kg (92 lb 9.6 oz)] 42.003 kg (92 lb 9.6 oz) (06/03 0554)  Intake/Output from previous day: 06/02 0701 - 06/03 0700 In: 960 [P.O.:960] Out: 2627 [Urine:2625; Stool:2]   Physical Exam: General: Thin, elderly, pleasant in no acute distress. Head:  Normocephalic and atraumatic. Lungs: Slightly decreased BS at the right base. Heart: Tachycardic, irregular Abdomen: soft, non-tender, positive bowel sounds. Extremities:  No edema. Neurologic: Alert and oriented.    Lab Results:  Basename 07/24/11 0440 07/22/11 0843  WBC 12.7* 12.4*  HGB 12.2 11.6*  PLT 378 298    Basename 07/24/11 0440 07/22/11 0843  NA 139 136  K 3.4* 3.0*  CL 98 99  CO2 32 26  GLUCOSE 102* 112*  BUN 16 18  CREATININE 0.81 0.75   Telemetry: NSR now Personally viewed.   Cardiac Studies:  EF 35%  Assessment/Plan:  Principal Problem:  *Acute on chronic systolic CHF (congestive heart failure), NYHA class 3 Active Problems:  Atherosclerosis of native arteries of the extremities with intermittent claudication  COPD (chronic obstructive pulmonary disease)  Hypertension  Elevated troponin  Severe protein-calorie malnutrition  Failure to thrive in adult  AFIB/AFlutter  - amiodarone 200 BID  - ASA (does not want coumadin see prior note)  - Was Off metoprolol and on low dose cardizem for rate control. Feels better she thinks off of metoprolol. Toprol XL restarted today,  Acute on chronic systolic heart failure  - No ACE due to hypotension  beta blocker restarted for rate control.     [name]  S. 07/24/2011, 2:33 PM

## 2011-07-24 NOTE — Progress Notes (Signed)
Notified by monitor tech that pt's HR was sustaining in the 130-140 range. Pt had no complaints at the time and was asymptomatic. Manual BP was 106/52. When I was attempting to page MD, Luretha Rued., NP was making rounds on this pt. She was made aware and order received to give 5mg  metoprolol IV for one dose. Medication given per order and will continue to monitor pt.  Arta Bruce [facility] 1610 07/24/2011
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
## 2011-07-24 NOTE — Progress Notes (Signed)
I was informed by nursing student that he found a medication in the pt's bed. He disposed of this medication as we did not know what it was at the time. However, after he gave pt her 10:00am meds he reported later to me that he noticed that the pill he had found looked just like the amiodarone pill that he had just given. So it is possible that pt missed her 10pm dose of amiodarone on 07/23/11. This could also possibly be the cause of pt's HR increase today. Will continue to monitor pt.   Arta Bruce [facility] 3:04 PM 07/24/2011

## 2011-07-24 NOTE — Progress Notes (Signed)
Subjective: Awake alert reports mild brief episode of nausea this am improved with crackers. No other complaints. No CP or SOB.   Objective: Vital signs Filed Vitals:   07/23/11 0522 07/23/11 1400 07/23/11 2215 07/24/11 0554  BP: 106/58 125/67 106/52 115/74  Pulse: 98 96 94 80  Temp: 98.2 F (36.8 C) 98 F (36.7 C) 98.3 F (36.8 C) 98.5 F (36.9 C)  TempSrc: Oral Oral Oral Oral  Resp: 20 22 20 20   Height:      Weight: 43.455 kg (95 lb 12.8 oz)   42.003 kg (92 lb 9.6 oz)  SpO2: 93% 96% 98% 97%   Weight change: -2.223 kg (-4 lb 14.4 oz) Last BM Date: 07/23/11  Intake/Output from previous day: 06/02 0701 - 06/03 0700 In: 960 [P.O.:960] Out: 2627 [Urine:2625; Stool:2]     Physical Exam: General: Alert, awake, oriented x3, in no acute distress. HEENT: No bruits, no goiter. Normocephalic, PERRL, mucus membranes slightly pale/moist.  Heart: irregullarly irregular, without murmurs, rubs, gallops.No LEE Lungs:Normal effort but slightly shallow. Breath sounds clear to auscultation bilaterally.No wheeze.  Abdomen: Soft, nontender, nondistended, positive bowel sounds. Extremities: No clubbing cyanosis or edema with positive pedal pulses. Neuro: Grossly intact, nonfocal. Speech clear.     Lab Results: Basic Metabolic Panel:  Basename 07/24/11 0440 07/22/11 0843  NA 139 136  K 3.4* 3.0*  CL 98 99  CO2 32 26  GLUCOSE 102* 112*  BUN 16 18  CREATININE 0.81 0.75  CALCIUM 9.0 8.4  MG -- --  PHOS -- --   Liver Function Tests: No results found for this basename: AST:2,ALT:2,ALKPHOS:2,BILITOT:2,PROT:2,ALBUMIN:2 in the last 72 hours No results found for this basename: LIPASE:2,AMYLASE:2 in the last 72 hours No results found for this basename: AMMONIA:2 in the last 72 hours CBC:  Basename 07/24/11 0440 07/22/11 0843  WBC 12.7* 12.4*  NEUTROABS -- --  HGB 12.2 11.6*  HCT 40.4 37.3  MCV 83.8 82.2  PLT 378 298   Cardiac Enzymes: No results found for this basename:  CKTOTAL:3,CKMB:3,CKMBINDEX:3,TROPONINI:3 in the last 72 hours BNP: No results found for this basename: PROBNP:3 in the last 72 hours D-Dimer: No results found for this basename: DDIMER:2 in the last 72 hours CBG: No results found for this basename: GLUCAP:6 in the last 72 hours Hemoglobin A1C: No results found for this basename: HGBA1C in the last 72 hours Fasting Lipid Panel: No results found for this basename: CHOL,HDL,LDLCALC,TRIG,CHOLHDL,LDLDIRECT in the last 72 hours Thyroid Function Tests: No results found for this basename: TSH,T4TOTAL,FREET4,T3FREE,THYROIDAB in the last 72 hours Anemia Panel: No results found for this basename: VITAMINB12,FOLATE,FERRITIN,TIBC,IRON,RETICCTPCT in the last 72 hours Coagulation: No results found for this basename: LABPROT:2,INR:2 in the last 72 hours Urine Drug Screen: Drugs of Abuse  No results found for this basename: labopia, cocainscrnur, labbenz, amphetmu, thcu, labbarb    Alcohol Level: No results found for this basename: ETH:2 in the last 72 hours Urinalysis:  Basename 07/22/11 0033  COLORURINE YELLOW  LABSPEC 1.027  PHURINE 5.5  GLUCOSEU NEGATIVE  HGBUR NEGATIVE  BILIRUBINUR NEGATIVE  KETONESUR NEGATIVE  PROTEINUR 30*  UROBILINOGEN 0.2  NITRITE NEGATIVE  LEUKOCYTESUR TRACE*   Misc. Labs:  Recent Results (from the past 240 hour(s))  MRSA PCR SCREENING     Status: Normal   Collection Time   07/19/11  8:23 AM      Component Value Range Status Comment   MRSA by PCR NEGATIVE  NEGATIVE  Final   CULTURE, BLOOD (ROUTINE X 2)  Status: Normal (Preliminary result)   Collection Time   07/20/11  5:20 PM      Component Value Range Status Comment   Specimen Description BLOOD LEFT HAND   Final    Special Requests BOTTLES DRAWN AEROBIC ONLY 3.5CC   Final    Culture  Setup Time [id number]   Final    Culture     Final    Value:        BLOOD CULTURE RECEIVED NO GROWTH TO DATE CULTURE WILL BE HELD FOR 5 DAYS BEFORE ISSUING A FINAL  NEGATIVE REPORT   Report Status PENDING   Incomplete   CULTURE, BLOOD (ROUTINE X 2)     Status: Normal (Preliminary result)   Collection Time   07/20/11  5:25 PM      Component Value Range Status Comment   Specimen Description BLOOD LEFT HAND   Final    Special Requests BOTTLES DRAWN AEROBIC AND ANAEROBIC 5CC   Final    Culture  Setup Time [id number]   Final    Culture     Final    Value:        BLOOD CULTURE RECEIVED NO GROWTH TO DATE CULTURE WILL BE HELD FOR 5 DAYS BEFORE ISSUING A FINAL NEGATIVE REPORT   Report Status PENDING   Incomplete   URINE CULTURE     Status: Normal   Collection Time   07/22/11 12:33 AM      Component Value Range Status Comment   Specimen Description URINE, RANDOM   Final    Special Requests NONE   Final    Culture  Setup Time [id number]   Final    Colony Count 6,000 COLONIES/ML   Final    Culture INSIGNIFICANT GROWTH   Final    Report Status 07/23/2011 FINAL   Final     Studies/Results: No results found.  Medications: Scheduled Meds:   . amiodarone  200 mg Oral BID  . antiseptic oral rinse  15 mL Mouth Rinse BID  . aspirin  325 mg Oral Daily  . atorvastatin  40 mg Oral q1800  . azithromycin  250 mg Oral Daily  . diltiazem  30 mg Oral Q6H  . docusate sodium  100 mg Oral BID  . enoxaparin (LOVENOX) injection  40 mg Subcutaneous Q12H  . feeding supplement  1 Container Oral BID BM  . ferrous sulfate  325 mg Oral Q breakfast  . furosemide  40 mg Oral BID  . loratadine  10 mg Oral Daily  . metoprolol  5 mg Intravenous Once  . pantoprazole  40 mg Oral Q1200  . potassium chloride SA  40 mEq Oral Daily  . potassium chloride  40 mEq Oral Once  . potassium chloride  40 mEq Oral Q4H  . sodium chloride  3 mL Intravenous Q12H  . sodium chloride  3 mL Intravenous Q12H  . vitamin C  1,000 mg Oral Daily  . DISCONTD: potassium chloride SA  20 mEq Oral Daily   Continuous Infusions:   . DISCONTD: diltiazem (CARDIZEM) infusion 10 mg/hr (07/22/11 0245)    PRN Meds:.sodium chloride, ondansetron (ZOFRAN) IV, ondansetron, sodium chloride  Assessment/Plan:  Principal Problem:  *Acute on chronic systolic CHF (congestive heart failure), NYHA class 3 Active Problems:  Atherosclerosis of native arteries of the extremities with intermittent claudication  COPD (chronic obstructive pulmonary disease)  Hypertension  Elevated troponin  Severe protein-calorie malnutrition  Failure to thrive in adult  Acute on chronic systolic CHF (congestive heart failure),  NYHA class 3  - clinical improvement this AM with saturations in high 90's on 3L - will continue to monitor on telemetry  - will continue Lasix PO  - strict I's and O's, daily weights . Volume status 4.8L. Wt 42.0kg from 43.4kg yesterday.  Atrial Fibrillation with RVR  - Back to afib with RVR 7am . SBP 115. No CP, no sob. Will give 5mg  IV lopressor. Consider resuming home BB of Metoprolol 24 hour 5omg. BP tends to be soft so will monitor closely.  - continue amiodarone 200 mg PO BID  - continue Lovenox to full dose  - continue PO Cardizem.  -defer to cardiology recommendations.  Mild hypokalemia. Will replete and recheck in am. will check magnesium Hypotension  - unclear etiology but appears to be chronic medical problem for the patient  - See #2.   Atherosclerosis of native arteries of the extremities with intermittent claudication  Acute on chronic respiratory failure secondary to COPD (chronic obstructive pulmonary disease)  - albuterol nebulizer ordered prn  - today will be 7 days of antibiotics. Will discontinue after today's dose.  Fever  - now resolved and WBC trending down  Hyperglycemia  - A1C stable and at normal target  Severe protein-calorie malnutrition  - multifactorial in nature and secondary to progressive failure to thrive, underlying cardio and pulmonary conditions. Reports improved appetite.  Dispo: Hopefully in 1-2 days once #2 resolved. Will request PT/Ot .        LOS: 6 days   [facility] M 07/24/2011, 7:58 AM

## 2011-07-24 NOTE — Progress Notes (Signed)
SATURATION QUALIFICATIONS:  Patient Saturations on Room Air at Rest = 92%  Patient Saturations on Room Air while Ambulating = 85%   

## 2011-07-24 NOTE — Progress Notes (Signed)
Pt was seen and examined at bedside. I have reviewed labs and vitals which are stable and pt is hemodynamically stable despite tachycardia. I agree with the above's assessment and plan. We will add Metoprolol as per home medication regimen and will continue to monitor vitals on telemetry. Pt is looking better and has better appetite.  Debbora Presto  [facility], pager #: [phone] Main office number: [phone]

## 2011-07-24 NOTE — Progress Notes (Signed)
PT Cancellation Note  Evaluation cancelled today due to medical issues with patient which prohibited therapy.  Pt with elevated HR 130-155 bpm on monitor at rest.  Will check back tomorrow for evaluation.   Qusai Kem,KATHrine E 07/24/2011, 2:16 PM Pager: [phone]

## 2011-07-25 DIAGNOSIS — I509 Heart failure, unspecified: Secondary | ICD-10-CM

## 2011-07-25 DIAGNOSIS — J438 Other emphysema: Secondary | ICD-10-CM

## 2011-07-25 DIAGNOSIS — R627 Adult failure to thrive: Secondary | ICD-10-CM

## 2011-07-25 DIAGNOSIS — I70209 Unspecified atherosclerosis of native arteries of extremities, unspecified extremity: Secondary | ICD-10-CM

## 2011-07-25 LAB — URINALYSIS, ROUTINE W REFLEX MICROSCOPIC
Ketones, ur: NEGATIVE mg/dL
Leukocytes, UA: NEGATIVE
Nitrite: NEGATIVE
pH: 7.5 (ref 5.0–8.0)

## 2011-07-25 LAB — CBC
MCH: 25.4 pg — ABNORMAL LOW (ref 26.0–34.0)
Platelets: 384 10*3/uL (ref 150–400)
RBC: 4.61 MIL/uL (ref 3.87–5.11)
WBC: 15.7 10*3/uL — ABNORMAL HIGH (ref 4.0–10.5)

## 2011-07-25 LAB — BASIC METABOLIC PANEL
Calcium: 9.6 mg/dL (ref 8.4–10.5)
GFR calc Af Amer: 79 mL/min — ABNORMAL LOW (ref >90)
GFR calc non Af Amer: 68 mL/min — ABNORMAL LOW (ref >90)
Glucose, Bld: 105 mg/dL — ABNORMAL HIGH (ref 70–99)
Potassium: 4.3 mEq/L (ref 3.5–5.1)
Sodium: 139 mEq/L (ref 135–145)

## 2011-07-25 NOTE — Progress Notes (Signed)
SUBJECTIVE:  Feels good today.  Denies any SOB or CP  OBJECTIVE:   Vitals:   Filed Vitals:   07/24/11 1947 07/24/11 1948 07/24/11 2052 07/25/11 0441  BP:   102/68 116/69  Pulse:   89 89  Temp:   98.5 F (36.9 C) 98.1 F (36.7 C)  TempSrc:   Oral Oral  Resp:   20 20  Height:      Weight:    40.869 kg (90 lb 1.6 oz)  SpO2: 85% 95% 96% 96%   I&O's:   Intake/Output Summary (Last 24 hours) at 07/25/11 1017 Last data filed at 07/25/11 0702  Gross per 24 hour  Intake    243 ml  Output   1647 ml  Net  -1404 ml   TELEMETRY: Reviewed telemetry pt in NSR     PHYSICAL EXAM General: Well developed, well nourished, in no acute distress Head: Eyes PERRLA, No xanthomas.   Normal cephalic and atramatic  Lungs:   Clear bilaterally to auscultation and percussion. Heart:   HRRR S1 S2 Pulses are 2+ & equal. Abdomen: Bowel sounds are positive, abdomen soft and non-tender without masses Extremities:   No clubbing, cyanosis or edema.  DP +1 Neuro: Alert and oriented X 3. Psych:  Good affect, responds appropriately   LABS: Basic Metabolic Panel:  Basename 07/25/11 0420 07/24/11 0440  NA 139 139  K 4.3 3.4*  CL 100 98  CO2 32 32  GLUCOSE 105* 102*  BUN 19 16  CREATININE 0.80 0.81  CALCIUM 9.6 9.0  MG -- 1.9  PHOS -- --   Liver Function Tests: No results found for this basename: AST:2,ALT:2,ALKPHOS:2,BILITOT:2,PROT:2,ALBUMIN:2 in the last 72 hours No results found for this basename: LIPASE:2,AMYLASE:2 in the last 72 hours CBC:  Basename 07/25/11 0420 07/24/11 0440  WBC 15.7* 12.7*  NEUTROABS -- --  HGB 11.7* 12.2  HCT 38.5 40.4  MCV 83.5 83.8  PLT 384 378     RADIOLOGY: Dg Chest Port 1 View  07/21/2011  *RADIOLOGY REPORT*  Clinical Data: Congestive heart failure.  PORTABLE CHEST - 1 VIEW  Comparison: 07/19/2011.  Findings: Lung volumes are normal.  There are bibasilar opacities which may represent areas of atelectasis and/or consolidation. Small - moderate left and small  right-sided pleural effusions are similar to the prior examination.  There is cephalization of the pulmonary vasculature and indistinctness of the interstitial markings with some patchy airspace opacities throughout the lungs bilaterally, likely to represent moderate pulmonary edema (superimposed infection is difficult to exclude).  Heart size is mildly enlarged. The patient is rotated to the right on today's exam, resulting in distortion of the mediastinal contours and reduced diagnostic sensitivity and specificity for mediastinal pathology.  Atherosclerotic calcifications within the arch of the aorta.  IMPRESSION: 1.  Findings again suggestive of moderate pulmonary edema from congestive heart failure with small - moderate left and small right- sided pleural effusions. 2.  Additional bibasilar opacities may represent areas of extensive atelectasis or superimposed air space consolidation from aspiration or infection. 3.  Atherosclerosis.  Original Report Authenticated By: Florencia Reasons, M.D.   Dg Chest Port 1 View  07/19/2011  *RADIOLOGY REPORT*  Clinical Data: Pleural effusions.  PORTABLE CHEST - 1 VIEW  Comparison: 07/18/2011.  Findings: Trachea is midline.  Heart size stable.  Thoracic aorta is calcified.  There is diffuse bilateral air space disease with bilateral pleural effusions.  Left lower lobe collapse/consolidation.  Old right rib fractures.  IMPRESSION: Congestive heart failure with left  lower lobe collapse/consolidation.  Original Report Authenticated By: Reyes Ivan, M.D.   Dg Chest Port 1 View  07/18/2011  *RADIOLOGY REPORT*  Clinical Data: Chest pain.  Short of breath.  PORTABLE CHEST - 1 VIEW  Comparison: 07/10/2011.  Findings: Cardiomegaly.  Marked worsening aeration with honeycomb like opacities throughout both lung fields representing probable pulmonary edema.  Moderate sized bilateral pleural effusions. Calcified tortuous aorta.  No acute osseous findings.  IMPRESSION: Marked  worsening aeration.  Probable interval development of CHF.  Original Report Authenticated By: Elsie Stain, M.D.   Portable Chest 1 View  07/10/2011  *RADIOLOGY REPORT*  Clinical Data: Shortness of breath.  PORTABLE CHEST - 1 VIEW  Comparison: 07/09/2011  Findings: There is decreased interstitial thickening which may represent decreased edema.  Persistent basilar densities are suggestive for atelectasis and pleural fluid.  There is persistent consolidation at the left lung base.  Heart size is upper limits of normal.  IMPRESSION: Findings suggest decreased interstitial pulmonary edema.  Persistent basilar densities.  Cannot exclude consolidation at the left lung base.  Original Report Authenticated By: Richarda Overlie, M.D.   Dg Chest Portable 1 View  07/09/2011  *RADIOLOGY REPORT*  Clinical Data: Respiratory distress, history of COPD  PORTABLE CHEST - 1 VIEW  Comparison: 12/27/2010; 11/27/2008  Findings: Grossly unchanged enlarged cardiac silhouette and mediastinal contours with atherosclerotic calcifications within the aortic arch.  The lungs remain hyperinflated.  There is persistent blunting of bilateral costophrenic angles, left greater than right suggestive of small bilateral effusions.  Pulmonary vasculature is indistinct with cephalization of flow.  Bibasilar opacities, left greater than right.  No pneumothorax.  Grossly unchanged bones. Right axillary surgical clips.  IMPRESSION: 1.  Overall findings most suggestive of pulmonary edema with small bilateral effusions, left greater than right. 2.  Bibasilar opacities, left greater than right, atelectasis versus infiltrate. A follow-up chest radiograph in 4 to 6 weeks after treatment is recommended to ensure resolution.  3.  Hyperexpanded lungs compatible with emphysema.  Original Report Authenticated By: Waynard Reeds, M.D.      ASSESSMENT:  Principal Problem:  *Acute on chronic systolic CHF (congestive heart failure), NYHA class 3  Active  Problems:  Atherosclerosis of native arteries of the extremities with intermittent claudication  COPD (chronic obstructive pulmonary disease)  Hypertension  Elevated troponin  Severe protein-calorie malnutrition  Failure to thrive in adult   PLAN:  AFIB/AFlutter - maintaining NSR - amiodarone 200 BID  - ASA (does not want coumadin see prior note)  - Toprol restarted for rate control Acute on chronic systolic heart failure  - No ACE due to hypotension      Quintella Reichert, MD  07/25/2011  10:17 AM

## 2011-07-25 NOTE — Progress Notes (Signed)
Pt was seen and examined at bedside. I have reviewed labs and vitals which are stable and pt is hemodynamically stable. I agree with the above's assessment and plan.  Pt is stable this AM and reports feeling better. Will continue  to monitor on telemetry additional day and if pt remains stable with BP and HR at target range will plan on discharging in the morning.  Debbora Presto  [facility], pager #: [phone] Main office number: [phone]

## 2011-07-25 NOTE — Progress Notes (Signed)
Subjective: Dozing in bed. Easily aroused. Denies pain/discomfort/nausea. No events during night.   Objective: Vital signs Filed Vitals:   07/24/11 1947 07/24/11 1948 07/24/11 2052 07/25/11 0441  BP:   102/68 116/69  Pulse:   89 89  Temp:   98.5 F (36.9 C) 98.1 F (36.7 C)  TempSrc:   Oral Oral  Resp:   20 20  Height:      Weight:    40.869 kg (90 lb 1.6 oz)  SpO2: 85% 95% 96% 96%   Weight change: -1.134 kg (-2 lb 8 oz) Last BM Date: 07/24/11  Intake/Output from previous day: 06/03 0701 - 06/04 0700 In: 483 [P.O.:480; I.V.:3] Out: 1972 [Urine:1970; Stool:2] Total I/O In: -  Out: 75 [Urine:75]   Physical Exam: General: Alert, awake, oriented x3, in no acute distress. HEENT: No bruits, no goiter. PERRL, mucus membranes moist/pink.  Heart: Regular rate and rhythm, without murmurs, rubs, gallops. No LEE Lungs:Normal effort.  Clear to auscultation bilaterally. Abdomen: Soft, nontender, nondistended, positive bowel sounds. Extremities: No clubbing cyanosis or edema with positive pedal pulses. Neuro: Grossly intact, nonfocal. Speech clear    Lab Results: Basic Metabolic Panel:  Basename 07/25/11 0420 07/24/11 0440  NA 139 139  K 4.3 3.4*  CL 100 98  CO2 32 32  GLUCOSE 105* 102*  BUN 19 16  CREATININE 0.80 0.81  CALCIUM 9.6 9.0  MG -- 1.9  PHOS -- --   Liver Function Tests: No results found for this basename: AST:2,ALT:2,ALKPHOS:2,BILITOT:2,PROT:2,ALBUMIN:2 in the last 72 hours No results found for this basename: LIPASE:2,AMYLASE:2 in the last 72 hours No results found for this basename: AMMONIA:2 in the last 72 hours CBC:  Basename 07/25/11 0420 07/24/11 0440  WBC 15.7* 12.7*  NEUTROABS -- --  HGB 11.7* 12.2  HCT 38.5 40.4  MCV 83.5 83.8  PLT 384 378   Cardiac Enzymes: No results found for this basename: CKTOTAL:3,CKMB:3,CKMBINDEX:3,TROPONINI:3 in the last 72 hours BNP: No results found for this basename: PROBNP:3 in the last 72 hours D-Dimer: No  results found for this basename: DDIMER:2 in the last 72 hours CBG: No results found for this basename: GLUCAP:6 in the last 72 hours Hemoglobin A1C: No results found for this basename: HGBA1C in the last 72 hours Fasting Lipid Panel: No results found for this basename: CHOL,HDL,LDLCALC,TRIG,CHOLHDL,LDLDIRECT in the last 72 hours Thyroid Function Tests: No results found for this basename: TSH,T4TOTAL,FREET4,T3FREE,THYROIDAB in the last 72 hours Anemia Panel: No results found for this basename: VITAMINB12,FOLATE,FERRITIN,TIBC,IRON,RETICCTPCT in the last 72 hours Coagulation: No results found for this basename: LABPROT:2,INR:2 in the last 72 hours Urine Drug Screen: Drugs of Abuse  No results found for this basename: labopia, cocainscrnur, labbenz, amphetmu, thcu, labbarb    Alcohol Level: No results found for this basename: ETH:2 in the last 72 hours Urinalysis: No results found for this basename: COLORURINE:2,APPERANCEUR:2,LABSPEC:2,PHURINE:2,GLUCOSEU:2,HGBUR:2,BILIRUBINUR:2,KETONESUR:2,PROTEINUR:2,UROBILINOGEN:2,NITRITE:2,LEUKOCYTESUR:2 in the last 72 hours Misc. Labs:  Recent Results (from the past 240 hour(s))  MRSA PCR SCREENING     Status: Normal   Collection Time   07/19/11  8:23 AM      Component Value Range Status Comment   MRSA by PCR NEGATIVE  NEGATIVE  Final   CULTURE, BLOOD (ROUTINE X 2)     Status: Normal (Preliminary result)   Collection Time   07/20/11  5:20 PM      Component Value Range Status Comment   Specimen Description BLOOD LEFT HAND   Final    Special Requests BOTTLES DRAWN AEROBIC ONLY 3.5CC  Final    Culture  Setup Time [id number]   Final    Culture     Final    Value:        BLOOD CULTURE RECEIVED NO GROWTH TO DATE CULTURE WILL BE HELD FOR 5 DAYS BEFORE ISSUING A FINAL NEGATIVE REPORT   Report Status PENDING   Incomplete   CULTURE, BLOOD (ROUTINE X 2)     Status: Normal (Preliminary result)   Collection Time   07/20/11  5:25 PM      Component Value  Range Status Comment   Specimen Description BLOOD LEFT HAND   Final    Special Requests BOTTLES DRAWN AEROBIC AND ANAEROBIC 5CC   Final    Culture  Setup Time [id number]   Final    Culture     Final    Value:        BLOOD CULTURE RECEIVED NO GROWTH TO DATE CULTURE WILL BE HELD FOR 5 DAYS BEFORE ISSUING A FINAL NEGATIVE REPORT   Report Status PENDING   Incomplete   URINE CULTURE     Status: Normal   Collection Time   07/22/11 12:33 AM      Component Value Range Status Comment   Specimen Description URINE, RANDOM   Final    Special Requests NONE   Final    Culture  Setup Time [id number]   Final    Colony Count 6,000 COLONIES/ML   Final    Culture INSIGNIFICANT GROWTH   Final    Report Status 07/23/2011 FINAL   Final     Studies/Results: No results found.  Medications: Scheduled Meds:   . amiodarone  200 mg Oral BID  . antiseptic oral rinse  15 mL Mouth Rinse BID  . aspirin  325 mg Oral Daily  . atorvastatin  40 mg Oral q1800  . azithromycin  250 mg Oral Daily  . diltiazem  30 mg Oral Q6H  . docusate sodium  100 mg Oral BID  . enoxaparin (LOVENOX) injection  40 mg Subcutaneous Q12H  . feeding supplement  1 Container Oral BID BM  . ferrous sulfate  325 mg Oral Q breakfast  . furosemide  40 mg Oral BID  . loratadine  10 mg Oral Daily  . metoprolol succinate  50 mg Oral Daily  . pantoprazole  40 mg Oral Q1200  . potassium chloride SA  40 mEq Oral Daily  . potassium chloride  40 mEq Oral Q4H  . sodium chloride  3 mL Intravenous Q12H  . sodium chloride  3 mL Intravenous Q12H  . vitamin C  1,000 mg Oral Daily   Continuous Infusions:  PRN Meds:.sodium chloride, ondansetron (ZOFRAN) IV, ondansetron, sodium chloride  Assessment/Plan:  Principal Problem:  *Acute on chronic systolic CHF (congestive heart failure), NYHA class 3 Active Problems:  Atherosclerosis of native arteries of the extremities with intermittent claudication  COPD (chronic obstructive pulmonary disease)   Hypertension  Elevated troponin  Severe protein-calorie malnutrition  Failure to thrive in adult Acute on chronic systolic CHF (congestive heart failure), NYHA class 3  Continues to  this AM with saturations in high 90's on 2L. will continue Lasix PO . Volume status neg 6.4L. Wt 40.8kg--> 42.0kg from 43.4kg 07/23/11 . Atrial Fibrillation with RVR  - NSR rate controlled this am.. Suspect yesterdays episode related to missed dose amiodorone on 07/23/11 evening.  SBP 115. No CP, no sob. Home dose BB resumed yesterday as well.  continue amiodarone 200 mg PO BID  - continue  Lovenox to full dose  - continue PO Cardizem.  -defer to cardiology recommendations.  Mild hypokalemia. Resolved.  Mag level 1.9.  Hypotension  - unclear etiology but appears to be chronic medical problem for the patient. Improving. SBP range 108-116.  - See #2.   Acute on chronic respiratory failure secondary to COPD (chronic obstructive pulmonary disease)  -  Improved but not at baseline reportedly. Will trial sats with activity. Suspect will need home 02 support at discharge. Continue albuterol nebulizer ordered prn  - completed 7 days antibiotics.   Fever  - now resolved. WBC trending up. Afebrile and non-toxic appearing. Will check urine.   Hyperglycemia  - A1C stable and at normal target  Severe protein-calorie malnutrition  - multifactorial in nature and secondary to progressive failure to thrive, underlying cardio and pulmonary conditions. Reports improved appetite.  Dispo: lives at home with husband. Will likely need HH. Will request PT/Ot eval. Will likely need home o2 support at discharge. Medically stable. Likely discharged late today or in am.     LOS: 7 days   [facility] M 07/25/2011, 9:28 AM

## 2011-07-25 NOTE — Evaluation (Signed)
Physical Therapy Evaluation Patient Details Name: [name] MRN: [id number] DOB: April 04, 1929 Today's Date: 07/25/2011 Time: 4098-1191 PT Time Calculation (min): 29 min  PT Assessment / Plan / Recommendation Clinical Impression  Pt admitted with acute on chronic congestive heart failure.  Pt SaO2 87% then dropped to 83% on 2L prior to activity.  Pt is a hard read with all vitals per nsg.  Pt maintained on 2L throughout tx and reported SOB with activity but felt better with short rest break and pursed lip breathing.  Pt also with symptoms of intermittent claudication during ambulation.  Pt would benefit from acute PT services in order to improve independence with transfers and ambulation to prepare for safe d/c home with spouse.    PT Assessment  Patient needs continued PT services    Follow Up Recommendations  Home health PT (home safety eval)    Barriers to Discharge        lEquipment Recommendations  None recommended by PT (pt declines)    Recommendations for Other Services     Frequency Min 3X/week    Precautions / Restrictions Precautions Precautions: Fall Restrictions Weight Bearing Restrictions: No   Pertinent Vitals/Pain 4/10 L LE pain with ambulation.  Pt reported pain better with rest and provided hot pack (reports using heated blanket at home for pain) and educated 20 min on and check skin      Mobility  Transfers Transfers: Sit to Stand;Stand to Sit Sit to Stand: 5: Supervision;With upper extremity assist;From bed Stand to Sit: 5: Supervision;With upper extremity assist;To chair/3-in-1 Ambulation/Gait Ambulation/Gait Assistance: 4: Min assist Ambulation Distance (Feet): 200 Feet Assistive device: None Ambulation/Gait Assistance Details: pt minA due to LOB beginning gait however, min/guard thereafter, pt politely declines use of assistive device and reports no falls at home, pt also reports pain in LEs with ambulation (likely 2* to intermittent claudication  as pt with hx of PAD and stents) and reports pain eases off with rest . HR 100 with ambulation Gait Pattern: Step-through pattern;Decreased stride length    Exercises     PT Diagnosis: Difficulty walking  PT Problem List: Decreased activity tolerance;Decreased mobility;Decreased knowledge of use of DME PT Treatment Interventions: Gait training;DME instruction;Stair training;Functional mobility training;Therapeutic activities;Therapeutic exercise;Patient/family education;Balance training   PT Goals Acute Rehab PT Goals PT Goal Formulation: With patient Time For Goal Achievement: 08/01/11 Potential to Achieve Goals: Good Pt will go Sit to Stand: with modified independence PT Goal: Sit to Stand - Progress: Goal set today Pt will go Stand to Sit: with modified independence PT Goal: Stand to Sit - Progress: Goal set today Pt will Ambulate: >150 feet;with modified independence;with least restrictive assistive device;Other (comment) (with no LOB and SaO2 >90%) PT Goal: Ambulate - Progress: Goal set today  Visit Information  Last PT Received On: 07/25/11 Assistance Needed: +1    Subjective Data  Subjective: "oh good.  I didn't get to walk yesterday because my heart rate was high."   Prior Functioning  Home Living Lives With: Spouse Type of Home: House Home Access: Stairs to enter Secretary/administrator of Steps: 4 Entrance Stairs-Rails: Can reach both Home Layout: One level Home Adaptive Equipment: None Prior Function Level of Independence: Independent Able to Take Stairs?: Yes Driving: No Vocation: Retired Musician: No difficulties    Cognition  Overall Cognitive Status: Appears within functional limits for tasks assessed/performed Arousal/Alertness: Awake/alert Orientation Level: Appears intact for tasks assessed Behavior During Session: [facility] for tasks performed    Extremity/Trunk Assessment Right  Upper Extremity Assessment RUE ROM/Strength/Tone: [facility]  for tasks assessed Left Upper Extremity Assessment LUE ROM/Strength/Tone: [facility] for tasks assessed Right Lower Extremity Assessment RLE ROM/Strength/Tone: [facility] for tasks assessed Left Lower Extremity Assessment LLE ROM/Strength/Tone: WFL for tasks assessed   Balance    End of Session PT - End of Session Equipment Utilized During Treatment: Gait belt Activity Tolerance: Patient tolerated treatment well Patient left: in chair;with call bell/phone within reach   [facility] E 07/25/2011, 10:00 AM Pager: [phone]

## 2011-07-25 NOTE — Evaluation (Signed)
Occupational Therapy Evaluation Patient Details Name: [name] MRN: [id number] DOB: 1930/02/12 Today's Date: 07/25/2011 Time: 4098-1191 OT Time Calculation (min): 23 min  OT Assessment / Plan / Recommendation Clinical Impression  Pt admitted with acute on chronic congestive heart failure. Pt displays some decreased activity tolerance compared to her usual baseline, overall decreased strength and will benefit from skilled OT services to improve independence with ADL for return home.    OT Assessment  Patient needs continued OT Services    Follow Up Recommendations  No OT follow up    Barriers to Discharge      Equipment Recommendations  None recommended by PT;Other (comment) (pt declines for PT. Will further assess for 3in1)    Recommendations for Other Services    Frequency  Min 2X/week    Precautions / Restrictions Precautions Precautions: Fall Restrictions Weight Bearing Restrictions: No        ADL  Eating/Feeding: Simulated;Independent Where Assessed - Eating/Feeding: Chair Grooming: Simulated;Set up Where Assessed - Grooming: Supported sitting Upper Body Bathing: Simulated;Chest;Right arm;Left arm;Abdomen;Set up Where Assessed - Upper Body Bathing: Unsupported sitting Lower Body Bathing: Simulated;Min guard Where Assessed - Lower Body Bathing: Supported sit to stand Upper Body Dressing: Simulated;Set up Where Assessed - Upper Body Dressing: Unsupported sitting Lower Body Dressing: Min guard;Simulated Where Assessed - Lower Body Dressing: Sopported sit to stand Toilet Transfer: Performed;Minimal assistance Toilet Transfer Method: Other (comment) (a few steps from chair to [facility]. limited o2 line) Toilet Transfer Equipment: Bedside commode Toileting - Clothing Manipulation and Hygiene: Simulated;Min guard Where Assessed - Engineer, mining and Hygiene: Standing Tub/Shower Transfer Method: Not assessed    OT Diagnosis: Generalized weakness  OT  Problem List: Decreased strength;Decreased knowledge of use of DME or AE;Decreased activity tolerance OT Treatment Interventions: Self-care/ADL training;Therapeutic activities;DME and/or AE instruction;Patient/family education   OT Goals Acute Rehab OT Goals OT Goal Formulation: With patient Time For Goal Achievement: 08/08/11 Potential to Achieve Goals: Good ADL Goals Pt Will Perform Grooming: Other (comment);Standing at sink;Independently (3 tasks) ADL Goal: Grooming - Progress: Goal set today Pt Will Transfer to Toilet: with modified independence;Comfort height toilet;Other (comment);Ambulation (with vanity versus 3in1) ADL Goal: Toilet Transfer - Progress: Goal set today Pt Will Perform Toileting - Clothing Manipulation: with modified independence;Standing ADL Goal: Toileting - Clothing Manipulation - Progress: Goal set today Pt Will Perform Tub/Shower Transfer: with supervision;Tub transfer;Other (comment) (cut out tub with small ledge) ADL Goal: Tub/Shower Transfer - Progress: Goal set today Additional ADL Goal #1: Pt will gather all clothing items for dressing task with independence.  ADL Goal: Additional Goal #1 - Progress: Goal set today  Visit Information  Last OT Received On: 07/25/11 Assistance Needed: +1    Subjective Data  Subjective: yes, I walked with Thomasene Mohair earlier Patient Stated Goal: to go home when able   Prior Functioning  Home Living Lives With: Spouse Type of Home: House Home Access: Stairs to enter [employer] of Steps: 4 Entrance Stairs-Rails: Can reach both Home Layout: One level Bathroom Shower/Tub: Tub/shower unit;Other (comment) (with cut out door. small ledge to step in. built in seat) Bathroom Toilet: Handicapped height (vanity beside) Home Adaptive Equipment: None;Built-in shower seat Prior Function Level of Independence: Independent;Needs assistance Needs Assistance: Meal Prep;Light Housekeeping Meal Prep: Moderate Light  Housekeeping: Moderate Able to Take Stairs?: Yes Driving: No Vocation: Retired Comments: pt states spouse has been helping with housework since she has been sick more recently Communication Communication: No difficulties    Cognition  Overall Cognitive  Status: Appears within functional limits for tasks assessed/performed Arousal/Alertness: Awake/alert Orientation Level: Appears intact for tasks assessed Behavior During Session: [facility] for tasks performed    Extremity/Trunk Assessment Right Upper Extremity Assessment RUE ROM/Strength/Tone: Within functional levels Left Upper Extremity Assessment LUE ROM/Strength/Tone: Within functional levels Right Lower Extremity Assessment RLE ROM/Strength/Tone: [facility] for tasks assessed Left Lower Extremity Assessment LLE ROM/Strength/Tone: WFL for tasks assessed   Mobility Transfers Transfers: Sit to Stand;Stand to Sit Sit to Stand: 5: Supervision;With upper extremity assist;From chair/3-in-1 Stand to Sit: 5: Supervision;With upper extremity assist;To chair/3-in-1   Exercise    Balance    End of Session OT - End of Session Activity Tolerance: Patient tolerated treatment well Patient left: in chair;with call bell/phone within reach   Lennox Laity [phone] 07/25/2011, 10:50 AM

## 2011-07-26 DIAGNOSIS — I509 Heart failure, unspecified: Secondary | ICD-10-CM

## 2011-07-26 DIAGNOSIS — I70209 Unspecified atherosclerosis of native arteries of extremities, unspecified extremity: Secondary | ICD-10-CM

## 2011-07-26 DIAGNOSIS — R627 Adult failure to thrive: Secondary | ICD-10-CM

## 2011-07-26 DIAGNOSIS — J438 Other emphysema: Secondary | ICD-10-CM

## 2011-07-26 LAB — CULTURE, BLOOD (ROUTINE X 2)
Culture  Setup Time: 201305302131
Culture: NO GROWTH

## 2011-07-26 LAB — CBC
Hemoglobin: 11.9 g/dL — ABNORMAL LOW (ref 12.0–15.0)
MCHC: 30.7 g/dL (ref 30.0–36.0)
RDW: 15.3 % (ref 11.5–15.5)
WBC: 15.3 10*3/uL — ABNORMAL HIGH (ref 4.0–10.5)

## 2011-07-26 MED ORDER — ALBUTEROL SULFATE HFA 108 (90 BASE) MCG/ACT IN AERS: 2.0000 | INHALATION_SPRAY | Freq: Four times a day (QID) | RESPIRATORY_TRACT | Status: DC | PRN

## 2011-07-26 MED ORDER — AMIODARONE HCL 200 MG PO TABS: 200.0000 mg | ORAL_TABLET | Freq: Two times a day (BID) | ORAL | Status: DC

## 2011-07-26 MED ORDER — FUROSEMIDE 20 MG PO TABS: 40.0000 mg | ORAL_TABLET | Freq: Every day | ORAL | Status: DC

## 2011-07-26 MED ORDER — POTASSIUM CHLORIDE CRYS ER 20 MEQ PO TBCR: 20.0000 meq | EXTENDED_RELEASE_TABLET | Freq: Every day | ORAL | Status: DC

## 2011-07-26 MED ORDER — DILTIAZEM HCL 30 MG PO TABS: 30.0000 mg | ORAL_TABLET | Freq: Three times a day (TID) | ORAL | Status: DC

## 2011-07-26 NOTE — Progress Notes (Signed)
SATURATION QUALIFICATIONS:  Patient Saturations on Room Air at Rest = 92%  Patient Saturations on Room Air while Ambulating = 87%  Patient Saturations on 2 Liters of oxygen while Ambulating = 95%

## 2011-07-26 NOTE — Progress Notes (Addendum)
Occupational Therapy Treatment Patient Details Name: [name] MRN: [id number] DOB: 05/09/1929 Today's Date: 07/26/2011 Time: 6962-9528 OT Time Calculation (min): 22 min  OT Assessment / Plan / Recommendation Comments on Treatment Session Pt doing well. Supposed to discharge home today. Husband and daughter can provide PRN supervision/assist.     Follow Up Recommendations  No OT follow up    Barriers to Discharge       Equipment Recommendations  3 in 1 bedside comode    Recommendations for Other Services    Frequency Min 2X/week   Plan Discharge plan remains appropriate    Precautions / Restrictions Precautions Precautions: Fall Restrictions Weight Bearing Restrictions: No        ADL  Grooming: Performed;Wash/dry hands;Supervision/safety Where Assessed - Grooming: Unsupported standing Toilet Transfer: Performed;Supervision/safety to manage O2 tubing as pt not used to being on O2 Toilet Transfer Method: Other (comment) (ambulating) Acupuncturist: Materials engineer and Hygiene: Simulated;Supervision/safety Where Assessed - Engineer, mining and Hygiene: Standing Tub/Shower Transfer: Simulated;Supervision/safety;Other (comment) (with holding to wall/grab bar) ADL Comments: supervision to gather clothing with no assistive device. Had pt to reach into bottom drawer and back into closet to simulate retrieving clothing off hangers and pt did well with no LOB.     OT Diagnosis: Generalized weakness  OT Problem List: Decreased knowledge of use of DME or AE;Decreased strength OT Treatment Interventions:     OT Goals ADL Goals ADL Goal: Grooming - Progress: Progressing toward goals ADL Goal: Toilet Transfer - Progress: Progressing toward goals ADL Goal: Toileting - Clothing Manipulation - Progress: Progressing toward goals ADL Goal: Tub/Shower Transfer - Progress: Met ADL Goal: Additional Goal #1 - Progress:  Progressing toward goals  Visit Information  Last OT Received On: 07/26/11 Assistance Needed: +1    Subjective Data  Subjective: what are we going to do today? Patient Stated Goal: home today   Prior Functioning       Cognition  Overall Cognitive Status: Appears within functional limits for tasks assessed/performed Arousal/Alertness: Awake/alert Orientation Level: Appears intact for tasks assessed Behavior During Session: [facility] for tasks performed    Mobility Bed Mobility Bed Mobility: Supine to Sit Supine to Sit: 5: Supervision;HOB elevated Transfers Transfers: Sit to Stand;Stand to Sit Sit to Stand: 6: Modified independent (Device/Increase time);With upper extremity assist;From chair/3-in-1;From bed Stand to Sit: 6: Modified independent (Device/Increase time);With upper extremity assist;To bed;To chair/3-in-1   Exercises    Balance Balance Balance Assessed: Yes Dynamic Sitting Balance Dynamic Sitting - Balance Support: During functional activity Dynamic Sitting - Level of Assistance: 5: Stand by assistance  End of Session OT - End of Session Activity Tolerance: Patient tolerated treatment well Patient left: in bed;with call bell/phone within reach   Lennox Laity [phone] 07/26/2011, 10:39 AM

## 2011-07-26 NOTE — Discharge Instructions (Signed)
Follow with Primary MD [name], MD, MD in 3-4 days, and your Heart Doctor in 1 week.   Get CBC, CMP, checked 3-4 days by Primary MD and again as instructed by your Primary MD. Get a 2 view Chest X ray done next visit.  Get Medicines reviewed and adjusted.  Please request your Prim.MD to go over all Hospital Tests and Procedure/Radiological results at the follow up, please get all Hospital records sent to your Prim MD by signing hospital release before you go home.  Activity: As tolerated with Full fall precautions use walker/cane & assistance as needed  Diet: Heart Healthy,  Fluid restriction 1.8 lit/day, Aspiration precautions.  Check your Weight same time everyday, if you gain over 2 pounds, or you develop in leg swelling, experience more shortness of breath or chest pain, call your Primary MD immediately. Follow Cardiac Low Salt Diet and 1.8 lit/day fluid restriction.  Disposition Home  If you experience worsening of your admission symptoms, develop shortness of breath, life threatening emergency, suicidal or homicidal thoughts you must seek medical attention immediately by calling 911 or calling your MD immediately  if symptoms less severe.  You Must read complete instructions/literature along with all the possible adverse reactions/side effects for all the Medicines you take and that have been prescribed to you. Take any new Medicines after you have completely understood and accpet all the possible adverse reactions/side effects.   Do not drive if your were admitted for syncope or siezures until you have seen by Primary MD or a Neurologist and advised to drive.  Do not drive when taking Pain medications.    Do not take more than prescribed Pain, Sleep and Anxiety Medications  Special Instructions: If you have smoked or chewed Tobacco  in the last 2 yrs please stop smoking, stop any regular Alcohol  and or any Recreational drug use.  Wear Seat belts while driving.
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
## 2011-08-21 ENCOUNTER — Other Ambulatory Visit: Payer: Self-pay | Admitting: *Deleted

## 2011-08-21 ENCOUNTER — Encounter: Payer: Self-pay | Admitting: Vascular Surgery

## 2011-08-21 DIAGNOSIS — I70219 Atherosclerosis of native arteries of extremities with intermittent claudication, unspecified extremity: Secondary | ICD-10-CM

## 2011-08-22 ENCOUNTER — Ambulatory Visit (INDEPENDENT_AMBULATORY_CARE_PROVIDER_SITE_OTHER): Payer: Medicare Other | Admitting: Vascular Surgery

## 2011-08-22 ENCOUNTER — Encounter: Payer: Self-pay | Admitting: Vascular Surgery

## 2011-08-22 VITALS — BP 104/73 | HR 74 | Temp 97.9°F | Ht 65.0 in | Wt 97.0 lb

## 2011-08-22 DIAGNOSIS — I70219 Atherosclerosis of native arteries of extremities with intermittent claudication, unspecified extremity: Secondary | ICD-10-CM

## 2011-08-22 NOTE — Progress Notes (Signed)
Bilateral ABI's performed @ [facility] 08/22/2011
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
## 2011-09-22 ENCOUNTER — Encounter (HOSPITAL_COMMUNITY): Payer: Self-pay | Admitting: Emergency Medicine

## 2011-09-22 ENCOUNTER — Inpatient Hospital Stay (HOSPITAL_COMMUNITY)
Admission: EM | Admit: 2011-09-22 | Discharge: 2011-09-29 | DRG: 469 | Disposition: A | Discharge status: Skilled Nursing Facility | Payer: Medicare Other | Attending: Internal Medicine | Admitting: Internal Medicine

## 2011-09-22 ENCOUNTER — Emergency Department (HOSPITAL_COMMUNITY): Payer: Medicare Other

## 2011-09-22 DIAGNOSIS — J449 Chronic obstructive pulmonary disease, unspecified: Secondary | ICD-10-CM | POA: Diagnosis present

## 2011-09-22 DIAGNOSIS — I5023 Acute on chronic systolic (congestive) heart failure: Secondary | ICD-10-CM | POA: Diagnosis present

## 2011-09-22 DIAGNOSIS — S72009A Fracture of unspecified part of neck of unspecified femur, initial encounter for closed fracture: Secondary | ICD-10-CM

## 2011-09-22 DIAGNOSIS — N182 Chronic kidney disease, stage 2 (mild): Secondary | ICD-10-CM | POA: Diagnosis present

## 2011-09-22 DIAGNOSIS — Z01810 Encounter for preprocedural cardiovascular examination: Secondary | ICD-10-CM

## 2011-09-22 DIAGNOSIS — J4489 Other specified chronic obstructive pulmonary disease: Secondary | ICD-10-CM | POA: Diagnosis present

## 2011-09-22 DIAGNOSIS — M629 Disorder of muscle, unspecified: Secondary | ICD-10-CM

## 2011-09-22 DIAGNOSIS — W010XXA Fall on same level from slipping, tripping and stumbling without subsequent striking against object, initial encounter: Secondary | ICD-10-CM | POA: Diagnosis present

## 2011-09-22 DIAGNOSIS — Z853 Personal history of malignant neoplasm of breast: Secondary | ICD-10-CM

## 2011-09-22 DIAGNOSIS — N390 Urinary tract infection, site not specified: Secondary | ICD-10-CM

## 2011-09-22 DIAGNOSIS — J90 Pleural effusion, not elsewhere classified: Secondary | ICD-10-CM | POA: Diagnosis present

## 2011-09-22 DIAGNOSIS — I4891 Unspecified atrial fibrillation: Secondary | ICD-10-CM | POA: Diagnosis present

## 2011-09-22 DIAGNOSIS — E43 Unspecified severe protein-calorie malnutrition: Secondary | ICD-10-CM

## 2011-09-22 DIAGNOSIS — D62 Acute posthemorrhagic anemia: Secondary | ICD-10-CM | POA: Diagnosis not present

## 2011-09-22 DIAGNOSIS — IMO0002 Reserved for concepts with insufficient information to code with codable children: Secondary | ICD-10-CM

## 2011-09-22 DIAGNOSIS — I129 Hypertensive chronic kidney disease with stage 1 through stage 4 chronic kidney disease, or unspecified chronic kidney disease: Secondary | ICD-10-CM | POA: Diagnosis present

## 2011-09-22 DIAGNOSIS — R000 Tachycardia, unspecified: Secondary | ICD-10-CM

## 2011-09-22 DIAGNOSIS — I999 Unspecified disorder of circulatory system: Secondary | ICD-10-CM

## 2011-09-22 DIAGNOSIS — I499 Cardiac arrhythmia, unspecified: Secondary | ICD-10-CM

## 2011-09-22 DIAGNOSIS — Y92009 Unspecified place in unspecified non-institutional (private) residence as the place of occurrence of the external cause: Secondary | ICD-10-CM

## 2011-09-22 DIAGNOSIS — R627 Adult failure to thrive: Secondary | ICD-10-CM | POA: Diagnosis present

## 2011-09-22 DIAGNOSIS — I70219 Atherosclerosis of native arteries of extremities with intermittent claudication, unspecified extremity: Secondary | ICD-10-CM

## 2011-09-22 DIAGNOSIS — S72033A Displaced midcervical fracture of unspecified femur, initial encounter for closed fracture: Principal | ICD-10-CM | POA: Diagnosis present

## 2011-09-22 DIAGNOSIS — D638 Anemia in other chronic diseases classified elsewhere: Secondary | ICD-10-CM

## 2011-09-22 DIAGNOSIS — I509 Heart failure, unspecified: Secondary | ICD-10-CM | POA: Diagnosis present

## 2011-09-22 DIAGNOSIS — I428 Other cardiomyopathies: Secondary | ICD-10-CM | POA: Diagnosis present

## 2011-09-22 DIAGNOSIS — J9601 Acute respiratory failure with hypoxia: Secondary | ICD-10-CM

## 2011-09-22 DIAGNOSIS — D72829 Elevated white blood cell count, unspecified: Secondary | ICD-10-CM | POA: Diagnosis present

## 2011-09-22 DIAGNOSIS — Z681 Body mass index (BMI) 19 or less, adult: Secondary | ICD-10-CM

## 2011-09-22 DIAGNOSIS — I10 Essential (primary) hypertension: Secondary | ICD-10-CM

## 2011-09-22 DIAGNOSIS — D631 Anemia in chronic kidney disease: Secondary | ICD-10-CM | POA: Diagnosis present

## 2011-09-22 DIAGNOSIS — I771 Stricture of artery: Secondary | ICD-10-CM

## 2011-09-22 LAB — CBC WITH DIFFERENTIAL/PLATELET
HCT: 33.9 % — ABNORMAL LOW (ref 36.0–46.0)
Hemoglobin: 10.8 g/dL — ABNORMAL LOW (ref 12.0–15.0)
Lymphs Abs: 0.6 10*3/uL — ABNORMAL LOW (ref 0.7–4.0)
MCH: 25.2 pg — ABNORMAL LOW (ref 26.0–34.0)
MCHC: 31.9 g/dL (ref 30.0–36.0)
Monocytes Absolute: 0.8 10*3/uL (ref 0.1–1.0)
Monocytes Relative: 4 % (ref 3–12)
Neutro Abs: 18.6 10*3/uL — ABNORMAL HIGH (ref 1.7–7.7)
Neutrophils Relative %: 93 % — ABNORMAL HIGH (ref 43–77)
RBC: 4.28 MIL/uL (ref 3.87–5.11)

## 2011-09-22 LAB — URINE MICROSCOPIC-ADD ON

## 2011-09-22 LAB — COMPREHENSIVE METABOLIC PANEL
ALT: 18 U/L (ref 0–35)
AST: 22 U/L (ref 0–37)
Albumin: 2.8 g/dL — ABNORMAL LOW (ref 3.5–5.2)
Alkaline Phosphatase: 88 U/L (ref 39–117)
Chloride: 98 mEq/L (ref 96–112)
Potassium: 3.8 mEq/L (ref 3.5–5.1)
Sodium: 139 mEq/L (ref 135–145)
Total Bilirubin: 0.4 mg/dL (ref 0.3–1.2)
Total Protein: 6.8 g/dL (ref 6.0–8.3)

## 2011-09-22 LAB — URINALYSIS, ROUTINE W REFLEX MICROSCOPIC
Hgb urine dipstick: NEGATIVE
Nitrite: NEGATIVE
Protein, ur: NEGATIVE mg/dL
Specific Gravity, Urine: 1.02 (ref 1.005–1.030)
Urobilinogen, UA: 0.2 mg/dL (ref 0.0–1.0)

## 2011-09-22 LAB — TYPE AND SCREEN
ABO/RH(D): A POS
Antibody Screen: NEGATIVE

## 2011-09-22 LAB — PHOSPHORUS: Phosphorus: 4.3 mg/dL (ref 2.3–4.6)

## 2011-09-22 LAB — ABO/RH: ABO/RH(D): A POS

## 2011-09-22 MED ORDER — DEXTROSE 5 % IV SOLN
500.0000 mg | INTRAVENOUS | Status: DC
  Administered 2011-09-23: 500 mg via INTRAVENOUS
  Filled 2011-09-22 (×2): qty 500

## 2011-09-22 MED ORDER — METOPROLOL SUCCINATE ER 50 MG PO TB24
50.0000 mg | ORAL_TABLET | Freq: Every day | ORAL | Status: DC
  Administered 2011-09-23 – 2011-09-29 (×6): 50 mg via ORAL
  Filled 2011-09-22 (×7): qty 1

## 2011-09-22 MED ORDER — DOCUSATE SODIUM 100 MG PO CAPS
100.0000 mg | ORAL_CAPSULE | Freq: Two times a day (BID) | ORAL | Status: DC
Start: 2011-09-22 — End: 2011-09-29
  Administered 2011-09-23 – 2011-09-29 (×13): 100 mg via ORAL
  Filled 2011-09-22 (×15): qty 1

## 2011-09-22 MED ORDER — ONDANSETRON HCL 4 MG/2ML IJ SOLN
4.0000 mg | Freq: Four times a day (QID) | INTRAMUSCULAR | Status: DC | PRN
  Filled 2011-09-22: qty 2

## 2011-09-22 MED ORDER — ALBUTEROL SULFATE HFA 108 (90 BASE) MCG/ACT IN AERS: 2.0000 | INHALATION_SPRAY | Freq: Four times a day (QID) | RESPIRATORY_TRACT | Status: DC | PRN

## 2011-09-22 MED ORDER — OXYCODONE-ACETAMINOPHEN 5-325 MG PO TABS
1.0000 | ORAL_TABLET | Freq: Once | ORAL | Status: AC
  Administered 2011-09-22: 1 via ORAL
  Filled 2011-09-22: qty 1

## 2011-09-22 MED ORDER — DILTIAZEM HCL 30 MG PO TABS
30.0000 mg | ORAL_TABLET | Freq: Three times a day (TID) | ORAL | Status: DC
  Administered 2011-09-23: 30 mg via ORAL
  Filled 2011-09-22 (×5): qty 1

## 2011-09-22 MED ORDER — ASPIRIN EC 325 MG PO TBEC
325.0000 mg | DELAYED_RELEASE_TABLET | Freq: Every day | ORAL | Status: DC
  Filled 2011-09-22: qty 1

## 2011-09-22 MED ORDER — SODIUM CHLORIDE 0.9 % IV SOLN: INTRAVENOUS | Status: DC

## 2011-09-22 MED ORDER — HYDROMORPHONE HCL PF 1 MG/ML IJ SOLN
INTRAMUSCULAR | Status: AC
  Administered 2011-09-22: 1 mg
  Filled 2011-09-22: qty 1

## 2011-09-22 MED ORDER — MAGNESIUM CITRATE PO SOLN: 1.0000 | Freq: Once | ORAL | Status: AC | PRN

## 2011-09-22 MED ORDER — DEXTROSE 5 % IV SOLN
1.0000 g | INTRAVENOUS | Status: DC
  Administered 2011-09-23: 1 g via INTRAVENOUS
  Filled 2011-09-22 (×2): qty 10

## 2011-09-22 MED ORDER — POTASSIUM CHLORIDE CRYS ER 20 MEQ PO TBCR
20.0000 meq | EXTENDED_RELEASE_TABLET | Freq: Every day | ORAL | Status: DC
  Administered 2011-09-22: 20 meq via ORAL
  Filled 2011-09-22 (×2): qty 1

## 2011-09-22 MED ORDER — FUROSEMIDE 40 MG PO TABS
40.0000 mg | ORAL_TABLET | Freq: Every day | ORAL | Status: DC
  Administered 2011-09-23 – 2011-09-29 (×7): 40 mg via ORAL
  Filled 2011-09-22 (×7): qty 1

## 2011-09-22 MED ORDER — MORPHINE SULFATE 2 MG/ML IJ SOLN
1.0000 mg | INTRAMUSCULAR | Status: DC | PRN
  Filled 2011-09-22: qty 1

## 2011-09-22 MED ORDER — PANTOPRAZOLE SODIUM 40 MG PO TBEC
40.0000 mg | DELAYED_RELEASE_TABLET | Freq: Every day | ORAL | Status: DC
  Administered 2011-09-23 – 2011-09-28 (×6): 40 mg via ORAL
  Filled 2011-09-22 (×7): qty 1

## 2011-09-22 MED ORDER — MORPHINE SULFATE 2 MG/ML IJ SOLN
0.5000 mg | INTRAMUSCULAR | Status: DC | PRN
  Administered 2011-09-26: 0.5 mg via INTRAVENOUS
  Filled 2011-09-22: qty 1

## 2011-09-22 MED ORDER — ATORVASTATIN CALCIUM 40 MG PO TABS
40.0000 mg | ORAL_TABLET | Freq: Every day | ORAL | Status: DC
  Administered 2011-09-23 – 2011-09-28 (×5): 40 mg via ORAL
  Filled 2011-09-22 (×7): qty 1

## 2011-09-22 MED ORDER — ONDANSETRON HCL 4 MG PO TABS
4.0000 mg | ORAL_TABLET | Freq: Four times a day (QID) | ORAL | Status: DC | PRN
  Administered 2011-09-22: 4 mg via ORAL

## 2011-09-22 MED ORDER — HYDROMORPHONE HCL PF 1 MG/ML IJ SOLN
1.0000 mg | Freq: Once | INTRAMUSCULAR | Status: AC
  Administered 2011-09-22: 1 mg via INTRAVENOUS

## 2011-09-22 MED ORDER — POLYETHYLENE GLYCOL 3350 17 G PO PACK: 17.0000 g | PACK | Freq: Every day | ORAL | Status: DC | PRN

## 2011-09-22 MED ORDER — METHOCARBAMOL 500 MG PO TABS
500.0000 mg | ORAL_TABLET | Freq: Four times a day (QID) | ORAL | Status: DC | PRN
  Administered 2011-09-23 – 2011-09-26 (×6): 500 mg via ORAL
  Filled 2011-09-22 (×6): qty 1

## 2011-09-22 MED ORDER — SORBITOL 70 % SOLN
30.0000 mL | Freq: Every day | Status: DC | PRN
  Filled 2011-09-22: qty 30

## 2011-09-22 MED ORDER — ENOXAPARIN SODIUM 30 MG/0.3ML ~~LOC~~ SOLN
30.0000 mg | SUBCUTANEOUS | Status: DC
  Administered 2011-09-22 – 2011-09-23 (×2): 30 mg via SUBCUTANEOUS
  Filled 2011-09-22 (×4): qty 0.3

## 2011-09-22 MED ORDER — SODIUM CHLORIDE 0.9 % IJ SOLN
3.0000 mL | Freq: Two times a day (BID) | INTRAMUSCULAR | Status: DC
  Administered 2011-09-23 – 2011-09-28 (×9): 3 mL via INTRAVENOUS

## 2011-09-22 MED ORDER — LORATADINE 10 MG PO TABS
10.0000 mg | ORAL_TABLET | Freq: Every day | ORAL | Status: DC
  Administered 2011-09-23 – 2011-09-29 (×7): 10 mg via ORAL
  Filled 2011-09-22 (×7): qty 1

## 2011-09-22 MED ORDER — AMIODARONE HCL 200 MG PO TABS
200.0000 mg | ORAL_TABLET | Freq: Two times a day (BID) | ORAL | Status: DC
  Administered 2011-09-23: 200 mg via ORAL
  Filled 2011-09-22 (×4): qty 1

## 2011-09-22 MED ORDER — MORPHINE SULFATE 2 MG/ML IJ SOLN: 0.5000 mg | INTRAMUSCULAR | Status: DC | PRN

## 2011-09-22 MED ORDER — HYDROMORPHONE HCL PF 1 MG/ML IJ SOLN
0.5000 mg | Freq: Once | INTRAMUSCULAR | Status: DC
  Filled 2011-09-22: qty 1

## 2011-09-22 MED ORDER — HYDROCODONE-ACETAMINOPHEN 5-325 MG PO TABS
1.0000 | ORAL_TABLET | Freq: Four times a day (QID) | ORAL | Status: DC | PRN
  Administered 2011-09-23: 2 via ORAL
  Administered 2011-09-24: 1 via ORAL
  Filled 2011-09-22 (×4): qty 2

## 2011-09-22 MED ORDER — FERROUS SULFATE 325 (65 FE) MG PO TABS
325.0000 mg | ORAL_TABLET | Freq: Every day | ORAL | Status: DC
  Administered 2011-09-24 – 2011-09-28 (×5): 325 mg via ORAL
  Filled 2011-09-22 (×10): qty 1

## 2011-09-22 MED ORDER — METHOCARBAMOL 100 MG/ML IJ SOLN
500.0000 mg | Freq: Four times a day (QID) | INTRAMUSCULAR | Status: DC | PRN
  Filled 2011-09-22: qty 5

## 2011-09-22 MED ORDER — HYDROCODONE-ACETAMINOPHEN 5-325 MG PO TABS
1.0000 | ORAL_TABLET | Freq: Four times a day (QID) | ORAL | Status: DC | PRN
  Administered 2011-09-23 (×2): 1 via ORAL
  Administered 2011-09-24: 2 via ORAL
  Administered 2011-09-24: 1 via ORAL
  Administered 2011-09-24 (×2): 2 via ORAL
  Administered 2011-09-25: 1 via ORAL
  Administered 2011-09-25 – 2011-09-26 (×3): 2 via ORAL
  Administered 2011-09-27 (×3): 1 via ORAL
  Administered 2011-09-28 – 2011-09-29 (×2): 2 via ORAL
  Administered 2011-09-29: 1 via ORAL
  Filled 2011-09-22 (×3): qty 1
  Filled 2011-09-22: qty 2
  Filled 2011-09-22 (×3): qty 1
  Filled 2011-09-22 (×2): qty 2
  Filled 2011-09-22: qty 1
  Filled 2011-09-22 (×4): qty 2

## 2011-09-22 NOTE — ED Notes (Signed)
Attempted to call report. Nurse not available to take report.

## 2011-09-22 NOTE — ED Notes (Signed)
Attempted to call report.  Nurse unavailable. 

## 2011-09-22 NOTE — ED Notes (Signed)
Lab at bedside collecting specimen

## 2011-09-22 NOTE — ED Notes (Signed)
Per Dr. Rosalia Hammers ok to reschedule TSH lab to be drawn with 5am labs.  RN aware.
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
## 2011-09-22 NOTE — ED Notes (Signed)
Per EMS, pt was walking to the BR and fell this am.  Pt reports L hip pain, with external rotation per EMS.  Fentanyl and zofran 4mg  IVP given en route.
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
## 2011-09-22 NOTE — ED Notes (Signed)
ZOX:[id number]<AV> Expected date:09/22/11<BR> Expected time: 5:05 PM<BR> Means of arrival:Ambulance<BR> Comments:<BR> Elderly, L hip pain with external rotation

## 2011-09-23 ENCOUNTER — Encounter (HOSPITAL_COMMUNITY): Payer: Self-pay | Admitting: Anesthesiology

## 2011-09-23 ENCOUNTER — Encounter (HOSPITAL_COMMUNITY): Payer: Self-pay | Admitting: Cardiology

## 2011-09-23 ENCOUNTER — Inpatient Hospital Stay (HOSPITAL_COMMUNITY): Payer: Medicare Other | Admitting: Anesthesiology

## 2011-09-23 DIAGNOSIS — I70219 Atherosclerosis of native arteries of extremities with intermittent claudication, unspecified extremity: Secondary | ICD-10-CM

## 2011-09-23 DIAGNOSIS — I5023 Acute on chronic systolic (congestive) heart failure: Secondary | ICD-10-CM

## 2011-09-23 DIAGNOSIS — I4891 Unspecified atrial fibrillation: Secondary | ICD-10-CM

## 2011-09-23 DIAGNOSIS — J9600 Acute respiratory failure, unspecified whether with hypoxia or hypercapnia: Secondary | ICD-10-CM

## 2011-09-23 DIAGNOSIS — Z01810 Encounter for preprocedural cardiovascular examination: Secondary | ICD-10-CM

## 2011-09-23 DIAGNOSIS — I509 Heart failure, unspecified: Secondary | ICD-10-CM

## 2011-09-23 LAB — MRSA PCR SCREENING: MRSA by PCR: NEGATIVE

## 2011-09-23 LAB — CBC
Hemoglobin: 10.2 g/dL — ABNORMAL LOW (ref 12.0–15.0)
MCH: 25.6 pg — ABNORMAL LOW (ref 26.0–34.0)
Platelets: 362 10*3/uL (ref 150–400)
RBC: 3.98 MIL/uL (ref 3.87–5.11)

## 2011-09-23 LAB — COMPREHENSIVE METABOLIC PANEL
Albumin: 2.7 g/dL — ABNORMAL LOW (ref 3.5–5.2)
Alkaline Phosphatase: 87 U/L (ref 39–117)
BUN: 25 mg/dL — ABNORMAL HIGH (ref 6–23)
CO2: 28 mEq/L (ref 19–32)
Chloride: 100 mEq/L (ref 96–112)
Creatinine, Ser: 0.84 mg/dL (ref 0.50–1.10)
GFR calc non Af Amer: 64 mL/min — ABNORMAL LOW (ref >90)
Potassium: 3.3 mEq/L — ABNORMAL LOW (ref 3.5–5.1)
Total Bilirubin: 0.3 mg/dL (ref 0.3–1.2)

## 2011-09-23 LAB — URINE CULTURE: Culture: NO GROWTH

## 2011-09-23 LAB — PROTIME-INR
INR: 1.11 (ref 0.00–1.49)
Prothrombin Time: 14.5 seconds (ref 11.6–15.2)

## 2011-09-23 LAB — LACTATE DEHYDROGENASE: LDH: 189 U/L (ref 94–250)

## 2011-09-23 LAB — GLUCOSE, CAPILLARY: Glucose-Capillary: 89 mg/dL (ref 70–99)

## 2011-09-23 LAB — STREP PNEUMONIAE URINARY ANTIGEN: Strep Pneumo Urinary Antigen: NEGATIVE

## 2011-09-23 MED ORDER — CHLORHEXIDINE GLUCONATE 4 % EX LIQD
60.0000 mL | Freq: Once | CUTANEOUS | Status: AC
  Administered 2011-09-24: 4 via TOPICAL
  Filled 2011-09-23: qty 60

## 2011-09-23 MED ORDER — VANCOMYCIN HCL IN DEXTROSE 1-5 GM/200ML-% IV SOLN: 1000.0000 mg | INTRAVENOUS | Status: DC

## 2011-09-23 MED ORDER — ASPIRIN EC 325 MG PO TBEC
325.0000 mg | DELAYED_RELEASE_TABLET | Freq: Every day | ORAL | Status: DC
  Administered 2011-09-25 – 2011-09-29 (×5): 325 mg via ORAL
  Filled 2011-09-23 (×5): qty 1

## 2011-09-23 MED ORDER — DEXTROSE-NACL 5-0.45 % IV SOLN: INTRAVENOUS | Status: DC

## 2011-09-23 MED ORDER — AMIODARONE HCL 200 MG PO TABS
200.0000 mg | ORAL_TABLET | Freq: Every day | ORAL | Status: DC
  Administered 2011-09-24 – 2011-09-29 (×6): 200 mg via ORAL
  Filled 2011-09-23 (×6): qty 1

## 2011-09-23 MED ORDER — POTASSIUM CHLORIDE CRYS ER 20 MEQ PO TBCR
20.0000 meq | EXTENDED_RELEASE_TABLET | Freq: Every day | ORAL | Status: DC
  Administered 2011-09-27 – 2011-09-28 (×2): 20 meq via ORAL
  Filled 2011-09-23 (×3): qty 1

## 2011-09-23 MED ORDER — POTASSIUM CHLORIDE 10 MEQ/100ML IV SOLN
10.0000 meq | INTRAVENOUS | Status: AC
Start: 2011-09-23 — End: 2011-09-23
  Administered 2011-09-23 (×3): 10 meq via INTRAVENOUS
  Filled 2011-09-23 (×3): qty 100

## 2011-09-23 MED ORDER — DEXTROSE-NACL 5-0.45 % IV SOLN
INTRAVENOUS | Status: DC
  Administered 2011-09-23: 35 mL/h via INTRAVENOUS

## 2011-09-23 MED ORDER — POTASSIUM CHLORIDE CRYS ER 20 MEQ PO TBCR
40.0000 meq | EXTENDED_RELEASE_TABLET | Freq: Every day | ORAL | Status: AC
  Administered 2011-09-23 – 2011-09-25 (×3): 40 meq via ORAL
  Filled 2011-09-23 (×3): qty 2

## 2011-09-23 MED ORDER — LISINOPRIL 2.5 MG PO TABS
2.5000 mg | ORAL_TABLET | Freq: Every day | ORAL | Status: DC
  Filled 2011-09-23: qty 1

## 2011-09-23 NOTE — Progress Notes (Signed)
[facility]                                                                                Patient Demographics  [name], is a 78 y.o. female  ZOX:[id number]  WJX:[id number]  DOB - Jun 12, 1929  Admit date - 09/22/2011  Admitting Physician Alison Murray, MD  Outpatient Primary MD for the patient is [name], MD  LOS - 1   Chief Complaint  Patient presents with  . Fall        Assessment & Plan   1. Trip and  fall with left-sided hip - patient awaits echogram and cardiology to see in to the OR for open reduction internal fixation of her left. Regardless of the tests and procedures patient will be a moderate to high risk for adverse cardiopulmonary outcome during the perioperative period due to her age and underlying comorbidities.  Cardio-Pulm Risk stratification for surgery and recommendations to minimize the same:-  A.Cardio-Pulmonary Risk -  this patient is a moderate to high for adverse Cardio-Pulmonary  Outcome  from surgery, the risks and benefits were discussed and acceptable to patient and her daughter  Recommendations for optimizing Cardio-Pulmonary  Risk risk factors  1. Keep SBP<140, HR<85, use Lopressor 5mg  IV q4hrs PRN, or B.Blocker drip PRN. 2. Tight I&O goal to keep even. 3. Minimal sedation. 4. Good pulmunary toilet. 5. Scheduled/PRN Nebs ordered keep Pox>90% 6. Hb>8, transfuse as needed- Lasix 10mg  IV after each unit PRBC Transfused.   Will request Surgeon to please Order Lovenox/DVT prophylaxis of his/her choice when OK from Surgeon's standpoint post op.     2. Anemia of chronic disease no acute issues try to keep hemoglobin over 8.     3. Leukocytosis is likely reactionary-she is afebrile does not have cough and is not short of breath, stop antibiotics.    4. Moderate to large left-sided pleural effusion which is likely subacute to chronic - patient is completely compensated, she wears 2 L nasal cannula  oxygen at home and that will be continued, he is in no distress, discussed this with Dr. Molli Knock pulmonary critical care who does not recommend thoracentesis do to possibility is of complications, I concur with this.    5. History of heart failure likely chronic systolic CHF with last known EF of 35%-echo to evaluate heart function as requested by cardiology last night, patient appears clinically compensated despite pleural effusion, we'll continue home dose Lasix, gentle IV fluids as she is n.p.o. awaiting surgery, IV fluids to be stopped once she is eating. We'll skip her next 2 doses of aspirin for upcoming surgery. We'll place her on low-dose ACE inhibitor and monitor creatinine.    6. History of COPD currently compensated no wheezing, when necessary nebulizer continue 2 L nasal cannula oxygen which is her baseline.     7. History of PAD with recent stenting of her femoral arteries, aspirin prescription for the next 2 doses, outpatient risk factor modulation.     8. History of atrial fibrillation goal will be rate control, patient is on calcium channel blocker, beta blocker, amiodarone, we'll continue cardiology to follow. She is on full dose aspirin and no other  anti-coagulation at baseline.     Code Status: Full  Family Communication: Discussed with the patient, and [name] over the phone on 09/23/2011  Disposition Plan: SNF    Procedures   Echo, later L Hip ORIF    Consults  Cardiology   Antibiotics  Stopped all   Time Spent in minutes   35   DVT Prophylaxis  Lovenox    Leroy Sea M.D on 09/23/2011 at 8:58 AM  Between 7am to 7pm - Pager - [phone]  After 7pm go to [email or web address] - password [email or web address]  And look for the night coverage person covering for me after hours  [facility] Office  [phone]    Subjective:   [name] today has, No headache, No chest pain, No abdominal pain - No Nausea, No new weakness  tingling or numbness, No Cough - SOB. Does have pain around her left hip fracture site.  Objective:   Filed Vitals:   09/23/11 0053 09/23/11 0148 09/23/11 0529 09/23/11 0800  BP: 118/73 133/77 135/74   Pulse: 72 72 77   Temp:   98 F (36.7 C)   TempSrc:   Oral   Resp: 19 19 19 20   Height:   5\' 5"  (1.651 m)   Weight:   42.2 kg (93 lb 0.6 oz)   SpO2:   99% 98%    Wt Readings from Last 3 Encounters:  09/23/11 42.2 kg (93 lb 0.6 oz)  08/22/11 43.999 kg (97 lb)  07/26/11 41.051 kg (90 lb 8 oz)     Intake/Output Summary (Last 24 hours) at 09/23/11 0858 Last data filed at 09/23/11 0529  Gross per 24 hour  Intake      0 ml  Output    450 ml  Net   -450 ml    Exam Awake Alert, Oriented X 3, No new F.N deficits, Normal affect [place].AT,PERRAL Supple Neck,No JVD, No cervical lymphadenopathy appriciated.  Symmetrical Chest wall movement, Good air movement bilaterally, CTAB RRR,No Gallops,Rubs or new Murmurs, No Parasternal Heave +ve B.Sounds, Abd Soft, Non tender, No organomegaly appriciated, No rebound - guarding or rigidity. No Cyanosis, Clubbing or edema, No new Rash or bruise      Data Review   CBC  Lab 09/22/11 1852  WBC 20.1*  HGB 10.8*  HCT 33.9*  PLT 394  MCV 79.2  MCH 25.2*  MCHC 31.9  RDW 16.4*  LYMPHSABS 0.6*  MONOABS 0.8  EOSABS 0.0  BASOSABS 0.0  BANDABS --    Chemistries   Lab 09/23/11 0446 09/22/11 1852  NA 138 139  K 3.3* 3.8  CL 100 98  CO2 28 31  GLUCOSE 161* 105*  BUN 25* 25*  CREATININE 0.84 0.98  CALCIUM 9.1 9.1  MG -- 2.1  AST 20 22  ALT 19 18  ALKPHOS 87 88  BILITOT 0.3 0.4   ------------------------------------------------------------------------------------------------------------------ estimated creatinine clearance is 36.2 ml/min (by C-G formula based on Cr of 0.84). ------------------------------------------------------------------------------------------------------------------ No results found for this basename: HGBA1C:2  in the last 72 hours ------------------------------------------------------------------------------------------------------------------ No results found for this basename: CHOL:2,HDL:2,LDLCALC:2,TRIG:2,CHOLHDL:2,LDLDIRECT:2 in the last 72 hours ------------------------------------------------------------------------------------------------------------------ No results found for this basename: TSH,T4TOTAL,FREET3,T3FREE,THYROIDAB in the last 72 hours ------------------------------------------------------------------------------------------------------------------ No results found for this basename: VITAMINB12:2,FOLATE:2,FERRITIN:2,TIBC:2,IRON:2,RETICCTPCT:2 in the last 72 hours  Coagulation profile  Lab 09/22/11 1852  INR 1.19  PROTIME --    No results found for this basename: DDIMER:2 in the last 72 hours  Cardiac Enzymes No results found for this basename: CK:3,CKMB:3,TROPONINI:3,MYOGLOBIN:3 in  the last 168 hours ------------------------------------------------------------------------------------------------------------------ No components found with this basename: POCBNP:3  Micro Results No results found for this or any previous visit (from the past 240 hour(s)).  Radiology Reports Dg Chest 1 View  09/22/2011  *RADIOLOGY REPORT*  Clinical Data: 78 year old female with left hip fracture - preoperative respiratory examination.  CHEST - 1 VIEW  Comparison: 08/29/2011 and prior chest radiographs  Findings: A moderate to large left pleural effusion is again noted with increasing left lower lung atelectasis. COPD/emphysema is again identified with increasing pulmonary vascular congestion. Mild right basilar opacity is noted - suspect atelectasis. There is no evidence of pneumothorax. No acute bony abnormalities are present. Surgical clips in the right axillary region are again noted.  IMPRESSION: Unchanged moderate to large left pleural effusion with increasing left lower lung atelectasis.   Pulmonary vascular congestion.  Mild right basilar opacity - suspect atelectasis.  Original Report Authenticated By: Rosendo Gros, M.D.   Dg Hip Complete Left  09/22/2011  *RADIOLOGY REPORT*  Clinical Data: Fall.  LEFT HIP - COMPLETE 2+ VIEW  Comparison: None  Findings: The bones are diffusely osteopenic.  There is an acute subcapital femoral neck fracture involving the proximal left femur. There is mild superior and lateral displacement of the distal fracture fragments.  Metallic stents are noted within the iliac vessels.  IMPRESSION:  1.  Acute left-sided subcapital femoral neck fracture.  Original Report Authenticated By: Rosealee Albee, M.D.    Scheduled Meds:   . amiodarone  200 mg Oral BID  . aspirin  325 mg Oral Daily  . atorvastatin  40 mg Oral q1800  . diltiazem  30 mg Oral TID  . docusate sodium  100 mg Oral BID  . enoxaparin (LOVENOX) injection  30 mg Subcutaneous Q24H  . ferrous sulfate  325 mg Oral Q breakfast  . furosemide  40 mg Oral Daily  . HYDROmorphone      .  HYDROmorphone (DILAUDID) injection  1 mg Intravenous Once  . loratadine  10 mg Oral Daily  . metoprolol succinate  50 mg Oral Daily  . oxyCODONE-acetaminophen  1 tablet Oral Once  . pantoprazole  40 mg Oral Q1200  . potassium chloride  10 mEq Intravenous Q1 Hr x 3  . potassium chloride  20 mEq Oral Daily  . potassium chloride SA  40 mEq Oral Daily  . sodium chloride  3 mL Intravenous Q12H  . DISCONTD: azithromycin  500 mg Intravenous Q24H  . DISCONTD: cefTRIAXone (ROCEPHIN)  IV  1 g Intravenous Q24H  . DISCONTD:  HYDROmorphone (DILAUDID) injection  0.5 mg Intravenous Once  . DISCONTD: potassium chloride SA  20 mEq Oral Daily   Continuous Infusions:   . dextrose 5 % and 0.45% NaCl    . DISCONTD: sodium chloride    . DISCONTD: sodium chloride     PRN Meds:.albuterol, HYDROcodone-acetaminophen, HYDROcodone-acetaminophen, magnesium citrate, methocarbamol (ROBAXIN) IV, methocarbamol, morphine injection,  morphine injection, ondansetron (ZOFRAN) IV, ondansetron, polyethylene glycol, sorbitol, DISCONTD:  morphine injection

## 2011-09-23 NOTE — Progress Notes (Signed)
Echo Tech paged [phone] [phone])  Awaiting to call back.

## 2011-09-23 NOTE — Progress Notes (Signed)
Patient ID: [name], female   DOB: January 19, 1930, 78 y.o.   MRN: [id number] Discussion with the patient and her daughter today regarding surgery. Dr. Jens Som of cardiology has seen the patient and indicated she has moderately high risk but he felt as though there is nothing really that can be done to improve the risk.  I discussed this with [name] and her daughter discuss the benefits of surgery which include mobilization and ability to weight-bear immediately and inability to get back to her weightbearing status on the left lower extremity which would not be guaranteed with either fixation with pins or no treatment at all. Palliate pain relief is improved with the hemiarthroplasty. Unfortunately pending the hip would require at least 6 weeks of nonweightbearing so that she would be at high risk of having problems related to being at bedrest. She was a household ambulator prior to her fall so that I think we should strive to get her back to that level of function. After some discussion between this is Hilyer and her daughter she has elected to undergo a press-fit pore coated left hip unipolar hemiarthroplasty. Any use of cement unfortunately with increased risk of cardiac complications. She indicates she would prefer to wait till tomorrow morning for this surgery. I have contacted the operating room and we have scheduled her for 7:30 AM surgery for a left hip hemiarthroplasty for left hip fracture. There is and benefits of the procedure are discussed with this patient she understands and fully including risk of death. I will allow her then diet today as planned for surgery tomorrow morning and n.p.o. past midnight.

## 2011-09-23 NOTE — Progress Notes (Signed)
SLP Cancellation Note Order received for bedside swallow eval.  Pt is NPO pending surgery today.   Will f/u 8/4 if swallow eval is still deemed to be warranted.  Devery Odwyer L. Samson Frederic, [place] CCC/SLP Pager [phone]  Blenda Mounts Laurice 09/23/2011, 11:40 AM

## 2011-09-23 NOTE — Progress Notes (Signed)
Patient ID: [name], female   DOB: March 04, 1929, 78 y.o.   MRN: [id number] Awake alert oriented X 4 c/o left hip pain. Dr. Elisabeth Pigeon has requested a preop cardiology eval. Left leg is neurovascular normal. Left leg shortened by one inch. Will consent for left hip hemiarthroplasty and plan to schedule to do surgery today if Cleared by cardiology.
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
## 2011-09-24 ENCOUNTER — Inpatient Hospital Stay (HOSPITAL_COMMUNITY): Payer: Medicare Other

## 2011-09-24 ENCOUNTER — Encounter (HOSPITAL_COMMUNITY)
Admission: EM | Disposition: A | Discharge status: Skilled Nursing Facility | Payer: Self-pay | Source: Home / Self Care | Attending: Internal Medicine

## 2011-09-24 DIAGNOSIS — I10 Essential (primary) hypertension: Secondary | ICD-10-CM

## 2011-09-24 LAB — BASIC METABOLIC PANEL
BUN: 25 mg/dL — ABNORMAL HIGH (ref 6–23)
Calcium: 9.3 mg/dL (ref 8.4–10.5)
Chloride: 99 mEq/L (ref 96–112)
Creatinine, Ser: 1.12 mg/dL — ABNORMAL HIGH (ref 0.50–1.10)
GFR calc Af Amer: 53 mL/min — ABNORMAL LOW (ref >90)
GFR calc non Af Amer: 45 mL/min — ABNORMAL LOW (ref >90)

## 2011-09-24 LAB — BODY FLUID CELL COUNT WITH DIFFERENTIAL

## 2011-09-24 LAB — LACTATE DEHYDROGENASE, PLEURAL OR PERITONEAL FLUID: LD, Fluid: 89 U/L — ABNORMAL HIGH (ref 3–23)

## 2011-09-24 LAB — CBC
Hemoglobin: 10.7 g/dL — ABNORMAL LOW (ref 12.0–15.0)
MCH: 25.5 pg — ABNORMAL LOW (ref 26.0–34.0)
MCHC: 31.6 g/dL (ref 30.0–36.0)
RDW: 16.8 % — ABNORMAL HIGH (ref 11.5–15.5)

## 2011-09-24 LAB — PROTEIN, BODY FLUID: Total protein, fluid: 3.4 g/dL

## 2011-09-24 LAB — CHOLESTEROL, BODY FLUID: Cholesterol, Fluid: 50 mg/dL

## 2011-09-24 LAB — LEGIONELLA ANTIGEN, URINE

## 2011-09-24 SURGERY — HEMIARTHROPLASTY, HIP, DIRECT ANTERIOR APPROACH, FOR FRACTURE
Anesthesia: Spinal | Laterality: Left

## 2011-09-24 MED ORDER — LACTATED RINGERS IV SOLN
INTRAVENOUS | Status: AC | PRN
  Administered 2011-09-24: 07:00:00 via INTRAVENOUS

## 2011-09-24 MED ORDER — VANCOMYCIN HCL IN DEXTROSE 1-5 GM/200ML-% IV SOLN
1000.0000 mg | INTRAVENOUS | Status: AC
  Administered 2011-09-24: 1000 mg via INTRAVENOUS
  Filled 2011-09-24: qty 200

## 2011-09-24 MED ORDER — SODIUM CHLORIDE 0.9 % IV SOLN
INTRAVENOUS | Status: AC
  Administered 2011-09-24: 13:00:00 via INTRAVENOUS

## 2011-09-24 NOTE — Progress Notes (Signed)
Clinical Social Work Department BRIEF PSYCHOSOCIAL ASSESSMENT 09/24/2011  Patient:  [name], ARCOS     Account Number:  [id number]     Admit date:  09/22/2011  Clinical Social Worker:  Leron Croak, CLINICAL SOCIAL WORKER  Date/Time:  09/24/2011 06:03 PM  Referred by:  Physician  Date Referred:  09/24/2011 Referred for  SNF Placement   Other Referral:   Interview type:  Patient Other interview type:   Family also in the room    PSYCHOSOCIAL DATA Living Status:  HUSBAND Admitted from facility:   Level of care:   Primary support name:  Ayda Tancredi Primary support relationship to patient:  SPOUSE Degree of support available:   good    CURRENT CONCERNS Current Concerns  Post-Acute Placement   Other Concerns:    SOCIAL WORK ASSESSMENT / PLAN CSW met with family and Pt to discuss PT recommendation for rehab. Pt and family are agreeable to rehab in the [place] area. Pt and family would like 1. [facility] 2. [facility], and 3. [facility], however gave permission for information to be sent to all of [place].    Assessment/plan status:  Information/Referral to [facility] Other assessment/ plan:   Information/referral to community resources:   CSW provided family with a listing of SNF facilities in the area.    PATIENT'S/FAMILY'S RESPONSE TO PLAN OF CARE: Pt and family were appreciative for information and assistance with d/c planning.      Leron Croak, LCSWA [employer] [phone]

## 2011-09-24 NOTE — Anesthesia Preprocedure Evaluation (Addendum)
Anesthesia Evaluation  Patient identified by MRN, date of birth, ID band Patient awake  General Assessment Comment:Advanced yrs Increased CV risk DNR  Reviewed: Allergy & Precautions, H&P , NPO status , Patient's Chart, lab work & pertinent test results, reviewed documented beta blocker date and time   History of Anesthesia Complications (+) PONV  Airway Mallampati: II TM Distance: >3 FB Neck ROM: Full    Dental  (+) Teeth Intact and Dental Advisory Given   Pulmonary COPDformer smoker,  breath sounds clear to auscultation  + decreased breath sounds      Cardiovascular hypertension, Pt. on medications + Peripheral Vascular Disease + dysrhythmias Atrial Fibrillation Rhythm:Regular Rate:Normal  Hx A. Fib-converted Hx CHF w/ pleural effusion, s/p thorocentesis EF-35% ASPVD-iliac stents   Neuro/Psych negative neurological ROS  negative psych ROS   GI/Hepatic negative GI ROS, Neg liver ROS,   Endo/Other  negative endocrine ROS  Renal/GU negative Renal ROS  negative genitourinary   Musculoskeletal   Abdominal   Peds negative pediatric ROS (+)  Hematology negative hematology ROS (+) Anemia, Hgb 10.7   Anesthesia Other Findings   Reproductive/Obstetrics Hx breast cancer                          Anesthesia Physical Anesthesia Plan  ASA: III  Anesthesia Plan: Spinal   Post-op Pain Management:    Induction:   Airway Management Planned: Mask  Additional Equipment:   Intra-op Plan:   Post-operative Plan:   Informed Consent: I have reviewed the patients History and Physical, chart, labs and discussed the procedure including the risks, benefits and alternatives for the proposed anesthesia with the patient or authorized representative who has indicated his/her understanding and acceptance.     Plan Discussed with: CRNA and Surgeon  Anesthesia Plan Comments:         Anesthesia Quick  Evaluation

## 2011-09-24 NOTE — Progress Notes (Signed)
SLP Cancellation Note ST received order for BSE on 09/23/11.  RN confirms no noted difficulty with swallow.  ST to address on 09/25/11. Moreen Fowler MS, CCC-SLP [phone]  [facility] 09/24/2011, 9:52 AM

## 2011-09-24 NOTE — Progress Notes (Addendum)
[facility]                                                                                Patient Demographics  [name], is a 78 y.o. female  NGE:[id number]  [id number]:[id number]  DOB - Jul 03, 1929  Admit date - 09/22/2011  Admitting Physician Alison Murray, MD  Outpatient Primary MD for the patient is [name], MD  LOS - 2   Chief Complaint  Patient presents with  . Fall        Assessment & Plan   1. Trip and  fall with left-sided hip - patient awaits echogram and cardiology to see in to the OR for open reduction internal fixation of her left. Regardless of the tests and procedures patient will be a moderate to high risk for adverse cardiopulmonary outcome during the perioperative period due to her age and underlying comorbidities. Surgery will be held on 09/24/2011 due to one out of two positive blood cultures,  likely this represents a contamination however since patient is going to receive a prosthesis in contamination is confirmed surgery should be held. Have [facility] on 8--4-13 4pm cultures should be confirmed by 9am 09-25-11. Clinically likely contaminant.   Cardio-Pulm Risk stratification for surgery and recommendations to minimize the same:-  A.Cardio-Pulmonary Risk -  this patient is a moderate to high for adverse Cardio-Pulmonary  Outcome  from surgery, the risks and benefits were discussed and acceptable to patient and her daughter  Recommendations for optimizing Cardio-Pulmonary  Risk risk factors  1. Keep SBP<140, HR<85, use Lopressor 5mg  IV q4hrs PRN, or B.Blocker drip PRN. 2. Tight I&O goal to keep even. 3. Minimal sedation. 4. Good pulmunary toilet. 5. Scheduled/PRN Nebs ordered keep Pox>90% 6. Hb>8, transfuse as needed- Lasix 10mg  IV after each unit PRBC Transfused.   Will request Surgeon to please Order Lovenox/DVT prophylaxis of his/her choice when OK from Surgeon's standpoint post op.     2. Anemia of chronic  disease no acute issues try to keep hemoglobin over 8.     3. Leukocytosis is likely reactionary-she is afebrile does not have cough and is not short of breath, however continues to have a questionable opacity in the right middle lobe, due to pending surgery will place her on Levaquin, each to see the patient Will repeat chest x-ray again in one to 2 days.    4. Moderate to large left-sided pleural effusion which is likely subacute to chronic - in the light of one out of 2 positive blood cultures and leukocytosis we'll request IR to To culture under, repeat studies have been ordered for the fluid, however most likely appears to be a transudative fluid due to CHF. He should is clinically compensated and has no shortness of breath.    5. History of heart failure likely chronic systolic CHF with last known EF of 35%-echo to evaluate heart function as requested by cardiology last night, patient appears clinically compensated despite pleural effusion, we'll continue home dose Lasix, gentle IV fluids as she is n.p.o. awaiting surgery, IV fluids to be stopped once she is eating. We'll skip her next 2 doses of aspirin for upcoming surgery. ACE inhibitor  has been stopped as her creatinine bumped slightly after putting her on ACE, defer to her primary cardiologist to place her on ACE or aren't as outpatient.    6. History of COPD currently compensated no wheezing, when necessary nebulizer continue 2 L nasal cannula oxygen which is her baseline.     7. History of PAD with recent stenting of her femoral arteries, aspirin prescription for the next 2 doses, outpatient risk factor modulation.     8. History of atrial fibrillation goal will be rate control, patient is on calcium channel blocker, beta blocker, amiodarone, we'll continue cardiology to follow. She is on full dose aspirin and no other anti-coagulation at baseline.    9. Mild elevation of creatinine after placing her on ACE inhibitor, stop  ACE gentle IV fluids repeat BMP in the morning.    Code Status: Full  Family Communication: Discussed with the patient, and [name] over the phone on 09/23/2011 and in person on 09/24/2011  Disposition Plan: SNF    Procedures   Echo, later L Hip ORIF, left-sided thoracentesis by IR to be done 09/24/2011    Consults  Cardiology   Antibiotics  Stopped all   Time Spent in minutes   40   DVT Prophylaxis  Lovenox    Leroy Sea M.D on 09/24/2011 at 10:50 AM  Between 7am to 7pm - Pager - [phone]  After 7pm go to [email or web address] - password [email or web address]  And look for the night coverage person covering for me after hours  [facility] Office  [phone]    Subjective:   [name] today has, No headache, No chest pain, No abdominal pain - No Nausea, No new weakness tingling or numbness, No Cough - SOB. Does have pain around her left hip fracture site.  Objective:   Filed Vitals:   09/23/11 1550 09/23/11 1629 09/23/11 2130 09/24/11 0642  BP:  128/75 98/62 114/78  Pulse:  76 78 87  Temp:  98.5 F (36.9 C) 97.6 F (36.4 C) 98 F (36.7 C)  TempSrc:  Oral Oral Oral  Resp: 18  20 18   Height:      Weight:      SpO2: 98% 92% 97%     Wt Readings from Last 3 Encounters:  09/23/11 42.2 kg (93 lb 0.6 oz)  09/23/11 42.2 kg (93 lb 0.6 oz)  08/22/11 43.999 kg (97 lb)     Intake/Output Summary (Last 24 hours) at 09/24/11 1050 Last data filed at 09/24/11 0600  Gross per 24 hour  Intake    500 ml  Output   1150 ml  Net   -650 ml    Exam Awake Alert, Oriented X 3, No new F.N deficits, Normal affect [place].AT,PERRAL Supple Neck,No JVD, No cervical lymphadenopathy appriciated.  Symmetrical Chest wall movement, Good air movement bilaterally, CTAB RRR,No Gallops,Rubs or new Murmurs, No Parasternal Heave +ve B.Sounds, Abd Soft, Non tender, No organomegaly appriciated, No rebound - guarding or rigidity. No Cyanosis, Clubbing or edema, No new  Rash or bruise      Data Review   CBC  Lab 09/24/11 0515 09/23/11 0830 09/22/11 1852  WBC 19.0* 16.7* 20.1*  HGB 10.7* 10.2* 10.8*  HCT 33.9* 31.8* 33.9*  PLT 392 362 394  MCV 80.7 79.9 79.2  MCH 25.5* 25.6* 25.2*  MCHC 31.6 32.1 31.9  RDW 16.8* 16.3* 16.4*  LYMPHSABS -- -- 0.6*  MONOABS -- -- 0.8  EOSABS -- -- 0.0  BASOSABS -- -- 0.0  BANDABS -- -- --    Chemistries   Lab 09/24/11 0515 09/23/11 0446 09/22/11 1852  NA 137 138 139  K 4.2 3.3* 3.8  CL 99 100 98  CO2 32 28 31  GLUCOSE 99 161* 105*  BUN 25* 25* 25*  CREATININE 1.12* 0.84 0.98  CALCIUM 9.3 9.1 9.1  MG 2.1 -- 2.1  AST -- 20 22  ALT -- 19 18  ALKPHOS -- 87 88  BILITOT -- 0.3 0.4   ------------------------------------------------------------------------------------------------------------------ estimated creatinine clearance is 27.1 ml/min (by C-G formula based on Cr of 1.12). ------------------------------------------------------------------------------------------------------------------ No results found for this basename: HGBA1C:2 in the last 72 hours ------------------------------------------------------------------------------------------------------------------ No results found for this basename: CHOL:2,HDL:2,LDLCALC:2,TRIG:2,CHOLHDL:2,LDLDIRECT:2 in the last 72 hours ------------------------------------------------------------------------------------------------------------------  [facility] 09/23/11 0446  TSH 1.817  T4TOTAL --  T3FREE --  THYROIDAB --   ------------------------------------------------------------------------------------------------------------------ No results found for this basename: VITAMINB12:2,FOLATE:2,FERRITIN:2,TIBC:2,IRON:2,RETICCTPCT:2 in the last 72 hours  Coagulation profile  Lab 09/23/11 0940 09/22/11 1852  INR 1.11 1.19  PROTIME -- --    No results found for this basename: DDIMER:2 in the last 72 hours  Cardiac Enzymes No results found for this basename:  CK:3,CKMB:3,TROPONINI:3,MYOGLOBIN:3 in the last 168 hours ------------------------------------------------------------------------------------------------------------------ No components found with this basename: POCBNP:3  Micro Results Recent Results (from the past 240 hour(s))  URINE CULTURE     Status: Normal   Collection Time   09/22/11  6:33 PM      Component Value Range Status Comment   Specimen Description URINE, CATHETERIZED   Final    Special Requests NONE   Final    Culture  Setup Time 09/22/2011 23:16   Final    Colony Count NO GROWTH   Final    Culture NO GROWTH   Final    Report Status 09/23/2011 FINAL   Final   CULTURE, BLOOD (ROUTINE X 2)     Status: Normal (Preliminary result)   Collection Time   09/22/11 11:00 PM      Component Value Range Status Comment   Specimen Description BLOOD LEFT ARM   Final    Special Requests BOTTLES DRAWN AEROBIC AND ANAEROBIC   Final    Culture  Setup Time 09/23/2011 03:38   Final    Culture     Final    Value: GRAM POSITIVE COCCI IN CLUSTERS     Note: Gram Stain Report Called to,Read Back By and Verified With: Francesco Sor RN on 09/24/11 at 01:53 by Christie Nottingham   Report Status PENDING   Incomplete   CULTURE, BLOOD (ROUTINE X 2)     Status: Normal (Preliminary result)   Collection Time   09/23/11  4:46 AM      Component Value Range Status Comment   Specimen Description BLOOD LEFT ARM   Final    Special Requests BOTTLES DRAWN AEROBIC AND ANAEROBIC 2.5 CC EACH   Final    Culture  Setup Time 09/23/2011 11:13   Final    Culture     Final    Value:        BLOOD CULTURE RECEIVED NO GROWTH TO DATE CULTURE WILL BE HELD FOR 5 DAYS BEFORE ISSUING A FINAL NEGATIVE REPORT   Report Status PENDING   Incomplete   MRSA PCR SCREENING     Status: Normal   Collection Time   09/23/11  7:28 AM      Component Value Range Status Comment   MRSA by PCR NEGATIVE  NEGATIVE Final     Radiology Reports Dg  Chest 1 View  09/22/2011  *RADIOLOGY REPORT*  Clinical  Data: 78 year old female with left hip fracture - preoperative respiratory examination.  CHEST - 1 VIEW  Comparison: 08/29/2011 and prior chest radiographs  Findings: A moderate to large left pleural effusion is again noted with increasing left lower lung atelectasis. COPD/emphysema is again identified with increasing pulmonary vascular congestion. Mild right basilar opacity is noted - suspect atelectasis. There is no evidence of pneumothorax. No acute bony abnormalities are present. Surgical clips in the right axillary region are again noted.  IMPRESSION: Unchanged moderate to large left pleural effusion with increasing left lower lung atelectasis.  Pulmonary vascular congestion.  Mild right basilar opacity - suspect atelectasis.  Original Report Authenticated By: Rosendo Gros, M.D.   Dg Hip Complete Left  09/22/2011  *RADIOLOGY REPORT*  Clinical Data: Fall.  LEFT HIP - COMPLETE 2+ VIEW  Comparison: None  Findings: The bones are diffusely osteopenic.  There is an acute subcapital femoral neck fracture involving the proximal left femur. There is mild superior and lateral displacement of the distal fracture fragments.  Metallic stents are noted within the iliac vessels.  IMPRESSION:  1.  Acute left-sided subcapital femoral neck fracture.  Original Report Authenticated By: Rosealee Albee, M.D.    Scheduled Meds:    . amiodarone  200 mg Oral Daily  . aspirin  325 mg Oral Daily  . atorvastatin  40 mg Oral q1800  . chlorhexidine  60 mL Topical Once  . docusate sodium  100 mg Oral BID  . enoxaparin (LOVENOX) injection  30 mg Subcutaneous Q24H  . ferrous sulfate  325 mg Oral Q breakfast  . furosemide  40 mg Oral Daily  . lisinopril  2.5 mg Oral Daily  . loratadine  10 mg Oral Daily  . metoprolol succinate  50 mg Oral Daily  . pantoprazole  40 mg Oral Q1200  . potassium chloride  10 mEq Intravenous Q1 Hr x 3  . potassium chloride  20 mEq Oral Daily  . potassium chloride SA  40 mEq Oral Daily  .  sodium chloride  3 mL Intravenous Q12H  . vancomycin  1,000 mg Intravenous 60 min Pre-Op  . DISCONTD: amiodarone  200 mg Oral BID  . DISCONTD: diltiazem  30 mg Oral TID  . DISCONTD: vancomycin  1,000 mg Intravenous 60 min Pre-Op  . DISCONTD: vancomycin  1,000 mg Intravenous 60 min Pre-Op  . DISCONTD: vancomycin  1,000 mg Intravenous 60 min Pre-Op   Continuous Infusions:    . DISCONTD: dextrose 5 % and 0.45% NaCl 35 mL/hr (09/23/11 0953)  . DISCONTD: dextrose 5 % and 0.45% NaCl     PRN Meds:.albuterol, HYDROcodone-acetaminophen, HYDROcodone-acetaminophen, methocarbamol (ROBAXIN) IV, methocarbamol, morphine injection, morphine injection, ondansetron (ZOFRAN) IV, ondansetron, polyethylene glycol, sorbitol

## 2011-09-24 NOTE — Progress Notes (Signed)
09-24-11 0330 NSG:  Pt is currently a FULL CODE -  However Dr Waunita Schooner PN of 8-3 clearly states 2 times that pt is a NO CODE BLUE -  We need clarification
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
## 2011-09-24 NOTE — Procedures (Signed)
Procedure : left thoracentesis Specimen: 480 ml yellow serous fluid Complications : none immediate  Fluid sent to lab Post CXR pending

## 2011-09-24 NOTE — Progress Notes (Signed)
09-24-11 0200  NSG :  Lab reports that patient's blood culture came back POSITIVE anarobic, gram positive cocci in clusters

## 2011-09-24 NOTE — Progress Notes (Addendum)
Subjective: Day of Surgery Procedure(s) (LRB): ARTHROPLASTY BIPOLAR HIP (Left) this patient's alert oriented x4 she is ready to undergo surgery for left hip. Dr. Shireen Quan anesthesiologist has noted one of 2 blood cultures has returned positive. These cultures were drawn 2 days ago. Dr. Thedore Mins the hospitalist involved has indicated  concerned regarding the positive blood culture. Her white cell count was also elevated and appears to be rising with 19,000 white cells on her CBC. Chest x-ray showed a left pleural effusion and she has only started antibiotics today. Hemoglobin and hematocrit are improved. Patient reports pain as 5 on 0-10 scale and 6 on 0-10 scale.  Pain is mild.  Objective: Vital signs in last 24 hours: Temp:  [97.6 F (36.4 C)-98.5 F (36.9 C)] 98 F (36.7 C) (08/04 0642) Pulse Rate:  [76-87] 87  (08/04 0642) Resp:  [18-20] 18  (08/04 0642) BP: (98-128)/(62-78) 114/78 mmHg (08/04 0642) SpO2:  [92 %-98 %] 97 % (08/03 2130)  Intake/Output from previous day: 08/03 0701 - 08/04 0700 In: 500 [P.O.:480; I.V.:20] Out: 1150 [Urine:1150] Intake/Output this shift:     Basename 09/24/11 0515 09/23/11 0830 09/22/11 1852  HGB 10.7* 10.2* 10.8*    Basename 09/24/11 0515 09/23/11 0830  WBC 19.0* 16.7*  RBC 4.20 3.98  HCT 33.9* 31.8*  PLT 392 362    Basename 09/24/11 0515 09/23/11 0446  NA 137 138  K 4.2 3.3*  CL 99 100  CO2 32 28  BUN 25* 25*  CREATININE 1.12* 0.84  GLUCOSE 99 161*  CALCIUM 9.3 9.1    Basename 09/23/11 0940 09/22/11 1852  LABPT -- --  INR 1.11 1.19    Neurologically intact ABD soft Sensation intact distally Dorsiflexion/Plantar flexion intact No cellulitis present Compartment soft  Assessment/Plan: Day of Surgery Procedure(s) (LRB): ARTHROPLASTY BIPOLAR HIP (Left) Elevated white cell count with positive blood culture rule out systemic infection.  Plan: I talked with Dr. Thedore Mins and Dr. Shireen Quan and in light of possible bacteremia and  possible infection of unknown origin patient's surgery a left hip hemiarthroplasty will be delayed. Allow her to have oral nutrition today and plan to cancel her case today then rescheduled for tomorrow or the next day depending on results of an infection workup. Blood culture result may potentially be a contaminant however in the face of elevating white cell count it is best to wait while the patient undergoes a workup and has treatment of an underlying infectious process for 48 hours or until her white cell count is improved. Patient's surgery is canceled and her family was informed. She may return to regular ward bed.  Salvador Bigbee E 09/24/2011, 7:42 AM

## 2011-09-25 ENCOUNTER — Encounter (HOSPITAL_COMMUNITY)
Admission: EM | Disposition: A | Discharge status: Skilled Nursing Facility | Payer: Self-pay | Source: Home / Self Care | Attending: Internal Medicine

## 2011-09-25 ENCOUNTER — Inpatient Hospital Stay (HOSPITAL_COMMUNITY): Payer: Medicare Other

## 2011-09-25 ENCOUNTER — Inpatient Hospital Stay (HOSPITAL_COMMUNITY): Payer: Medicare Other | Admitting: Anesthesiology

## 2011-09-25 ENCOUNTER — Encounter (HOSPITAL_COMMUNITY): Payer: Self-pay | Admitting: Anesthesiology

## 2011-09-25 HISTORY — PX: HIP ARTHROPLASTY: SHX981

## 2011-09-25 LAB — BASIC METABOLIC PANEL
BUN: 26 mg/dL — ABNORMAL HIGH (ref 6–23)
Calcium: 8.9 mg/dL (ref 8.4–10.5)
Creatinine, Ser: 0.95 mg/dL (ref 0.50–1.10)
GFR calc non Af Amer: 55 mL/min — ABNORMAL LOW (ref >90)
Glucose, Bld: 107 mg/dL — ABNORMAL HIGH (ref 70–99)

## 2011-09-25 LAB — CBC
Hemoglobin: 10.5 g/dL — ABNORMAL LOW (ref 12.0–15.0)
MCH: 24.7 pg — ABNORMAL LOW (ref 26.0–34.0)
MCH: 25.1 pg — ABNORMAL LOW (ref 26.0–34.0)
MCHC: 30.7 g/dL (ref 30.0–36.0)
Platelets: 383 10*3/uL (ref 150–400)
Platelets: 329 10*3/uL (ref 150–400)
RBC: 4.35 MIL/uL (ref 3.87–5.11)
RDW: 16.9 % — ABNORMAL HIGH (ref 11.5–15.5)
WBC: 21.8 10*3/uL — ABNORMAL HIGH (ref 4.0–10.5)

## 2011-09-25 LAB — CULTURE, BLOOD (ROUTINE X 2)

## 2011-09-25 LAB — GLUCOSE, CAPILLARY: Glucose-Capillary: 115 mg/dL — ABNORMAL HIGH (ref 70–99)

## 2011-09-25 SURGERY — HEMIARTHROPLASTY, HIP, DIRECT ANTERIOR APPROACH, FOR FRACTURE
Anesthesia: General | Site: Hip | Laterality: Left | Wound class: Clean

## 2011-09-25 MED ORDER — ONDANSETRON HCL 4 MG/2ML IJ SOLN
INTRAMUSCULAR | Status: DC | PRN
  Administered 2011-09-25: 4 mg via INTRAVENOUS

## 2011-09-25 MED ORDER — CEFAZOLIN SODIUM 1-5 GM-% IV SOLN
1.0000 g | INTRAVENOUS | Status: DC
  Filled 2011-09-25: qty 50

## 2011-09-25 MED ORDER — SODIUM CHLORIDE 0.9 % IV SOLN: INTRAVENOUS | Status: AC

## 2011-09-25 MED ORDER — STERILE WATER FOR IRRIGATION IR SOLN
Status: DC | PRN
  Administered 2011-09-25: 1500 mL

## 2011-09-25 MED ORDER — ETOMIDATE 2 MG/ML IV SOLN
INTRAVENOUS | Status: DC | PRN
  Administered 2011-09-25: 14 mg via INTRAVENOUS

## 2011-09-25 MED ORDER — ENOXAPARIN SODIUM 30 MG/0.3ML ~~LOC~~ SOLN
30.0000 mg | SUBCUTANEOUS | Status: DC
  Administered 2011-09-26 – 2011-09-28 (×3): 30 mg via SUBCUTANEOUS
  Filled 2011-09-25 (×4): qty 0.3

## 2011-09-25 MED ORDER — LACTATED RINGERS IV SOLN
INTRAVENOUS | Status: DC | PRN
  Administered 2011-09-25: 20:00:00 via INTRAVENOUS

## 2011-09-25 MED ORDER — SUCCINYLCHOLINE CHLORIDE 20 MG/ML IJ SOLN
INTRAMUSCULAR | Status: DC | PRN
  Administered 2011-09-25: 80 mg via INTRAVENOUS

## 2011-09-25 MED ORDER — CHLORHEXIDINE GLUCONATE 4 % EX LIQD
60.0000 mL | Freq: Once | CUTANEOUS | Status: DC
  Filled 2011-09-25: qty 60

## 2011-09-25 MED ORDER — HYDROMORPHONE HCL PF 1 MG/ML IJ SOLN: 0.2500 mg | INTRAMUSCULAR | Status: DC | PRN

## 2011-09-25 MED ORDER — FENTANYL CITRATE 0.05 MG/ML IJ SOLN
INTRAMUSCULAR | Status: DC | PRN
  Administered 2011-09-25 (×2): 50 ug via INTRAVENOUS

## 2011-09-25 MED ORDER — FENTANYL CITRATE 0.05 MG/ML IJ SOLN: 25.0000 ug | INTRAMUSCULAR | Status: DC | PRN

## 2011-09-25 MED ORDER — CEFAZOLIN SODIUM 1-5 GM-% IV SOLN
INTRAVENOUS | Status: DC | PRN
  Administered 2011-09-25: 2 g via INTRAVENOUS

## 2011-09-25 MED ORDER — PROMETHAZINE HCL 25 MG/ML IJ SOLN
6.2500 mg | INTRAMUSCULAR | Status: DC | PRN
  Administered 2011-09-25: 6.25 mg via INTRAVENOUS

## 2011-09-25 MED ORDER — CHLORHEXIDINE GLUCONATE 4 % EX LIQD
60.0000 mL | Freq: Once | CUTANEOUS | Status: DC
Start: 2011-09-25 — End: 2011-09-25
  Filled 2011-09-25: qty 60

## 2011-09-25 MED ORDER — CEFAZOLIN SODIUM-DEXTROSE 2-3 GM-% IV SOLR
2.0000 g | Freq: Four times a day (QID) | INTRAVENOUS | Status: AC
  Administered 2011-09-25 – 2011-09-26 (×2): 2 g via INTRAVENOUS
  Filled 2011-09-25 (×2): qty 50

## 2011-09-25 MED ORDER — ENOXAPARIN SODIUM 30 MG/0.3ML ~~LOC~~ SOLN
30.0000 mg | SUBCUTANEOUS | Status: DC
  Filled 2011-09-25: qty 0.3

## 2011-09-25 MED ORDER — EPHEDRINE SULFATE 50 MG/ML IJ SOLN
INTRAMUSCULAR | Status: DC | PRN
  Administered 2011-09-25 (×3): 5 mg via INTRAVENOUS

## 2011-09-25 MED ORDER — SODIUM CHLORIDE 0.9 % IV SOLN
INTRAVENOUS | Status: DC
  Administered 2011-09-25: 50 mL/h via INTRAVENOUS

## 2011-09-25 SURGICAL SUPPLY — 41 items
ADH SKN CLS APL DERMABOND .7 (GAUZE/BANDAGES/DRESSINGS) ×1
BAG ZIPLOCK 12X15 (MISCELLANEOUS) ×2 IMPLANT
BLADE SAW SAG 73X25 THK (BLADE) ×1
BLADE SAW SGTL 73X25 THK (BLADE) ×1 IMPLANT
BRUSH FEMORAL CANAL (MISCELLANEOUS) IMPLANT
CLOTH BEACON ORANGE TIMEOUT ST (SAFETY) ×2 IMPLANT
DERMABOND ADVANCED (GAUZE/BANDAGES/DRESSINGS) ×1
DERMABOND ADVANCED .7 DNX12 (GAUZE/BANDAGES/DRESSINGS) ×1 IMPLANT
DRAPE INCISE IOBAN 66X45 STRL (DRAPES) ×2 IMPLANT
DRAPE ORTHO SPLIT 77X108 STRL (DRAPES) ×2
DRAPE POUCH INSTRU U-SHP 10X18 (DRAPES) ×2 IMPLANT
DRAPE SURG ORHT 6 SPLT 77X108 (DRAPES) ×1 IMPLANT
DRSG EMULSION OIL 3X16 NADH (GAUZE/BANDAGES/DRESSINGS) ×2 IMPLANT
DRSG MEPILEX BORDER 4X8 (GAUZE/BANDAGES/DRESSINGS) ×2 IMPLANT
DRSG PAD ABDOMINAL 8X10 ST (GAUZE/BANDAGES/DRESSINGS) ×2 IMPLANT
ELECT REM PT RETURN 9FT ADLT (ELECTROSURGICAL) ×2
ELECTRODE REM PT RTRN 9FT ADLT (ELECTROSURGICAL) ×1 IMPLANT
EVACUATOR 1/8 PVC DRAIN (DRAIN) ×2 IMPLANT
FACESHIELD LNG OPTICON STERILE (SAFETY) ×8 IMPLANT
GLOVE ECLIPSE 8.5 STRL (GLOVE) ×4 IMPLANT
GOWN STRL REIN XL XLG (GOWN DISPOSABLE) ×2 IMPLANT
HANDPIECE INTERPULSE COAX TIP (DISPOSABLE)
IMMOBILIZER KNEE 20 (SOFTGOODS) ×2
IMMOBILIZER KNEE 20 THIGH 36 (SOFTGOODS) ×1 IMPLANT
MANIFOLD NEPTUNE II (INSTRUMENTS) IMPLANT
NEEDLE MA TROC 1/2 (NEEDLE) ×2 IMPLANT
PACK TOTAL JOINT (CUSTOM PROCEDURE TRAY) ×2 IMPLANT
PASSER SUT SWANSON 36MM LOOP (INSTRUMENTS) ×2 IMPLANT
PILLOW ABDUCTION HIP (SOFTGOODS) ×2 IMPLANT
POSITIONER SURGICAL ARM (MISCELLANEOUS) ×2 IMPLANT
SET HNDPC FAN SPRY TIP SCT (DISPOSABLE) IMPLANT
SPONGE GAUZE 4X4 12PLY (GAUZE/BANDAGES/DRESSINGS) ×4 IMPLANT
STAPLER VISISTAT 35W (STAPLE) ×2 IMPLANT
SUT ETHIBOND NAB CT1 #1 30IN (SUTURE) ×6 IMPLANT
SUT VIC AB 1 CTX 36 (SUTURE) ×8
SUT VIC AB 1 CTX36XBRD ANBCTR (SUTURE) ×4 IMPLANT
SUT VIC AB 2-0 CT1 27 (SUTURE) ×4
SUT VIC AB 2-0 CT1 27XBRD (SUTURE) ×2 IMPLANT
TOWEL OR 17X26 10 PK STRL BLUE (TOWEL DISPOSABLE) ×4 IMPLANT
TOWER CARTRIDGE SMART MIX (DISPOSABLE) IMPLANT
TRAY FOLEY CATH 14FRSI W/METER (CATHETERS) IMPLANT

## 2011-09-25 NOTE — Anesthesia Postprocedure Evaluation (Signed)
  Anesthesia Post-op Note  Patient: [name]  Procedure(s) Performed: Procedure(s) (LRB): ARTHROPLASTY BIPOLAR HIP (Left)  Patient Location: PACU  Anesthesia Type: General  Level of Consciousness: awake and alert   Airway and Oxygen Therapy: Patient Spontanous Breathing  Post-op Pain: mild  Post-op Assessment: Post-op Vital signs reviewed, Patient's Cardiovascular Status Stable, Respiratory Function Stable, Patent Airway and No signs of Nausea or vomiting  Post-op Vital Signs: stable  Complications: No apparent anesthesia complications. Nausea treated.

## 2011-09-25 NOTE — Transfer of Care (Signed)
Immediate Anesthesia Transfer of Care Note  Patient: [name]  Procedure(s) Performed: Procedure(s) (LRB): ARTHROPLASTY BIPOLAR HIP (Left)  Patient Location: PACU  Anesthesia Type: General  Level of Consciousness: awake, sedated and patient cooperative  Airway & Oxygen Therapy: Patient Spontanous Breathing and Patient connected to face mask oxygen  Post-op Assessment: Report given to PACU RN and Post -op Vital signs reviewed and stable  Post vital signs: Reviewed and stable  Complications: No apparent anesthesia complications

## 2011-09-25 NOTE — Op Note (Signed)
09/22/2011 - 09/25/2011  7:52 PM  PATIENT:  [name]  78 y.o. female  MRN: [id number]  OPERATIVE REPORT  PRE-OPERATIVE DIAGNOSIS:  Left femoral neck fracture  POST-OPERATIVE DIAGNOSIS:  Left femoral neck fracture  PROCEDURE:  Procedure(s): ARTHROPLASTY BIPOLAR HIP(Left) #6 press-fit Depuy femoral component -3 neck 48 mm unipolar head     SURGEON:  Kerrin Champagne, MD       ANESTHESIA:  General, Dr. Council Mechanic    COMPLICATIONS:  None.     COMPONENTS: #6 press-fit Depuy femoral component -3 neck 48 mm unipolar head.   PROCEDURE IN DETAIL: The patient was met in the holding area and  identified. The appropriate left  hip was identified and marked at the operative site. Preoperative antibiotics 2 g Ancef were given . The patient was then transported to the OR and  placed under general l anesthesia. Transferred to the operating room table At that point, the patient was  placed in the lateral decubitus position with the operative left side up and  secured to the operating room table with the Innomed hip system.  The operative lower extremity was prepped from the iliac crest to the distal  leg with DuraPrep. Sterile draping was  performed.  A routine southern incision was utilized and via sharp dissection  carried down to the subcutaneous tissue. Gross bleeders were Bovie  coagulated. The iliotibial band was quickly identified and incised  along the length of the skin incision. Self-retaining retractors were  inserted. With the hip internally rotated, the short external rotators  were identified. Tendinous structures were tagged with 0 Ethibond  suture. The hip capsule was identified and incised along the femoral  neck and head. Femoral neck fracture was identified displaced posteriorly and into varus angulation. The femoral neck was then osteotomized using a  calcar guide using an oscillating saw and a cobra retractor and femoral head removed from the acetabulum using a  corkscrew.The femoral neck osteotomy was placed about 1 fingerbreadth proximal to the lesser trochanter. Acetabulum was then carefully debrided of bony fracture  fragments that were attached to synovium. Irrigation was carried out of the acetabulum any small fragments removed. The acetabulum was then trialed following measurement of the resected femoral head initial 48 mm head exam which demonstrated excellent fit. A 48 mm head for unipolar implant was chosen. A starter hole was then made through the piriformis fossa using a cookie cutter osteotome. Canal finder was utilized then lateralizing reamer. Rasping was performed sequentially to the appropriate #6 uncemented rasp. This rasp was then left in place and calcar planing was carried out using a small calcar planer. A reduction was then carried out using +0 and then -3 femoral neck with 48mm head unable to reduce the +0 neck. The 3 millimeter neck with 48 mm head was able to be reduced and showed excellent leg with inability to flex well over 120 and internally rotated almost 80 without signs of subluxation. Excellent stability and full extension and external rotation noted. The #6 femoral stem with a -3 neck and 48 mm head was then chosen. The trial components were then removed. The joint was copiously  irrigated with saline solution.The femoral component was then impacted onto the calcar. Wound was again irrigated.   We cleaned the [facility] taper neck and  inserted the final head. This was reduced, and through a full range of motion, it was perfectly stable and there was no subluxation. There was no evidence of  instability. Flexion was  possible over 115 internal rotation nearly 80 without dislocation. It had a very nice construct.  Wound was then irrigated with saline solution. The capsule was closed  anatomically with #1 Ethibond. The piriformis tendon was then carefully approximated the greater tuberosity and insertion of the gluteus muscles  posteriorly. The wound was again irrigated with saline  solution. The iliotibial band was closed with interrupted #1 Ethibond, subcu  was closed with #1 Vicryl subcutaneous layers were reapproximated with interrupted  2-0 Vicryl sutures.Skin was closed with a running subcutaneous suture of 4-0 Vicryl and Dermabond. All instruments and sponge counts were correct Mepilex dressing was applied. Knee immobilizer was placed with posterior stays removed.. The patient was then placed in the supine position,  awoken, placed on the operating stretcher, and returned to the  postanesthesia recovery room in satisfactory condition.       [name]  09/25/2011, 7:52 PM

## 2011-09-25 NOTE — Progress Notes (Signed)
CARE MANAGEMENT NOTE 09/25/2011  Patient:  [name], [name]   Account Number:  [id number]  Date Initiated:  09/25/2011  Documentation initiated by:  Colleen Can  Subjective/Objective Assessment:   dx left displaced hip frature, plans for surgery     Action/Plan:   Pt is from home with spouse. Will follow for needs post op   Anticipated DC Date:  09/28/2011   Anticipated DC Plan:  incompleet at this time              Status of service:  In process, will continue to follow  Per UR Regulation:  Reviewed for med. necessity/level of care/duration of stay
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
## 2011-09-25 NOTE — Progress Notes (Signed)
Subjective:  Very pleasant. No SOB, no CP. Laying in bed.  Thoracentesis yesterday.   Objective:  Vital Signs in the last 24 hours: Temp:  [97.7 F (36.5 C)-98.1 F (36.7 C)] 97.7 F (36.5 C) (08/05 0528) Pulse Rate:  [73-87] 76  (08/05 0528) Resp:  [16] 16  (08/05 0528) BP: (86-141)/(49-77) 129/77 mmHg (08/05 0528) SpO2:  [82 %-92 %] 92 % (08/05 0528) Weight:  [40.1 kg (88 lb 6.5 oz)] 40.1 kg (88 lb 6.5 oz) (08/05 0528)  Intake/Output from previous day: 08/04 0701 - 08/05 0700 In: 1043 [P.O.:840; I.V.:203] Out: 1150 [Urine:1150]   Physical Exam: General: Frail, thin, in no acute distress. Head:  Normocephalic and atraumatic. Extremities: No clubbing or cyanosis. No edema. Neurologic: Alert and oriented x 3.    Lab Results:  Basename 09/25/11 0410 09/24/11 0515  WBC 17.2* 19.0*  HGB 10.5* 10.7*  PLT 383 392    Basename 09/25/11 0410 09/24/11 0515  NA 137 137  K 3.8 4.2  CL 100 99  CO2 27 32  GLUCOSE 107* 99  BUN 26* 25*  CREATININE 0.95 1.12*   No results found for this basename: TROPONINI:2,CK,MB:2 in the last 72 hours Hepatic Function Panel  Basename 09/23/11 0446  PROT 6.8  ALBUMIN 2.7*  AST 20  ALT 19  ALKPHOS 87  BILITOT 0.3  BILIDIR --  IBILI --  Imaging:  Personally viewed.   Telemetry: NSR Personally viewed.    Cardiac Studies:  EF 35%  Assessment/Plan:  Principal Problem:  *Hip fracture Active Problems:  Acute on chronic systolic CHF (congestive heart failure), NYHA class 3  COPD (chronic obstructive pulmonary disease)  Failure to thrive in adult  Anemia due to chronic illness  Leukocytosis  Preop cardiovascular exam  1. Agree with Dr. Jens Som that she is at increased risk from a CV standpoint. EF 35%, PVD. She appears comfortable now. Watch closely for signs of fluid overload.  She is optimized from a CHF standpoint for surgery. Thoracentesis done.   Encourage nutrition.   Surgery planned for this evening.   Family aware  of increased risk. See prior notes.   Will follow with you.    [name] 09/25/2011, 8:10 AM

## 2011-09-25 NOTE — Progress Notes (Addendum)
09-25-11 0200 NSG:  It was reported to me by day rn (and supported by md's PN) that pt will most likely be having surgery today.  However no orders placed for pre op.    I have reinitiated NPO after MN, will re-order CHG bath, restart IVF at 0600.  Consent and preop labs, xrays are still wnls.  No preop antibiotic order (had vanco on 8-4 preop) --- pt is BC + and PN mentiosn starting her on IV Levaquin and another PN mentions "stopping all antibiotics" - currently NO antibiotics ordered for this pt -  please clarify

## 2011-09-25 NOTE — Preoperative (Signed)
Beta Blockers   Reason not to administer Beta Blockers:Not Applicable 

## 2011-09-25 NOTE — Progress Notes (Signed)
Patient's WBC slightly improved. She can probably have breakfast as any surgery would be an add on after 5 PM tonight. I will make NPO post breakfast today. But wait to post pending medicine's evaluation today.

## 2011-09-25 NOTE — Progress Notes (Signed)
[facility]                                                                                Patient Demographics  [name], is a 78 y.o. female  UJW:[id number]  FAO:[id number]  DOB - 03/17/1929  Admit date - 09/22/2011  Admitting Physician Jennifer Murray, MD  Outpatient Primary MD for the patient is Jennifer Alken, MD  LOS - 3   Chief Complaint  Patient presents with  . Fall        Assessment & Plan   1. Trip and  fall with left-sided hip - patient awaits echogram and cardiology to see in to the OR for open reduction internal fixation of her left. Regardless of the tests and procedures patient will be a moderate to high risk for adverse cardiopulmonary outcome during the perioperative period due to her age and underlying comorbidities. Surgery can be done on 09/25/2011, blood cultures have confirmed the suspicion of contamination one out of 2 positive gram-positive cocci in clusters which is coag-negative staph I have confirmed this personally with the lab this morning. He should remains afebrile, nonspecific leukocytosis likely due to community-acquired pneumonia, however safe for surgery no signs of active bacteremia.   Cardio-Pulm Risk stratification for surgery and recommendations to minimize the same:-  A.Cardio-Pulmonary Risk -  this patient is a moderate to high for adverse Cardio-Pulmonary  Outcome  from surgery, the risks and benefits were discussed and acceptable to patient and her daughter  Recommendations for optimizing Cardio-Pulmonary  Risk risk factors  1. Keep SBP<140, HR<85, use Lopressor 5mg  IV q4hrs PRN, or B.Blocker drip PRN. 2. Tight I&O goal to keep even. 3. Minimal sedation. 4. Good pulmunary toilet. 5. Scheduled/PRN Nebs ordered keep Pox>90% 6. Hb>8, transfuse as needed- Lasix 10mg  IV after each unit PRBC Transfused.   Will request Surgeon to please Order Lovenox/DVT prophylaxis of his/her choice when OK from  Surgeon's standpoint post op.     2. Anemia of chronic disease no acute issues try to keep hemoglobin over 8.     3. Leukocytosis is likely reactionary-she is afebrile does not have cough and is not short of breath, however continues to have a questionable opacity in the right middle lobe, due to pending surgery have placed her on Levaquin on 09/24/2011, and Will repeat chest x-ray again in one to 2 days.    4. Moderate to large left-sided pleural effusion which is likely subacute to chronic - she underwent thoracentesis by IR on 09/24/2011 as she had positive blood cultures and we wanted to rule out every possible source of infection, fluid appears noninfected however borderline transudate versus exudate, clinically appears to be transudate, have her follow with pulmonology as outpatient no acute issues.   5. History of heart failure likely chronic systolic CHF with last known EF of 35%-echo to evaluate heart function as requested by cardiology last night, patient appears clinically compensated despite pleural effusion, we'll continue home dose Lasix, gentle IV fluids when she is n.p.o. awaiting surgery, IV fluids to be stopped once she is eating. We'll skip her next 2 doses of aspirin for upcoming surgery. ACE inhibitor has been stopped as her  creatinine bumped slightly after putting her on ACE, defer to her primary cardiologist to place her on ACE or aren't as outpatient.    6. History of COPD currently compensated no wheezing, when necessary nebulizer continue 2 L nasal cannula oxygen which is her baseline.     7. History of PAD with recent stenting of her femoral arteries, aspirin prescription for the next 2 doses, outpatient risk factor modulation.     8. History of atrial fibrillation goal will be rate control, patient is on calcium channel blocker, beta blocker, amiodarone, we'll continue cardiology to follow. She is on full dose aspirin and no other anti-coagulation at  baseline.    9. Mild elevation of creatinine after placing her on ACE inhibitor, stopped ACE - creatinine is stable, will defer to her primary cardiologist to initiate ACE/carb as outpatient.    Code Status: Full  Family Communication: Discussed with the patient, and her daughter [name] over the phone on 09/23/2011 and in person on 09/24/2011  Disposition Plan: SNF    Procedures   Echo,  L Hip ORIF 08- 05- 13, left-sided thoracentesis by IR  09/24/2011.    Consults  Cardiology   Antibiotics  Stopped all   Time Spent in minutes   40   DVT Prophylaxis  Lovenox    [name] M.D on 09/25/2011 at 10:03 AM  Between 7am to 7pm - Pager - [phone]  After 7pm go to [email or web address] - password [email or web address]  And look for the night coverage person covering for me after hours  [facility] Office  [phone]    Subjective:   [name] today has, No headache, No chest pain, No abdominal pain - No Nausea, No new weakness tingling or numbness, No Cough - SOB. Does have pain around her left hip fracture site.  Objective:   Filed Vitals:   09/24/11 1420 09/24/11 2104 09/25/11 0202 09/25/11 0528  BP: 113/66 96/64 141/71 129/77  Pulse: 80 74 73 76  Temp: 98 F (36.7 C) 97.9 F (36.6 C) 98.1 F (36.7 C) 97.7 F (36.5 C)  TempSrc: Oral Oral Oral Oral  Resp: 16 16 16 16   Height:      Weight:    40.1 kg (88 lb 6.5 oz)  SpO2: 91% 91% 92% 92%    Wt Readings from Last 3 Encounters:  09/25/11 40.1 kg (88 lb 6.5 oz)  09/25/11 40.1 kg (88 lb 6.5 oz)  08/22/11 43.999 kg (97 lb)     Intake/Output Summary (Last 24 hours) at 09/25/11 1003 Last data filed at 09/25/11 0502  Gross per 24 hour  Intake   1043 ml  Output   1150 ml  Net   -107 ml    Exam Awake Alert, Oriented X 3, No new F.N deficits, Normal affect [place].AT,PERRAL Supple Neck,No JVD, No cervical lymphadenopathy appriciated.  Symmetrical Chest wall movement, Good air movement bilaterally,  CTAB RRR,No Gallops,Rubs or new Murmurs, No Parasternal Heave +ve B.Sounds, Abd Soft, Non tender, No organomegaly appriciated, No rebound - guarding or rigidity. No Cyanosis, Clubbing or edema, No new Rash or bruise      Data Review   CBC  Lab 09/25/11 0410 09/24/11 0515 09/23/11 0830 09/22/11 1852  WBC 17.2* 19.0* 16.7* 20.1*  HGB 10.5* 10.7* 10.2* 10.8*  HCT 34.2* 33.9* 31.8* 33.9*  PLT 383 392 362 394  MCV 80.5 80.7 79.9 79.2  MCH 24.7* 25.5* 25.6* 25.2*  MCHC 30.7 31.6 32.1 31.9  RDW 16.9* 16.8* 16.3* 16.4*  LYMPHSABS -- -- -- 0.6*  MONOABS -- -- -- 0.8  EOSABS -- -- -- 0.0  BASOSABS -- -- -- 0.0  BANDABS -- -- -- --    Chemistries   Lab 09/25/11 0410 09/24/11 0515 09/23/11 0446 09/22/11 1852  NA 137 137 138 139  K 3.8 4.2 3.3* 3.8  CL 100 99 100 98  CO2 27 32 28 31  GLUCOSE 107* 99 161* 105*  BUN 26* 25* 25* 25*  CREATININE 0.95 1.12* 0.84 0.98  CALCIUM 8.9 9.3 9.1 9.1  MG -- 2.1 -- 2.1  AST -- -- 20 22  ALT -- -- 19 18  ALKPHOS -- -- 87 88  BILITOT -- -- 0.3 0.4   ------------------------------------------------------------------------------------------------------------------ estimated creatinine clearance is 30.4 ml/min (by C-G formula based on Cr of 0.95). ------------------------------------------------------------------------------------------------------------------ No results found for this basename: HGBA1C:2 in the last 72 hours ------------------------------------------------------------------------------------------------------------------ No results found for this basename: CHOL:2,HDL:2,LDLCALC:2,TRIG:2,CHOLHDL:2,LDLDIRECT:2 in the last 72 hours ------------------------------------------------------------------------------------------------------------------  [facility] 09/23/11 0446  TSH 1.817  T4TOTAL --  T3FREE --  THYROIDAB --    ------------------------------------------------------------------------------------------------------------------ No results found for this basename: VITAMINB12:2,FOLATE:2,FERRITIN:2,TIBC:2,IRON:2,RETICCTPCT:2 in the last 72 hours  Coagulation profile  Lab 09/23/11 0940 09/22/11 1852  INR 1.11 1.19  PROTIME -- --    No results found for this basename: DDIMER:2 in the last 72 hours  Cardiac Enzymes No results found for this basename: CK:3,CKMB:3,TROPONINI:3,MYOGLOBIN:3 in the last 168 hours ------------------------------------------------------------------------------------------------------------------ No components found with this basename: POCBNP:3  Micro Results Recent Results (from the past 240 hour(s))  URINE CULTURE     Status: Normal   Collection Time   09/22/11  6:33 PM      Component Value Range Status Comment   Specimen Description URINE, CATHETERIZED   Final    Special Requests NONE   Final    Culture  Setup Time 09/22/2011 23:16   Final    Colony Count NO GROWTH   Final    Culture NO GROWTH   Final    Report Status 09/23/2011 FINAL   Final   CULTURE, BLOOD (ROUTINE X 2)     Status: Normal   Collection Time   09/22/11 11:00 PM      Component Value Range Status Comment   Specimen Description BLOOD LEFT ARM   Final    Special Requests BOTTLES DRAWN AEROBIC AND ANAEROBIC   Final    Culture  Setup Time 09/23/2011 03:38   Final    Culture     Final    Value: STAPHYLOCOCCUS SPECIES (COAGULASE NEGATIVE)     Note: THE SIGNIFICANCE OF ISOLATING THIS ORGANISM FROM A SINGLE SET OF BLOOD CULTURES WHEN MULTIPLE SETS ARE DRAWN IS UNCERTAIN. PLEASE NOTIFY THE MICROBIOLOGY DEPARTMENT WITHIN ONE WEEK IF SPECIATION AND SENSITIVITIES ARE REQUIRED.     Note: Gram Stain Report Called to,Read Back By and Verified With: Francesco Sor RN on 09/24/11 at 01:53 by Christie Nottingham   Report Status 09/25/2011 FINAL   Final   CULTURE, BLOOD (ROUTINE X 2)     Status: Normal (Preliminary result)    Collection Time   09/23/11  4:46 AM      Component Value Range Status Comment   Specimen Description BLOOD LEFT ARM   Final    Special Requests BOTTLES DRAWN AEROBIC AND ANAEROBIC 2.5 CC EACH   Final    Culture  Setup Time 09/23/2011 11:13   Final    Culture     Final    Value:  BLOOD CULTURE RECEIVED NO GROWTH TO DATE CULTURE WILL BE HELD FOR 5 DAYS BEFORE ISSUING A FINAL NEGATIVE REPORT   Report Status PENDING   Incomplete   MRSA PCR SCREENING     Status: Normal   Collection Time   09/23/11  7:28 AM      Component Value Range Status Comment   MRSA by PCR NEGATIVE  NEGATIVE Final     Radiology Reports Dg Chest 1 View  09/22/2011  *RADIOLOGY REPORT*  Clinical Data: 78 year old female with left hip fracture - preoperative respiratory examination.  CHEST - 1 VIEW  Comparison: 08/29/2011 and prior chest radiographs  Findings: A moderate to large left pleural effusion is again noted with increasing left lower lung atelectasis. COPD/emphysema is again identified with increasing pulmonary vascular congestion. Mild right basilar opacity is noted - suspect atelectasis. There is no evidence of pneumothorax. No acute bony abnormalities are present. Surgical clips in the right axillary region are again noted.  IMPRESSION: Unchanged moderate to large left pleural effusion with increasing left lower lung atelectasis.  Pulmonary vascular congestion.  Mild right basilar opacity - suspect atelectasis.  Original Report Authenticated By: Rosendo Gros, M.D.   Dg Hip Complete Left  09/22/2011  *RADIOLOGY REPORT*  Clinical Data: Fall.  LEFT HIP - COMPLETE 2+ VIEW  Comparison: None  Findings: The bones are diffusely osteopenic.  There is an acute subcapital femoral neck fracture involving the proximal left femur. There is mild superior and lateral displacement of the distal fracture fragments.  Metallic stents are noted within the iliac vessels.  IMPRESSION:  1.  Acute left-sided subcapital femoral neck fracture.   Original Report Authenticated By: Rosealee Albee, M.D.    Scheduled Meds:    . amiodarone  200 mg Oral Daily  . aspirin  325 mg Oral Daily  . atorvastatin  40 mg Oral q1800  . chlorhexidine  60 mL Topical Once  . docusate sodium  100 mg Oral BID  . enoxaparin (LOVENOX) injection  30 mg Subcutaneous Q24H  . ferrous sulfate  325 mg Oral Q breakfast  . furosemide  40 mg Oral Daily  . loratadine  10 mg Oral Daily  . metoprolol succinate  50 mg Oral Daily  . pantoprazole  40 mg Oral Q1200  . potassium chloride  20 mEq Oral Daily  . potassium chloride SA  40 mEq Oral Daily  . sodium chloride  3 mL Intravenous Q12H  . DISCONTD: lisinopril  2.5 mg Oral Daily   Continuous Infusions:    . sodium chloride 50 mL/hr at 09/24/11 1300   PRN Meds:.albuterol, HYDROcodone-acetaminophen, HYDROcodone-acetaminophen, methocarbamol (ROBAXIN) IV, methocarbamol, morphine injection, morphine injection, ondansetron (ZOFRAN) IV, ondansetron, polyethylene glycol, sorbitol

## 2011-09-25 NOTE — Progress Notes (Signed)
Subjective: No complaints somewhat disappointed that not having had surgery yesterday. Word from Dr. Thedore Mins she is cleared for surgery today single blood culture that was positive it is growing a contaminant from skin. Patient has had pleural effusion to have this is negative for infection. I called the operating room and we'll schedule this patient for this is day after 5:30 PM. She is class ASA IV however as she is an emergency case will follow elective surgeries posted for today.   Objective: Vital signs in last 24 hours: Temp:  [97.7 F (36.5 C)-98.1 F (36.7 C)] 97.7 F (36.5 C) (08/05 0528) Pulse Rate:  [73-87] 76  (08/05 0528) Resp:  [16] 16  (08/05 0528) BP: (96-141)/(64-77) 129/77 mmHg (08/05 0528) SpO2:  [91 %-92 %] 92 % (08/05 0528) Weight:  [40.1 kg (88 lb 6.5 oz)] 40.1 kg (88 lb 6.5 oz) (08/05 0528)  Intake/Output from previous day: 08/04 0701 - 08/05 0700 In: 1043 [P.O.:840; I.V.:203] Out: 1150 [Urine:1150] Intake/Output this shift:     Basename 09/25/11 0410 09/24/11 0515 09/23/11 0830 09/22/11 1852  HGB 10.5* 10.7* 10.2* 10.8*    Basename 09/25/11 0410 09/24/11 0515  WBC 17.2* 19.0*  RBC 4.25 4.20  HCT 34.2* 33.9*  PLT 383 392    Basename 09/25/11 0410 09/24/11 0515  NA 137 137  K 3.8 4.2  CL 100 99  CO2 27 32  BUN 26* 25*  CREATININE 0.95 1.12*  GLUCOSE 107* 99  CALCIUM 8.9 9.3    Basename 09/23/11 0940 09/22/11 1852  LABPT -- --  INR 1.11 1.19    Neurologically intact ABD soft Sensation intact distally Intact pulses distally Dorsiflexion/Plantar flexion intact No cellulitis present Compartment soft  Assessment/Plan: Left subcapital hip fracture with significant comorbidities. She has been seen by cardiology and explained that she is at high risk for cardiac complication due to coronary artery disease and history of congestive heart COPD. She understands the risks and benefits of procedures discussed with her left hip hemiarthroplasty pore  coated order to mobilize her and try and get her back to her preoperative independent ambulation status. We'll proceed with surgery this evening when the OR is available. Expected after 5:30 PM.   [name] E 09/25/2011, 11:23 AM

## 2011-09-25 NOTE — Anesthesia Preprocedure Evaluation (Addendum)
Anesthesia Evaluation  Patient identified by MRN, date of birth, ID band Patient awake    Reviewed: Allergy & Precautions, H&P , NPO status , Patient's Chart, lab work & pertinent test results  Airway Mallampati: II TM Distance: >3 FB Neck ROM: Full    Dental No notable dental hx.    Pulmonary COPD COPD inhaler,  Left Pleural effusion was drained yesterday. breath sounds clear to auscultation  Pulmonary exam normal       Cardiovascular hypertension, Pt. on medications and Pt. on home beta blockers + Peripheral Vascular Disease and +CHF negative cardio ROS  + dysrhythmias Atrial Fibrillation Rhythm:Regular Rate:Normal  EF 35%   Neuro/Psych  Neuromuscular disease negative psych ROS   GI/Hepatic negative GI ROS, Neg liver ROS,   Endo/Other  negative endocrine ROS  Renal/GU negative Renal ROS  negative genitourinary   Musculoskeletal negative musculoskeletal ROS (+)   Abdominal   Peds negative pediatric ROS (+)  Hematology negative hematology ROS (+)   Anesthesia Other Findings   Reproductive/Obstetrics negative OB ROS                          Anesthesia Physical Anesthesia Plan  ASA: III  Anesthesia Plan: General   Post-op Pain Management:    Induction: Intravenous  Airway Management Planned: Oral ETT  Additional Equipment:   Intra-op Plan:   Post-operative Plan: Extubation in OR  Informed Consent: I have reviewed the patients History and Physical, chart, labs and discussed the procedure including the risks, benefits and alternatives for the proposed anesthesia with the patient or authorized representative who has indicated his/her understanding and acceptance.   Dental advisory given  Plan Discussed with: CRNA  Anesthesia Plan Comments:         Anesthesia Quick Evaluation

## 2011-09-25 NOTE — Brief Op Note (Signed)
09/22/2011 - 09/25/2011  7:48 PM  PATIENT:  [name]  78 y.o. female  PRE-OPERATIVE DIAGNOSIS:  Left femoral neck fracture  POST-OPERATIVE DIAGNOSIS:  Left femoral neck fracture  PROCEDURE:  Procedure(s) (LRB): ARTHROPLASTY BIPOLAR HIP (Left) #6 press-fit Depuy femoral component -3 neck 48 mm unipolar head.  SURGEON:  Surgeon(s) and Role:    * Kerrin Champagne, MD - Primary  ANESTHESIA:   general  EBL:  Total I/O In: 500 [I.V.:500] Out: 100 [Blood:100]  BLOOD ADMINISTERED:none  DRAINS: Urinary Catheter (Foley)   LOCAL MEDICATIONS USED:  NONE  SPECIMEN:  No Specimen  DISPOSITION OF SPECIMEN:  N/A  COUNTS:  YES  TOURNIQUET:  * No tourniquets in log *  DICTATION: .Dragon Dictation  PLAN OF CARE: Admit to inpatient   PATIENT DISPOSITION:  PACU - hemodynamically stable.   Delay start of Pharmacological VTE agent (>24hrs) due to surgical blood loss or risk of bleeding: no

## 2011-09-26 DIAGNOSIS — J449 Chronic obstructive pulmonary disease, unspecified: Secondary | ICD-10-CM

## 2011-09-26 LAB — CBC
HCT: 32.5 % — ABNORMAL LOW (ref 36.0–46.0)
Hemoglobin: 10 g/dL — ABNORMAL LOW (ref 12.0–15.0)
MCV: 81.7 fL (ref 78.0–100.0)
RDW: 17.2 % — ABNORMAL HIGH (ref 11.5–15.5)
WBC: 17.7 10*3/uL — ABNORMAL HIGH (ref 4.0–10.5)

## 2011-09-26 LAB — BASIC METABOLIC PANEL
CO2: 23 mEq/L (ref 19–32)
Calcium: 8.1 mg/dL — ABNORMAL LOW (ref 8.4–10.5)
Chloride: 102 mEq/L (ref 96–112)
GFR calc Af Amer: 90 mL/min — ABNORMAL LOW (ref >90)
Sodium: 137 mEq/L (ref 135–145)

## 2011-09-26 LAB — GLUCOSE, CAPILLARY: Glucose-Capillary: 84 mg/dL (ref 70–99)

## 2011-09-26 NOTE — Progress Notes (Signed)
Subjective: 1 Day Post-Op Procedure(s) (LRB): ARTHROPLASTY BIPOLAR HIP (Left)Alert, oriented x 4 and awake. Patient reports pain as 3 on 0-10 scale and 4 on 0-10 scale.    Objective: Vital signs in last 24 hours: Temp:  [97.4 F (36.3 C)-98.4 F (36.9 C)] 98.4 F (36.9 C) (08/06 0447) Pulse Rate:  [59-85] 79  (08/06 0447) Resp:  [16-24] 18  (08/06 0447) BP: (102-147)/(62-86) 120/62 mmHg (08/06 0447) SpO2:  [91 %-100 %] 99 % (08/06 0447)  Intake/Output from previous day: 08/05 0701 - 08/06 0700 In: 2150 [I.V.:2100; IV Piggyback:50] Out: 1900 [Urine:1700; Blood:200] Intake/Output this shift:     Basename 09/25/11 2220 09/25/11 0410 09/24/11 0515  HGB 10.9* 10.5* 10.7*    Basename 09/25/11 2220 09/25/11 0410  WBC 21.8* 17.2*  RBC 4.35 4.25  HCT 34.9* 34.2*  PLT 329 383    Basename 09/26/11 0420 09/25/11 0410  NA 137 137  K 3.6 3.8  CL 102 100  CO2 23 27  BUN 21 26*  CREATININE 0.78 0.95  GLUCOSE 94 107*  CALCIUM 8.1* 8.9    Basename 09/23/11 0940  LABPT --  INR 1.11    Neurologically intact ABD soft Sensation intact distally Intact pulses distally Incision: dressing C/D/I  Assessment/Plan: 1 Day Post-Op Procedure(s) (LRB): ARTHROPLASTY BIPOLAR HIP (Left) Advance diet Up with therapy Discharge to SNF  Ayonna Speranza E 09/26/2011, 9:02 AM

## 2011-09-26 NOTE — Progress Notes (Signed)
Physical Therapy Treatment Note   09/26/11 1400  PT Visit Information  Last PT Received On 09/26/11  Assistance Needed +2  PT Time Calculation  PT Start Time 1346  PT Stop Time 1404  PT Time Calculation (min) 18 min  Subjective Data  Subjective You are so patient.  Precautions  Precautions Posterior Hip;Fall  Precaution Comments handout posted in room  Required Braces or Orthoses Knee Immobilizer - Left  Knee Immobilizer - Left On except when in CPM (on at all times except with PT)  Restrictions  LLE Weight Bearing WBAT  Cognition  Overall Cognitive Status Appears within functional limits for tasks assessed/performed  Bed Mobility  Bed Mobility Sit to Supine  Sit to Supine 3: Mod assist;With rail  Details for Bed Mobility Assistance verbal cues for technique, assist for LEs onto bed  Transfers  Transfers Stand to Sit;Sit to Stand;Stand Pivot Transfers  Sit to Stand 3: Mod assist;With upper extremity assist;From elevated surface;From chair/3-in-1  Stand to Sit 3: Mod assist;With upper extremity assist;To bed  Stand Pivot Transfers 3: Mod assist  Details for Transfer Assistance verbal cues for safe technique and precautions, assist for weakness, pt became very fatigued and rested to sit halfway through transfer so had pt lean back on PT for support while taking little steps back to bed, pt reports her upper body is so weak and she fatigues quickly.  Total Joint Exercises  Ankle Circles/Pumps AROM;Both;10 reps  Short Arc Quad AAROM;Strengthening;Left;10 reps  [employer] AROM;Both;10 reps  Hip ABduction/ADduction AAROM;Strengthening;Left;10 reps  Heel Slides AAROM;Strengthening;10 reps;Left  PT - End of Session  Equipment Utilized During Treatment Gait belt  Activity Tolerance Patient limited by fatigue;Patient limited by pain  Patient left in bed;with call bell/phone within reach;with nursing in room  PT - Assessment/Plan  Comments on Treatment Session Pt assisted back to bed  and performed exercises.  Reviewed posterior hip precautions and handout posted in room.  PT Plan Discharge plan remains appropriate;Frequency remains appropriate  Follow Up Recommendations Skilled nursing facility  Equipment Recommended Defer to next venue  Acute Rehab PT Goals  PT Goal: Sit to Supine/Side - Progress Progressing toward goal  PT Goal: Sit to Stand - Progress Progressing toward goal  PT Goal: Stand to Sit - Progress Progressing toward goal  PT Goal: Perform Home Exercise Program - Progress Progressing toward goal  PT General Charges  $$ ACUTE PT VISIT 1 Procedure  PT Treatments  $Therapeutic Activity 8-22 mins    Zenovia Jarred, PT Pager: [phone]

## 2011-09-26 NOTE — Progress Notes (Signed)
Subjective:  Pain in left lower extremity. Migrating discomfort. She denies any chest pain, shortness of breath. Nausea noted previously. Spoke to nursing, she has recently received pain medication.  Objective:  Vital Signs in the last 24 hours: Temp:  [97.4 F (36.3 C)-98.4 F (36.9 C)] 98.4 F (36.9 C) (08/06 0447) Pulse Rate:  [59-85] 79  (08/06 0447) Resp:  [16-24] 18  (08/06 0447) BP: (102-147)/(62-86) 120/62 mmHg (08/06 0447) SpO2:  [91 %-100 %] 99 % (08/06 0447)  Intake/Output from previous day: 08/05 0701 - 08/06 0700 In: 2150 [I.V.:2100; IV Piggyback:50] Out: 1900 [Urine:1700; Blood:200]   Physical Exam: General:Thin in no acute distress. Head:  Normocephalic and atraumatic. Lungs: Clear to auscultation and percussion. Heart: Normal S1 and S2.  No murmur, rubs or gallops.  Abdomen: soft, non-tender, positive bowel sounds. Extremities: No clubbing or cyanosis. No edema. Left foot warm, good perfusion.  Neurologic: Alert and oriented x 3.    Lab Results:  Basename 09/25/11 2220 09/25/11 0410  WBC 21.8* 17.2*  HGB 10.9* 10.5*  PLT 329 383    Basename 09/26/11 0420 09/25/11 0410  NA 137 137  K 3.6 3.8  CL 102 100  CO2 23 27  GLUCOSE 94 107*  BUN 21 26*  CREATININE 0.78 0.95     Telemetry: NSR Personally viewed.   Cardiac Studies:  EF 35%  Assessment/Plan:  Principal Problem:  *Hip fracture Active Problems:  Acute on chronic systolic CHF (congestive heart failure), NYHA class 3  COPD (chronic obstructive pulmonary disease)  Failure to thrive in adult  Anemia due to chronic illness  Leukocytosis  Preop cardiovascular exam  Doing well from CV standpoint. No arrhythmias on telemetry. Lung exam is normal. No dyspnea while laying down. Discomfort in her Left extremity post surgery. Continue to monitor. Continue with current medication strategy. Continue to watch ins and outs. Slightly increased white blood cell count. Monitor. Continue with  amiodarone. No evidence of atrial fibrillation (seen on prior hospitalization). If further assistance is needed please call. Signing off.   SKAINS, MARK 09/26/2011, 8:09 AM

## 2011-09-26 NOTE — Progress Notes (Signed)
[facility]                                                                                Patient Demographics  [name], is a 78 y.o. female  ZOX:[id number]  WJX:[id number]  DOB - Feb 03, 1930  Admit date - 09/22/2011  Admitting Physician Alison Murray, MD  Outpatient Primary MD for the patient is [name], MD  LOS - 4   Chief Complaint  Patient presents with  . Fall        Assessment & Plan   Pleasant 78 year old Caucasian female admitted to the hospital for trip and fall causing left-sided hip fracture, patient is status post open reduction internal fixation by orthopedic surgery on 09/24/2028, is tolerating the procedure well, and has history of chronic systolic CHF along with COPD which are stable, cardiology and orthopedics the following the patient. Patient does have history of chronic leukocytosis for which outpatient hematology followup is recommended.  She also had left-sided pleural effusion and one set of blood cultures which was positive which has now proven to be a contaminant. However prior to her surgery when the cultures were inconclusive in order to look for infectious source left-sided pleural effusion was tapped, the pleural effusion appears to be noninfectious, however inconclusive whether it is transudate or exudate, one time outpatient pulmonary followup is recommended.    1. Trip and  fall with left-sided hip - status post open reduction internal fixation by orthopedic surgery on 09/25/2011, patient has tolerated the procedure well, postop H&H is stable, tolerating PT OT, will request social work to look into placement. Cast with orthopedic surgeon Dr. Otelia Sergeant today.    2. Anemia of chronic disease no acute issues try to keep hemoglobin over 8. No acute issues.     3. Chronic Leukocytosis - had high white count even going back few months in the chart, one time outpatient he hematology followup  recommended.     4. Moderate to large left-sided pleural effusion which is likely subacute to chronic - she underwent thoracentesis by IR on 09/24/2011 as she had positive blood cultures and we wanted to rule out every possible source of infection, fluid appears noninfected however borderline transudate versus exudate, clinically appears to be transudate, Cultures are so far negative, cytology pending, have her follow with pulmonology as outpatient no acute issues.     5. History of heart failure likely chronic systolic CHF with last known EF of 35%-echo to evaluate heart function as requested by cardiology last night, patient appears clinically compensated despite pleural effusion, we'll continue home dose Lasix, gentle IV fluids when she is n.p.o. awaiting surgery, IV fluids to be stopped once she is eating. We'll skip her next 2 doses of aspirin for upcoming surgery. ACE inhibitor has been stopped as her creatinine bumped slightly after putting her on ACE, defer to her primary cardiologist to place her on ACE or aren't as outpatient.    6. History of COPD currently compensated no wheezing, when necessary nebulizer continue 2 L nasal cannula oxygen which is her baseline.     7. History of PAD with recent stenting of her femoral arteries, aspirin prescription for the next  2 doses, outpatient risk factor modulation.     8. History of atrial fibrillation goal will be rate control, patient is on calcium channel blocker, beta blocker, amiodarone, we'll continue cardiology to follow. She is on full dose aspirin and no other anti-coagulation at baseline.    9. Mild elevation of creatinine after placing her on ACE inhibitor, stopped ACE - creatinine is stable, will defer to her primary cardiologist to initiate ACE/carb as outpatient.    Code Status: Full  Family Communication: Discussed with the patient, and [name] over the phone on 09/23/2011 and in person on  09/24/2011  Disposition Plan: SNF    Procedures   Echo,  L Hip ORIF 08- 05- 13, left-sided thoracentesis by IR  09/24/2011.    Consults  Cardiology   Antibiotics  Stopped all   Time Spent in minutes   40   DVT Prophylaxis  Lovenox    Leroy Sea M.D on 09/26/2011 at 10:56 AM  Between 7am to 7pm - Pager - [phone]  After 7pm go to [email or web address] - password [email or web address]  And look for the night coverage person covering for me after hours  [facility] Office  [phone]    Subjective:   [name] today has, No headache, No chest pain, No abdominal pain - No Nausea, No new weakness tingling or numbness, No Cough - SOB. Does have pain around her left hip fracture site.  Objective:   Filed Vitals:   09/26/11 0000 09/26/11 0215 09/26/11 0400 09/26/11 0447  BP:  124/68  120/62  Pulse:  59  79  Temp:  97.7 F (36.5 C)  98.4 F (36.9 C)  TempSrc:  Oral  Oral  Resp: 16 19 18 18   Height:      Weight:      SpO2:  96%  99%    Wt Readings from Last 3 Encounters:  09/25/11 40.1 kg (88 lb 6.5 oz)  09/25/11 40.1 kg (88 lb 6.5 oz)  09/25/11 40.1 kg (88 lb 6.5 oz)     Intake/Output Summary (Last 24 hours) at 09/26/11 1056 Last data filed at 09/26/11 0448  Gross per 24 hour  Intake   1550 ml  Output   1900 ml  Net   -350 ml    Exam Awake Alert, Oriented X 3, No new F.N deficits, Normal affect Sultan.AT,PERRAL Supple Neck,No JVD, No cervical lymphadenopathy appriciated.  Symmetrical Chest wall movement, Good air movement bilaterally, CTAB RRR,No Gallops,Rubs or new Murmurs, No Parasternal Heave +ve B.Sounds, Abd Soft, Non tender, No organomegaly appriciated, No rebound - guarding or rigidity. No Cyanosis, Clubbing or edema, No new Rash or bruise      Data Review   CBC  Lab 09/26/11 0420 09/25/11 2220 09/25/11 0410 09/24/11 0515 09/23/11 0830 09/22/11 1852  WBC 17.7* 21.8* 17.2* 19.0* 16.7* --  HGB 10.0* 10.9* 10.5* 10.7* 10.2* --  HCT 32.5*  34.9* 34.2* 33.9* 31.8* --  PLT 361 329 383 392 362 --  MCV 81.7 80.2 80.5 80.7 79.9 --  MCH 25.1* 25.1* 24.7* 25.5* 25.6* --  MCHC 30.8 31.2 30.7 31.6 32.1 --  RDW 17.2* 16.9* 16.9* 16.8* 16.3* --  LYMPHSABS -- -- -- -- -- 0.6*  MONOABS -- -- -- -- -- 0.8  EOSABS -- -- -- -- -- 0.0  BASOSABS -- -- -- -- -- 0.0  BANDABS -- -- -- -- -- --    Chemistries   Lab 09/26/11 0420 09/25/11 0410 09/24/11 0515 09/23/11 0446 09/22/11  1852  NA 137 137 137 138 139  K 3.6 3.8 4.2 3.3* 3.8  CL 102 100 99 100 98  CO2 23 27 32 28 31  GLUCOSE 94 107* 99 161* 105*  BUN 21 26* 25* 25* 25*  CREATININE 0.78 0.95 1.12* 0.84 0.98  CALCIUM 8.1* 8.9 9.3 9.1 9.1  MG -- -- 2.1 -- 2.1  AST -- -- -- 20 22  ALT -- -- -- 19 18  ALKPHOS -- -- -- 87 88  BILITOT -- -- -- 0.3 0.4   ------------------------------------------------------------------------------------------------------------------ estimated creatinine clearance is 36.1 ml/min (by C-G formula based on Cr of 0.78). ------------------------------------------------------------------------------------------------------------------ No results found for this basename: HGBA1C:2 in the last 72 hours ------------------------------------------------------------------------------------------------------------------ No results found for this basename: CHOL:2,HDL:2,LDLCALC:2,TRIG:2,CHOLHDL:2,LDLDIRECT:2 in the last 72 hours ------------------------------------------------------------------------------------------------------------------ No results found for this basename: TSH,T4TOTAL,FREET3,T3FREE,THYROIDAB in the last 72 hours ------------------------------------------------------------------------------------------------------------------ No results found for this basename: VITAMINB12:2,FOLATE:2,FERRITIN:2,TIBC:2,IRON:2,RETICCTPCT:2 in the last 72 hours  Coagulation profile  Lab 09/23/11 0940 09/22/11 1852  INR 1.11 1.19  PROTIME -- --    No results  found for this basename: DDIMER:2 in the last 72 hours  Cardiac Enzymes No results found for this basename: CK:3,CKMB:3,TROPONINI:3,MYOGLOBIN:3 in the last 168 hours ------------------------------------------------------------------------------------------------------------------ No components found with this basename: POCBNP:3  Micro Results Recent Results (from the past 240 hour(s))  URINE CULTURE     Status: Normal   Collection Time   09/22/11  6:33 PM      Component Value Range Status Comment   Specimen Description URINE, CATHETERIZED   Final    Special Requests NONE   Final    Culture  Setup Time 09/22/2011 23:16   Final    Colony Count NO GROWTH   Final    Culture NO GROWTH   Final    Report Status 09/23/2011 FINAL   Final   CULTURE, BLOOD (ROUTINE X 2)     Status: Normal   Collection Time   09/22/11 11:00 PM      Component Value Range Status Comment   Specimen Description BLOOD LEFT ARM   Final    Special Requests BOTTLES DRAWN AEROBIC AND ANAEROBIC   Final    Culture  Setup Time 09/23/2011 03:38   Final    Culture     Final    Value: STAPHYLOCOCCUS SPECIES (COAGULASE NEGATIVE)     Note: THE SIGNIFICANCE OF ISOLATING THIS ORGANISM FROM A SINGLE SET OF BLOOD CULTURES WHEN MULTIPLE SETS ARE DRAWN IS UNCERTAIN. PLEASE NOTIFY THE MICROBIOLOGY DEPARTMENT WITHIN ONE WEEK IF SPECIATION AND SENSITIVITIES ARE REQUIRED.     Note: Gram Stain Report Called to,Read Back By and Verified With: Francesco Sor RN on 09/24/11 at 01:53 by Christie Nottingham   Report Status 09/25/2011 FINAL   Final   CULTURE, BLOOD (ROUTINE X 2)     Status: Normal (Preliminary result)   Collection Time   09/23/11  4:46 AM      Component Value Range Status Comment   Specimen Description BLOOD LEFT ARM   Final    Special Requests BOTTLES DRAWN AEROBIC AND ANAEROBIC 2.5 CC EACH   Final    Culture  Setup Time 09/23/2011 11:13   Final    Culture     Final    Value:        BLOOD CULTURE RECEIVED NO GROWTH TO DATE CULTURE  WILL BE HELD FOR 5 DAYS BEFORE ISSUING A FINAL NEGATIVE REPORT   Report Status PENDING   Incomplete   MRSA PCR SCREENING  Status: Normal   Collection Time   09/23/11  7:28 AM      Component Value Range Status Comment   MRSA by PCR NEGATIVE  NEGATIVE Final   BODY FLUID CULTURE     Status: Normal (Preliminary result)   Collection Time   09/24/11 11:08 AM      Component Value Range Status Comment   Specimen Description PLEURAL   Final    Special Requests Normal   Final    Gram Stain     Final    Value: RARE WBC PRESENT,BOTH PMN AND MONONUCLEAR     NO ORGANISMS SEEN   Culture NO GROWTH 2 DAYS   Final    Report Status PENDING   Incomplete     Radiology Reports Dg Chest 1 View  09/22/2011  *RADIOLOGY REPORT*  Clinical Data: 78 year old female with left hip fracture - preoperative respiratory examination.  CHEST - 1 VIEW  Comparison: 08/29/2011 and prior chest radiographs  Findings: A moderate to large left pleural effusion is again noted with increasing left lower lung atelectasis. COPD/emphysema is again identified with increasing pulmonary vascular congestion. Mild right basilar opacity is noted - suspect atelectasis. There is no evidence of pneumothorax. No acute bony abnormalities are present. Surgical clips in the right axillary region are again noted.  IMPRESSION: Unchanged moderate to large left pleural effusion with increasing left lower lung atelectasis.  Pulmonary vascular congestion.  Mild right basilar opacity - suspect atelectasis.  Original Report Authenticated By: Rosendo Gros, M.D.   Dg Hip Complete Left  09/22/2011  *RADIOLOGY REPORT*  Clinical Data: Fall.  LEFT HIP - COMPLETE 2+ VIEW  Comparison: None  Findings: The bones are diffusely osteopenic.  There is an acute subcapital femoral neck fracture involving the proximal left femur. There is mild superior and lateral displacement of the distal fracture fragments.  Metallic stents are noted within the iliac vessels.  IMPRESSION:  1.   Acute left-sided subcapital femoral neck fracture.  Original Report Authenticated By: Rosealee Albee, M.D.    Scheduled Meds:    . amiodarone  200 mg Oral Daily  . aspirin  325 mg Oral Daily  . atorvastatin  40 mg Oral q1800  .  ceFAZolin (ANCEF) IV  2 g Intravenous Q6H  . chlorhexidine  60 mL Topical Once  . docusate sodium  100 mg Oral BID  . enoxaparin (LOVENOX) injection  30 mg Subcutaneous Q24H  . ferrous sulfate  325 mg Oral Q breakfast  . furosemide  40 mg Oral Daily  . loratadine  10 mg Oral Daily  . metoprolol succinate  50 mg Oral Daily  . pantoprazole  40 mg Oral Q1200  . potassium chloride  20 mEq Oral Daily  . sodium chloride  3 mL Intravenous Q12H  . DISCONTD:  ceFAZolin (ANCEF) IV  1 g Intravenous 60 min Pre-Op  . DISCONTD: chlorhexidine  60 mL Topical Once  . DISCONTD: enoxaparin (LOVENOX) injection  30 mg Subcutaneous Q24H  . DISCONTD: enoxaparin (LOVENOX) injection  30 mg Subcutaneous Q24H   Continuous Infusions:    . sodium chloride    . DISCONTD: sodium chloride 50 mL/hr (09/25/11 1612)   PRN Meds:.albuterol, HYDROcodone-acetaminophen, methocarbamol (ROBAXIN) IV, methocarbamol, morphine injection, ondansetron (ZOFRAN) IV, ondansetron, polyethylene glycol, sorbitol, DISCONTD: fentaNYL, DISCONTD: HYDROcodone-acetaminophen, DISCONTD:  HYDROmorphone (DILAUDID) injection, DISCONTD:  morphine injection, DISCONTD: promethazine, DISCONTD: sterile water for irrigation

## 2011-09-26 NOTE — Evaluation (Signed)
Physical Therapy Evaluation Patient Details Name: [name] MRN: [id number] DOB: 1929/08/05 Today's Date: 09/26/2011 Time: 1001-1020 PT Time Calculation (min): 19 min  PT Assessment / Plan / Recommendation Clinical Impression  Pt admitted for L femur fx and s/p hip hemiarthroplasty.  Pt would benefit from acute PT services in order to improve independence with transfers and ambulation to prepare for d/c to SNF.  Pt reporting increased UE fatigue with ambulation limiting distance.  Demonstrated and  educated pt on posterior hip precautions and placed pillow between knees upon sitting up in recliner.    PT Assessment  Patient needs continued PT services    Follow Up Recommendations  Skilled nursing facility    Barriers to Discharge        Equipment Recommendations  Defer to next venue    Recommendations for Other Services     Frequency Min 5X/week    Precautions / Restrictions Precautions Precautions: Posterior Hip;Fall Required Braces or Orthoses: Knee Immobilizer - Left Knee Immobilizer - Left: On except when in CPM (on at all times except with PT) Restrictions LLE Weight Bearing: Weight bearing as tolerated   Pertinent Vitals/Pain 5/10 L hip pain, premedicated, repositioned, ice applied      Mobility  Bed Mobility Bed Mobility: Supine to Sit Supine to Sit: 4: Min assist;HOB elevated Details for Bed Mobility Assistance: assist for L LE, verbal cues for technique Transfers Transfers: Stand to Sit;Sit to Stand Sit to Stand: 3: Mod assist;With upper extremity assist;From elevated surface;From bed Stand to Sit: 3: Mod assist;With upper extremity assist;To chair/3-in-1 Details for Transfer Assistance: +2 for safety, verbal cues for safe technique and maintaining precautions Ambulation/Gait Ambulation/Gait Assistance: 4: Min assist Ambulation Distance (Feet): 15 Feet Assistive device: Rolling walker Ambulation/Gait Assistance Details: verbal cues for sequence, safe  RW distance, posture, pt required a couple short standing rest breaks due to upper body fatigue Gait Pattern: Step-to pattern;Decreased stance time - left;Trunk flexed Gait velocity: decreased    Exercises     PT Diagnosis: Difficulty walking;Acute pain  PT Problem List: Decreased strength;Decreased activity tolerance;Decreased mobility;Decreased knowledge of use of DME;Pain PT Treatment Interventions: DME instruction;Gait training;Patient/family education;Functional mobility training;Therapeutic activities;Therapeutic exercise   PT Goals Acute Rehab PT Goals PT Goal Formulation: With patient Time For Goal Achievement: 10/03/11 Potential to Achieve Goals: Good Pt will go Supine/Side to Sit: with supervision PT Goal: Supine/Side to Sit - Progress: Goal set today Pt will go Sit to Supine/Side: with supervision PT Goal: Sit to Supine/Side - Progress: Goal set today Pt will go Sit to Stand: with supervision PT Goal: Sit to Stand - Progress: Goal set today Pt will go Stand to Sit: with supervision PT Goal: Stand to Sit - Progress: Goal set today Pt will Ambulate: 51 - 150 feet;with supervision;with least restrictive assistive device PT Goal: Ambulate - Progress: Goal set today Pt will Perform Home Exercise Program: with supervision, verbal cues required/provided PT Goal: Perform Home Exercise Program - Progress: Goal set today  Visit Information  Last PT Received On: 09/26/11 Assistance Needed: +2    Subjective Data  Subjective: I've been in this bed too long.   Prior Functioning  Home Living Lives With: Spouse Type of Home: House Additional Comments: Agreeable to d/c to SNF Prior Function Level of Independence: Independent Communication Communication: No difficulties    Cognition  Overall Cognitive Status: Appears within functional limits for tasks assessed/performed Arousal/Alertness: Awake/alert Orientation Level: Appears intact for tasks assessed Behavior During  Session: [facility] for tasks performed  Extremity/Trunk Assessment Right Lower Extremity Assessment RLE ROM/Strength/Tone: WFL for tasks assessed Left Lower Extremity Assessment LLE ROM/Strength/Tone: Unable to fully assess;Due to precautions;Due to pain;Deficits LLE ROM/Strength/Tone Deficits: decreased active movement against gravity   Balance    End of Session PT - End of Session Equipment Utilized During Treatment: Gait belt Activity Tolerance: Patient limited by fatigue;Patient limited by pain Patient left: in chair;with call bell/phone within reach Nurse Communication: Precautions;Mobility status  GP     Marlen Mollica,KATHrine E 09/26/2011, 12:45 PM Pager: [phone]

## 2011-09-26 NOTE — Progress Notes (Addendum)
CSW provided bed offers to patient, husband & daughter, Rollene Fare at bedside. Patient's daughter & husband to tour facilities in the morning. CSW to follow-up with them tomorrow re: SNF decision.    Clinical Social Work Department CLINICAL SOCIAL WORK PLACEMENT NOTE 09/26/2011  Patient:  [name], [name]  Account Number:  [id number] Admit date:  09/22/2011  Clinical Social Worker:  Orpah Greek  Date/time:  09/24/2011 03:19 PM  Clinical Social Work is seeking post-discharge placement for this patient at the following level of care:   SKILLED NURSING   (*CSW will update this form in Epic as items are completed)   09/24/2011  Patient/family provided with [facility] Department of Clinical Social Work's list of facilities offering this level of care within the geographic area requested by the patient (or if unable, by the patient's family).  09/24/2011  Patient/family informed of their freedom to choose among providers that offer the needed level of care, that participate in Medicare, Medicaid or managed care program needed by the patient, have an available bed and are willing to accept the patient.  09/24/2011  Patient/family informed of [facility]' ownership interest in [facility], as well as of the fact that they are under no obligation to receive care at this facility.  PASARR submitted to EDS on 09/24/2011 PASARR number received from EDS on 09/24/2011  FL2 transmitted to all facilities in geographic area requested by pt/family on  09/25/2011 FL2 transmitted to all facilities within larger geographic area on   Patient informed that his/her managed care company has contracts with or will negotiate with  certain facilities, including the following:     Patient/family informed of bed offers received:  09/26/2011 Patient chooses bed at  Physician recommends and patient chooses bed at    Patient to be transferred to  on   Patient to be transferred to facility by    The following physician request were entered in Epic:   Additional Comments:   Unice Bailey, LCSW [facility] Clinical Social Worker cell #: [phone]

## 2011-09-27 LAB — BODY FLUID CULTURE: Culture: NO GROWTH

## 2011-09-27 LAB — HEMOGLOBIN AND HEMATOCRIT, BLOOD: HCT: 27.9 % — ABNORMAL LOW (ref 36.0–46.0)

## 2011-09-27 LAB — GLUCOSE, CAPILLARY: Glucose-Capillary: 127 mg/dL — ABNORMAL HIGH (ref 70–99)

## 2011-09-27 NOTE — Progress Notes (Signed)
CSW spoke with patient's daughter, [name] (c#: [id number]) who toured facilities with patient's husband and they have chosen [facility]. Anticipating discharge tomorrow. Daughter to complete admission paperwork with [facility] @ SNF today.   Unice Bailey, LCSW [facility] Clinical Social Worker cell #: [phone]

## 2011-09-27 NOTE — Progress Notes (Signed)
Physical Therapy Treatment Note   09/27/11 1500  PT Visit Information  Last PT Received On 09/27/11  Assistance Needed +2  PT Time Calculation  PT Start Time 1417  PT Stop Time 1430  PT Time Calculation (min) 13 min  Subjective Data  Subjective "I'm going down buttercup."  (pt fatigued from transfer and almost back to bed)  Precautions  Precautions Posterior Hip;Fall  Precaution Comments pt only able to state 2/3 precautions, all precautions reviewed  Required Braces or Orthoses Knee Immobilizer - Left  Knee Immobilizer - Left On except when in CPM (on at all times except with PT)  Restrictions  LLE Weight Bearing WBAT  Cognition  Overall Cognitive Status Appears within functional limits for tasks assessed/performed  Bed Mobility  Sit to Supine 3: Mod assist;With rail  Details for Bed Mobility Assistance verbal cues for technique, assist for LEs onto bed  Transfers  Transfers Sit to Stand;Stand to Sit  Sit to Stand 3: Mod assist;With upper extremity assist;From chair/3-in-1  Stand to Sit 3: Mod assist;With upper extremity assist;To bed  Details for Transfer Assistance verbal cues for safe technique and hip precautions, pt fatigues quickly, +2 for safety  Ambulation/Gait  Ambulation/Gait Assistance 1: +2 Total assist  Ambulation/Gait: Patient Percentage 60%  Ambulation Distance (Feet) 8 Feet  Assistive device Rolling walker  Ambulation/Gait Assistance Details max verbal cues for sequence, safe RW distance, and posture, pt fatigues quickly  Gait Pattern Step-to pattern;Decreased stance time - left;Trunk flexed  Gait velocity very slow  PT - End of Session  Equipment Utilized During Treatment Gait belt  Activity Tolerance Patient limited by fatigue;Patient limited by pain  Patient left in bed;with call bell/phone within reach  PT - Assessment/Plan  Comments on Treatment Session Pt able to ambuate 8 feet with +2 assist however continues to have poor activity tolerance so  assisted back to supine.  PT Plan Discharge plan remains appropriate;Frequency remains appropriate  Follow Up Recommendations Skilled nursing facility  Equipment Recommended Defer to next venue  Acute Rehab PT Goals  PT Goal: Sit to Supine/Side - Progress Progressing toward goal  PT Goal: Sit to Stand - Progress Progressing toward goal  PT Goal: Stand to Sit - Progress Progressing toward goal  PT Goal: Ambulate - Progress Progressing toward goal  PT General Charges  $$ ACUTE PT VISIT 1 Procedure  PT Treatments  $Gait Training 8-22 mins    Zenovia Jarred, PT Pager: [phone]

## 2011-09-27 NOTE — Progress Notes (Signed)
Subjective: 2 Days Post-Op Procedure(s) (LRB): ARTHROPLASTY BIPOLAR HIP (Left) Patient reports pain as mild. Just completed OT and PT and feels tired.  States she feels weak in her arms.  OT to supply theraband for pt to use. No dizziness with activity   Objective: Vital signs in last 24 hours: Temp:  [97.2 F (36.2 C)-98.4 F (36.9 C)] 97.2 F (36.2 C) (08/07 0429) Pulse Rate:  [73-84] 84  (08/07 0429) Resp:  [18] 18  (08/06 2052) BP: (92-139)/(59-81) 101/65 mmHg (08/07 1030) SpO2:  [91 %-96 %] 96 % (08/07 0429) Weight:  [40.1 kg (88 lb 6.5 oz)] 40.1 kg (88 lb 6.5 oz) (08/07 0500)  Intake/Output from previous day: 08/06 0701 - 08/07 0700 In: 480 [P.O.:480] Out: 800 [Urine:800] Intake/Output this shift: Total I/O In: 240 [P.O.:240] Out: 200 [Urine:200]   Basename 09/27/11 0445 09/26/11 0420 09/25/11 2220 09/25/11 0410  HGB 8.7* 10.0* 10.9* 10.5*    Basename 09/27/11 0445 09/26/11 0420 09/25/11 2220  WBC -- 17.7* 21.8*  RBC -- 3.98 4.35  HCT 27.9* 32.5* --  PLT -- 361 329    Basename 09/26/11 0420 09/25/11 0410  NA 137 137  K 3.6 3.8  CL 102 100  CO2 23 27  BUN 21 26*  CREATININE 0.78 0.95  GLUCOSE 94 107*  CALCIUM 8.1* 8.9   No results found for this basename: LABPT:2,INR:2 in the last 72 hours  Neurovascular intact Sensation intact distally Dorsiflexion/Plantar flexion intact Incision: no drainage  Assessment/Plan: 2 Days Post-Op Procedure(s) (LRB): ARTHROPLASTY BIPOLAR HIP (Left) Up with therapy Discharge to SNF when medically stable.  Currently orthopedically stable. ABL anemia:  Currently asymptomatic, continue to monitor. Treatment per medical team Continue WBAT and posterior hip replacement precautions. OV 2 week post op with DR Otelia Sergeant.  [name] M 09/27/2011, 11:05 AM

## 2011-09-27 NOTE — Progress Notes (Addendum)
Patient ID: [name], female   DOB: 24-Nov-1929, 78 y.o.   MRN: [id number]  [facility] PROGRESS NOTE  [name] UVO:[id number] DOB: 10/19/1929 DOA: 09/22/2011 PCP: Jennifer Alken, MD  Brief narrative: Pt admitted on 08/02 after sustaining fall and now is status post left hip arthroplasty post op day #2. Also status post left thoracentesis (08/04) with total of 580 cc of fluid removed.  Principal Problem:  *Hip fracture - status post left hip arthroplasty  - pt doing clinically well - PT recommends SNF placement - [facility] preferred - will proceed with placement  Active Problems:  Anemia due to chronic illness - now acute blood loss anemia, post op - CBC in AM   Leukocytosis - likely secondary to acute problem - WBC trending down - will obtain CBC in AM   Acute on chronic systolic CHF (congestive heart failure), NYHA class 3 - clinically stable, stable weight - continue home dose Lasix   COPD (chronic obstructive pulmonary disease) - clinically compensated - pt maintaining oxygen saturations > 93%   Failure to thrive in adult - BMI < 18 - encouraged PO intake   CKD, stage II - based on GFR - creatinine is stable and within normal limits   Atria Fibrillation - currently in normal sinus rhythm - rate controlled - continue Amiodarone   Left sided pleural effusion - status post left side thoracentesis (08/04) - pt clinically doing well  Consultants:  Orthopedics   Procedures/Studies: Dg Pelvis Portable 09/25/2011    IMPRESSION:   Expected appearance status post left hip arthroplasty.    Antibiotics:  None  Code Status: Full Family Communication: Pt at bedside Disposition Plan: Home when medically stable  HPI/Subjective: No events overnight. Pt reports feeling better.  Objective: Filed Vitals:   09/27/11 0429 09/27/11 0500 09/27/11 1030 09/27/11 1402  BP: 139/81  101/65 113/57  Pulse: 84   79    Temp: 97.2 F (36.2 C)   98.3 F (36.8 C)  TempSrc: Oral   Oral  Resp:    16  Height:      Weight:  88 lb 6.5 oz (40.1 kg)    SpO2: 96%   90%    Intake/Output Summary (Last 24 hours) at 09/27/11 1550 Last data filed at 09/27/11 1500  Gross per 24 hour  Intake    840 ml  Output    450 ml  Net    390 ml    Exam:   General:  Pt is alert, follows commands appropriately, not in acute distress  Cardiovascular: Regular rate and rhythm, S1/S2, SEM 2/6, no rubs, no gallops  Respiratory: Clear to auscultation bilaterally but slightly diminished at bases and crackles L>Rno wheezing, no rhonchi  Abdomen: Soft, non tender, non distended, bowel sounds present, no guarding  Extremities: No edema, pulses DP and PT palpable bilaterally  Neuro: Grossly nonfocal  Data Reviewed: Basic Metabolic Panel:  Lab 09/26/11 7425 09/25/11 0410 09/24/11 0515 09/23/11 0446 09/22/11 1852  NA 137 137 137 138 139  K 3.6 3.8 4.2 3.3* 3.8  CL 102 100 99 100 98  CO2 23 27 32 28 31  GLUCOSE 94 107* 99 161* 105*  BUN 21 26* 25* 25* 25*  CREATININE 0.78 0.95 1.12* 0.84 0.98  CALCIUM 8.1* 8.9 9.3 9.1 9.1  MG -- -- 2.1 -- 2.1  PHOS -- -- -- -- 4.3   Liver Function Tests:  Lab 09/23/11 0446 09/22/11 1852  AST 20 22  ALT  19 18  ALKPHOS 87 88  BILITOT 0.3 0.4  PROT 6.8 6.8  ALBUMIN 2.7* 2.8*   CBC:  Lab 09/27/11 0445 09/26/11 0420 09/25/11 2220 09/25/11 0410 09/24/11 0515 09/23/11 0830 09/22/11 1852  WBC -- 17.7* 21.8* 17.2* 19.0* 16.7* --  NEUTROABS -- -- -- -- -- -- 18.6*  HGB 8.7* 10.0* 10.9* 10.5* 10.7* -- --  HCT 27.9* 32.5* 34.9* 34.2* 33.9* -- --  MCV -- 81.7 80.2 80.5 80.7 79.9 --  PLT -- 361 329 383 392 362 --   CBG:  Lab 09/27/11 0733 09/26/11 0814 09/25/11 0508 09/23/11 0741  GLUCAP 127* 84 115* 89    Recent Results (from the past 240 hour(s))  URINE CULTURE     Status: Normal   Collection Time   09/22/11  6:33 PM      Component Value Range Status Comment   Specimen  Description URINE, CATHETERIZED   Final    Special Requests NONE   Final    Culture  Setup Time 09/22/2011 23:16   Final    Colony Count NO GROWTH   Final    Culture NO GROWTH   Final    Report Status 09/23/2011 FINAL   Final   CULTURE, BLOOD (ROUTINE X 2)     Status: Normal   Collection Time   09/22/11 11:00 PM      Component Value Range Status Comment   Specimen Description BLOOD LEFT ARM   Final    Special Requests BOTTLES DRAWN AEROBIC AND ANAEROBIC   Final    Culture  Setup Time 09/23/2011 03:38   Final    Culture     Final    Value: STAPHYLOCOCCUS SPECIES (COAGULASE NEGATIVE)     Note: THE SIGNIFICANCE OF ISOLATING THIS ORGANISM FROM A SINGLE SET OF BLOOD CULTURES WHEN MULTIPLE SETS ARE DRAWN IS UNCERTAIN. PLEASE NOTIFY THE MICROBIOLOGY DEPARTMENT WITHIN ONE WEEK IF SPECIATION AND SENSITIVITIES ARE REQUIRED.     Note: Gram Stain Report Called to,Read Back By and Verified With: Francesco Sor RN on 09/24/11 at 01:53 by Christie Nottingham   Report Status 09/25/2011 FINAL   Final   CULTURE, BLOOD (ROUTINE X 2)     Status: Normal (Preliminary result)   Collection Time   09/23/11  4:46 AM      Component Value Range Status Comment   Specimen Description BLOOD LEFT ARM   Final    Special Requests BOTTLES DRAWN AEROBIC AND ANAEROBIC 2.5 CC EACH   Final    Culture  Setup Time 09/23/2011 11:13   Final    Culture     Final    Value:        BLOOD CULTURE RECEIVED NO GROWTH TO DATE CULTURE WILL BE HELD FOR 5 DAYS BEFORE ISSUING A FINAL NEGATIVE REPORT   Report Status PENDING   Incomplete   MRSA PCR SCREENING     Status: Normal   Collection Time   09/23/11  7:28 AM      Component Value Range Status Comment   MRSA by PCR NEGATIVE  NEGATIVE Final   BODY FLUID CULTURE     Status: Normal   Collection Time   09/24/11 11:08 AM      Component Value Range Status Comment   Specimen Description PLEURAL   Final    Special Requests Normal   Final    Gram Stain     Final    Value: RARE WBC PRESENT,BOTH PMN AND  MONONUCLEAR  NO ORGANISMS SEEN   Culture NO GROWTH 3 DAYS   Final    Report Status 09/27/2011 FINAL   Final      Scheduled Meds:   . amiodarone  200 mg Oral Daily  . aspirin  325 mg Oral Daily  . atorvastatin  40 mg Oral q1800  . docusate sodium  100 mg Oral BID  . enoxaparin  injection  30 mg Subcutaneous Q24H  . ferrous sulfate  325 mg Oral Q breakfast  . furosemide  40 mg Oral Daily  . loratadine  10 mg Oral Daily  . metoprolol succinate  50 mg Oral Daily  . pantoprazole  40 mg Oral Q1200  . potassium chloride  20 mEq Oral Daily   Continuous Infusions:    Debbora Presto, MD  [facility] Pager [phone]  If 7PM-7AM, please contact night-coverage [email or web address] Password [email or web address] 09/27/2011, 3:50 PM   LOS: 5 days

## 2011-09-27 NOTE — Progress Notes (Signed)
Patient examined and lab reviewed with Vernon PA-C. 

## 2011-09-27 NOTE — Evaluation (Addendum)
Occupational Therapy Evaluation Patient Details Name: [name] MRN: [id number] DOB: [birth year] Today's Date: 09/27/2011 Time: 4098-1191 OT Time Calculation (min): 27 min  OT Assessment / Plan / Recommendation Clinical Impression  Pt admitted for L femur fx and s/p hip hemiarthroplasty. Pt displays decreased activity toleranace and strength, increased pain and overall decreased functional mobility and ADL. Will benefit from skilled OT services to improve ADL independence.     OT Assessment  Patient needs continued OT Services    Follow Up Recommendations  Skilled nursing facility    Barriers to Discharge      Equipment Recommendations  Defer to next venue    Recommendations for Other Services    Frequency  Min 2X/week    Precautions / Restrictions Precautions Precautions: Posterior Hip;Fall Precaution Comments: reviewed THP with pt. She could state 2/3 on her own. Restrictions Weight Bearing Restrictions: No LLE Weight Bearing: Weight bearing as tolerated        ADL  Eating/Feeding: Simulated;Independent Where Assessed - Eating/Feeding: Chair Grooming: Simulated;Wash/dry hands;Set up Where Assessed - Grooming: Supported sitting Upper Body Bathing: Simulated;Chest;Right arm;Left arm;Abdomen;Supervision/safety;Set up;Other (comment) (needs cues to not lean forward too far) Where Assessed - Upper Body Bathing: Unsupported sitting Lower Body Bathing: Simulated;Maximal assistance;Other (comment) (pt fatigues rapidly. Unable to let go of RW to wash periarea) Where Assessed - Lower Body Bathing: Supported sit to stand Upper Body Dressing: Simulated;Minimal assistance Where Assessed - Upper Body Dressing: Unsupported sitting Lower Body Dressing: Simulated;+1 Total assistance Where Assessed - Lower Body Dressing: Supported sit to stand Toilet Transfer: Simulated;Moderate assistance Toilet Transfer Method: Sit to stand Toileting - Clothing Manipulation and Hygiene:  Simulated;+1 Total assistance Where Assessed - Glass blower/designer Manipulation and Hygiene: Standing Tub/Shower Transfer Method: Not assessed Equipment Used: Rolling walker ADL Comments: Pt able to state 2/3 hip precautions. Reviewed all with pt. Her UEs fatigue rapidly. Worked on two trials of standing but pt only about to stand for about 5-7 seconds each trial. Pt had worked with PT earlier also and reports feeling fatigued from that session and also not sleeping well.     OT Diagnosis: Generalized weakness  OT Problem List: Decreased strength;Decreased activity tolerance;Decreased knowledge of use of DME or AE;Decreased knowledge of precautions;Pain OT Treatment Interventions: Self-care/ADL training;Therapeutic activities;DME and/or AE instruction;Patient/family education   OT Goals Acute Rehab OT Goals OT Goal Formulation: With patient Time For Goal Achievement: 10/04/11 Potential to Achieve Goals: Good ADL Goals Pt Will Perform Lower Body Bathing: with mod assist;Sit to stand from chair;Sit to stand from bed;with adaptive equipment ADL Goal: Lower Body Bathing - Progress: Goal set today Pt Will Perform Lower Body Dressing: with mod assist;Sit to stand from chair;Sit to stand from bed;with adaptive equipment ADL Goal: Lower Body Dressing - Progress: Goal set today Pt Will Transfer to Toilet: with min assist;Stand pivot transfer;3-in-1;with DME ADL Goal: Toilet Transfer - Progress: Goal set today Pt Will Perform Toileting - Clothing Manipulation: with mod assist;Standing ADL Goal: Toileting - Clothing Manipulation - Progress: Goal set today Additional ADL Goal #1: Pt will tolerate standing with her RW for 5 minutes or greater in order to complete her BADLs. ADL Goal: Additional Goal #1 - Progress: Goal set today Additional ADL Goal #2: Pt will perform theraband exercises with supervision to improve UE use in ADL ADL Goal: Additional Goal #2 - Progress: Goal set today  Visit  Information  Last OT Received On: 09/27/11 Assistance Needed: +2    Subjective Data  Subjective: I want to  get better Patient Stated Goal: to get stronger   Prior Functioning  Vision/Perception  Home Living Lives With: Spouse Available Help at Discharge: Skilled Nursing Facility Type of Home: House Additional Comments: Agreeable to d/c to SNF Prior Function Level of Independence: Independent Communication Communication: No difficulties      Cognition  Overall Cognitive Status: Appears within functional limits for tasks assessed/performed Arousal/Alertness: Awake/alert Orientation Level: Appears intact for tasks assessed Behavior During Session: [facility] for tasks performed    Extremity/Trunk Assessment Right Upper Extremity Assessment RUE ROM/Strength/Tone: Deficits RUE ROM/Strength/Tone Deficits: strength at least 3+-4-/5 but no muscle endurance. UEs give out rapidly with weight bearing.  Left Upper Extremity Assessment LUE ROM/Strength/Tone: Deficits LUE ROM/Strength/Tone Deficits: as above   Mobility Transfers Transfers: Sit to Stand;Stand to Sit Sit to Stand: 3: Mod assist;With upper extremity assist;From chair/3-in-1 Stand to Sit: 3: Mod assist;With upper extremity assist;To chair/3-in-1 Details for Transfer Assistance: verbal cues for THPs and hand placement. Pt fatigues rapidly after sit to stand.   Exercise    Balance    End of Session OT - End of Session Equipment Utilized During Treatment: Gait belt Activity Tolerance: Patient limited by fatigue Patient left: in chair;with call bell/phone within reach  GO     Lennox Laity [phone] 09/27/2011, 10:58 AM

## 2011-09-27 NOTE — Progress Notes (Addendum)
Physical Therapy Treatment Patient Details Name: LATORSHA CURLING MRN: [id number] DOB: 1930/02/19 Today's Date: 09/27/2011 Time: 7829-5621 PT Time Calculation (min): 23 min  PT Assessment / Plan / Recommendation Comments on Treatment Session  Pt reported dizziness earlier this morning with BP supine 139/69 and HR 82 however pt reports no dizziness during tx session.  Pt assisted to [facility] then recliner and present with limited activity tolerance.    Follow Up Recommendations  Skilled nursing facility    Barriers to Discharge        Equipment Recommendations  Defer to next venue    Recommendations for Other Services    Frequency     Plan Discharge plan remains appropriate;Frequency remains appropriate    Precautions / Restrictions Precautions Precautions: Posterior Hip;Fall Precaution Comments: pt only able to state 1/3 precautions, all precautions reviewed Required Braces or Orthoses: Knee Immobilizer - Left Knee Immobilizer - Left: On except when in CPM (on at all times except with PT) Restrictions Weight Bearing Restrictions: No LLE Weight Bearing: Weight bearing as tolerated   Pertinent Vitals/Pain 5/10 L hip pain, premedicated, repositioned, ice applied    Mobility  Bed Mobility Bed Mobility: Sit to Supine Supine to Sit: 4: Min assist;HOB elevated Details for Bed Mobility Assistance: verbal cues for technique, assist for L LE Transfers Transfers: Stand to Sit;Sit to Stand;Stand Pivot Transfers Sit to Stand: 3: Mod assist;With upper extremity assist;From chair/3-in-1;From bed Stand to Sit: 3: Mod assist;With upper extremity assist;To chair/3-in-1 Stand Pivot Transfers: 3: Mod assist Details for Transfer Assistance: verbal cues for safe technique and hip precautions, pt fatigues quickly, assist pt to [facility] then 180 degree turn to sit in recliner    Exercises     PT Diagnosis:    PT Problem List:   PT Treatment Interventions:     PT Goals Acute Rehab PT Goals PT  Goal: Supine/Side to Sit - Progress: Progressing toward goal PT Goal: Sit to Stand - Progress: Progressing toward goal PT Goal: Stand to Sit - Progress: Progressing toward goal  Visit Information  Last PT Received On: 09/27/11 Assistance Needed: +2    Subjective Data  Subjective: That ice just helps so much.   Cognition  Overall Cognitive Status: Appears within functional limits for tasks assessed/performed Arousal/Alertness: Awake/alert Orientation Level: Appears intact for tasks assessed Behavior During Session: [facility] for tasks performed    Balance     End of Session PT - End of Session Equipment Utilized During Treatment: Gait belt Activity Tolerance: Patient limited by fatigue;Patient limited by pain Patient left: in chair;with call bell/phone within reach   GP     Xue Low,KATHrine E 09/27/2011, 11:07 AM Pager: [phone]

## 2011-09-28 ENCOUNTER — Inpatient Hospital Stay (HOSPITAL_COMMUNITY): Payer: Medicare Other

## 2011-09-28 ENCOUNTER — Encounter (HOSPITAL_COMMUNITY): Payer: Self-pay | Admitting: Specialist

## 2011-09-28 LAB — CBC
HCT: 27.8 % — ABNORMAL LOW (ref 36.0–46.0)
Hemoglobin: 8.7 g/dL — ABNORMAL LOW (ref 12.0–15.0)
MCHC: 31.3 g/dL (ref 30.0–36.0)
RBC: 3.49 MIL/uL — ABNORMAL LOW (ref 3.87–5.11)
WBC: 14.5 10*3/uL — ABNORMAL HIGH (ref 4.0–10.5)

## 2011-09-28 LAB — BASIC METABOLIC PANEL
BUN: 21 mg/dL (ref 6–23)
CO2: 27 mEq/L (ref 19–32)
Chloride: 98 mEq/L (ref 96–112)
GFR calc non Af Amer: 63 mL/min — ABNORMAL LOW (ref >90)
Glucose, Bld: 113 mg/dL — ABNORMAL HIGH (ref 70–99)
Potassium: 3.6 mEq/L (ref 3.5–5.1)
Sodium: 134 mEq/L — ABNORMAL LOW (ref 135–145)

## 2011-09-28 LAB — GLUCOSE, CAPILLARY: Glucose-Capillary: 101 mg/dL — ABNORMAL HIGH (ref 70–99)

## 2011-09-28 NOTE — Progress Notes (Signed)
Patient examined and lab reviewed with Dondra Spry.  This patient is stable for discharge to SNF. Should return to [facility] for follow up in 2 weeks.

## 2011-09-28 NOTE — Progress Notes (Signed)
Patient will discharge to [facility] when medically stable, anticipating discharge Friday. Patient, daughter & husband aware. Patient will transport via [facility]. CSW to check back in the morning.   Clinical Social Work Department CLINICAL SOCIAL WORK PLACEMENT NOTE 09/28/2011  Patient:  [name], [name]  Account Number:  [id number] Admit date:  09/22/2011  Clinical Social Worker:  Orpah Greek  Date/time:  09/24/2011 03:19 PM  Clinical Social Work is seeking post-discharge placement for this patient at the following level of care:   SKILLED NURSING   (*CSW will update this form in Epic as items are completed)   09/24/2011  Patient/family provided with [facility] Department of Clinical Social Work's list of facilities offering this level of care within the geographic area requested by the patient (or if unable, by the patient's family).  09/24/2011  Patient/family informed of their freedom to choose among providers that offer the needed level of care, that participate in Medicare, Medicaid or managed care program needed by the patient, have an available bed and are willing to accept the patient.  09/24/2011  Patient/family informed of [facility]' ownership interest in [facility], as well as of the fact that they are under no obligation to receive care at this facility.  PASARR submitted to EDS on 09/24/2011 PASARR number received from EDS on 09/24/2011  FL2 transmitted to all facilities in geographic area requested by pt/family on  09/25/2011 FL2 transmitted to all facilities within larger geographic area on   Patient informed that his/her managed care company has contracts with or will negotiate with  certain facilities, including the following:     Patient/family informed of bed offers received:  09/26/2011 Patient chooses bed at [facility] Physician recommends and patient chooses bed at    Patient to be  transferred to [facility] on  09/29/2011 Patient to be transferred to facility by [facility]  The following physician request were entered in Epic:   Additional Comments:    Unice Bailey, LCSW [facility] Clinical Social Worker cell #: [phone]

## 2011-09-28 NOTE — Progress Notes (Signed)
Subjective: 3 Days Post-Op Procedure(s) (LRB): ARTHROPLASTY BIPOLAR HIP (Left) Patient reports pain as mild.    Objective: Vital signs in last 24 hours: Temp:  [98.3 F (36.8 C)-98.5 F (36.9 C)] 98.5 F (36.9 C) (08/08 0453) Pulse Rate:  [79-94] 94  (08/08 0453) Resp:  [16-18] 16  (08/08 0453) BP: (113-129)/(57-81) 129/81 mmHg (08/08 0453) SpO2:  [90 %-91 %] 90 % (08/08 0453) Weight:  [44.7 kg (98 lb 8.7 oz)] 44.7 kg (98 lb 8.7 oz) (08/08 0500)  Intake/Output from previous day: 08/07 0701 - 08/08 0700 In: 720 [P.O.:720] Out: 450 [Urine:450] Intake/Output this shift: Total I/O In: 240 [P.O.:240] Out: -    Basename 09/28/11 0453 09/27/11 0445 09/26/11 0420 09/25/11 2220  HGB 8.7* 8.7* 10.0* 10.9*    Basename 09/28/11 0453 09/27/11 0445 09/26/11 0420  WBC 14.5* -- 17.7*  RBC 3.49* -- 3.98  HCT 27.8* 27.9* --  PLT 391 -- 361    Basename 09/28/11 0453 09/26/11 0420  NA 134* 137  K 3.6 3.6  CL 98 102  CO2 27 23  BUN 21 21  CREATININE 0.85 0.78  GLUCOSE 113* 94  CALCIUM 8.5 8.1*   No results found for this basename: LABPT:2,INR:2 in the last 72 hours  Neurovascular intact Sensation intact distally Intact pulses distally Dorsiflexion/Plantar flexion intact Incision: no drainage  Assessment/Plan: 3 Days Post-Op Procedure(s) (LRB): ARTHROPLASTY BIPOLAR HIP (Left) Discharge to SNF.  Pt stable for transfer orthopedically Follow up 2 weeks  Blythe Hartshorn M 09/28/2011, 1:05 PM

## 2011-09-28 NOTE — Progress Notes (Signed)
Patient ID: [name], female   DOB: [birth year], 78 y.o.   MRN: [id number]  [facility] PROGRESS NOTE  [name] JXB:[id number] DOB: October 27, 1929 DOA: 09/22/2011 PCP: [name], MD  Brief narrative:  Pt admitted on 08/02 after sustaining fall and now is status post left hip arthroplasty post op day #2. Also status post left thoracentesis (08/04) with total of 580 cc of fluid removed.   Principal Problem:  *Hip fracture  - status post left hip arthroplasty  - pt doing clinically well  - plan to d/c in AM  Active Problems:  Anemia due to chronic illness  - now acute blood loss anemia, post op  - Hg and Hct slightly down from yesterday - CBC in AM  Leukocytosis  - likely secondary to acute problem  - WBC trending down  - will obtain CBC in AM   Acute on chronic systolic CHF (congestive heart failure), NYHA class 3  - clinically stable, stable weight  - continue home dose Lasix   COPD (chronic obstructive pulmonary disease)  - clinically compensated  - pt maintaining oxygen saturations > 93%   Failure to thrive in adult  - BMI < 18  - encouraged PO intake   CKD, stage II  - based on GFR  - creatinine is stable and within normal limits   Atria Fibrillation  - currently in normal sinus rhythm  - rate controlled  - continue Amiodarone   Left sided pleural effusion  - status post left side thoracentesis (08/04)  - pt clinically doing well   Consultants:  Orthopedics   Procedures/Studies:  Dg Pelvis Portable  09/25/2011  IMPRESSION:  Expected appearance status post left hip arthroplasty.   Antibiotics:  None  Code Status: Full  Family Communication: Pt at bedside  Disposition Plan: SNF tomorrow  HPI/Subjective: No events overnight.   Objective: Filed Vitals:   09/27/11 2101 09/28/11 0453 09/28/11 0500 09/28/11 1449  BP: 118/70 129/81  124/72  Pulse: 85 94  89  Temp: 98.4 F (36.9 C) 98.5 F (36.9 C)  98.5 F (36.9  C)  TempSrc: Oral Oral  Oral  Resp: 18 16  18   Height:      Weight:   98 lb 8.7 oz (44.7 kg)   SpO2: 91% 90%  95%    Intake/Output Summary (Last 24 hours) at 09/28/11 1616 Last data filed at 09/28/11 1308  Gross per 24 hour  Intake    600 ml  Output    250 ml  Net    350 ml    Exam:   General:  Pt is alert, follows commands appropriately, not in acute distress  Cardiovascular: Regular rate and rhythm, S1/S2, no murmurs, no rubs, no gallops  Respiratory: Clear to auscultation bilaterally, no wheezing, no crackles, no rhonchi  Abdomen: Soft, non tender, non distended, bowel sounds present, no guarding  Extremities: No edema, pulses DP and PT palpable bilaterally  Neuro: Grossly nonfocal  Data Reviewed: Basic Metabolic Panel:  Lab 09/28/11 1308 09/26/11 0420 09/25/11 0410 09/24/11 0515 09/23/11 0446 09/22/11 1852  NA 134* 137 137 137 138 --  K 3.6 3.6 3.8 4.2 3.3* --  CL 98 102 100 99 100 --  CO2 27 23 27  32 28 --  GLUCOSE 113* 94 107* 99 161* --  BUN 21 21 26* 25* 25* --  CREATININE 0.85 0.78 0.95 1.12* 0.84 --  CALCIUM 8.5 8.1* 8.9 9.3 9.1 --  MG -- -- -- 2.1 -- 2.1  PHOS -- -- -- -- -- 4.3   Liver Function Tests:  Lab 09/23/11 0446 09/22/11 1852  AST 20 22  ALT 19 18  ALKPHOS 87 88  BILITOT 0.3 0.4  PROT 6.8 6.8  ALBUMIN 2.7* 2.8*   CBC:  Lab 09/28/11 0453 09/27/11 0445 09/26/11 0420 09/25/11 2220 09/25/11 0410 09/24/11 0515 09/22/11 1852  WBC 14.5* -- 17.7* 21.8* 17.2* 19.0* --  NEUTROABS -- -- -- -- -- -- 18.6*  HGB 8.7* 8.7* 10.0* 10.9* 10.5* -- --  HCT 27.8* 27.9* 32.5* 34.9* 34.2* -- --  MCV 79.7 -- 81.7 80.2 80.5 80.7 --  PLT 391 -- 361 329 383 392 --   CBG:  Lab 09/28/11 0718 09/27/11 0733 09/26/11 0814 09/25/11 0508 09/23/11 0741  GLUCAP 101* 127* 84 115* 89    Recent Results (from the past 240 hour(s))  URINE CULTURE     Status: Normal   Collection Time   09/22/11  6:33 PM      Component Value Range Status Comment   Specimen  Description URINE, CATHETERIZED   Final    Special Requests NONE   Final    Culture  Setup Time 09/22/2011 23:16   Final    Colony Count NO GROWTH   Final    Culture NO GROWTH   Final    Report Status 09/23/2011 FINAL   Final   CULTURE, BLOOD (ROUTINE X 2)     Status: Normal   Collection Time   09/22/11 11:00 PM      Component Value Range Status Comment   Specimen Description BLOOD LEFT ARM   Final    Special Requests BOTTLES DRAWN AEROBIC AND ANAEROBIC   Final    Culture  Setup Time 09/23/2011 03:38   Final    Culture     Final    Value: STAPHYLOCOCCUS SPECIES (COAGULASE NEGATIVE)     Note: THE SIGNIFICANCE OF ISOLATING THIS ORGANISM FROM A SINGLE SET OF BLOOD CULTURES WHEN MULTIPLE SETS ARE DRAWN IS UNCERTAIN. PLEASE NOTIFY THE MICROBIOLOGY DEPARTMENT WITHIN ONE WEEK IF SPECIATION AND SENSITIVITIES ARE REQUIRED.     Note: Gram Stain Report Called to,Read Back By and Verified With: Francesco Sor RN on 09/24/11 at 01:53 by Christie Nottingham   Report Status 09/25/2011 FINAL   Final   CULTURE, BLOOD (ROUTINE X 2)     Status: Normal (Preliminary result)   Collection Time   09/23/11  4:46 AM      Component Value Range Status Comment   Specimen Description BLOOD LEFT ARM   Final    Special Requests BOTTLES DRAWN AEROBIC AND ANAEROBIC 2.5 CC EACH   Final    Culture  Setup Time 09/23/2011 11:13   Final    Culture     Final    Value:        BLOOD CULTURE RECEIVED NO GROWTH TO DATE CULTURE WILL BE HELD FOR 5 DAYS BEFORE ISSUING A FINAL NEGATIVE REPORT   Report Status PENDING   Incomplete   MRSA PCR SCREENING     Status: Normal   Collection Time   09/23/11  7:28 AM      Component Value Range Status Comment   MRSA by PCR NEGATIVE  NEGATIVE Final   BODY FLUID CULTURE     Status: Normal   Collection Time   09/24/11 11:08 AM      Component Value Range Status Comment   Specimen Description PLEURAL   Final    Special Requests Normal  Final    Gram Stain     Final    Value: RARE WBC PRESENT,BOTH PMN AND  MONONUCLEAR     NO ORGANISMS SEEN   Culture NO GROWTH 3 DAYS   Final    Report Status 09/27/2011 FINAL   Final      Scheduled Meds:   . amiodarone  200 mg Oral Daily  . aspirin  325 mg Oral Daily  . atorvastatin  40 mg Oral q1800  . chlorhexidine  60 mL Topical Once  . docusate sodium  100 mg Oral BID  . enoxaparin (LOVENOX) injection  30 mg Subcutaneous Q24H  . ferrous sulfate  325 mg Oral Q breakfast  . furosemide  40 mg Oral Daily  . loratadine  10 mg Oral Daily  . metoprolol succinate  50 mg Oral Daily  . pantoprazole  40 mg Oral Q1200  . potassium chloride  20 mEq Oral Daily  . sodium chloride  3 mL Intravenous Q12H   Continuous Infusions:    Debbora Presto, MD  [facility] Pager [phone]  If 7PM-7AM, please contact night-coverage [email or web address] Password [email or web address] 09/28/2011, 4:16 PM   LOS: 6 days

## 2011-09-28 NOTE — Progress Notes (Signed)
Physical Therapy Treatment Patient Details Name: [name] MRN: [id number] DOB: 04-Jan-1930 Today's Date: 09/28/2011 Time: 4098-1191 PT Time Calculation (min): 18 min  PT Assessment / Plan / Recommendation Comments on Treatment Session  Pt able to ambulate 10 feet this morning.  Pt with possible d/c to SNF today.    Follow Up Recommendations  Skilled nursing facility    Barriers to Discharge        Equipment Recommendations  Defer to next venue    Recommendations for Other Services    Frequency     Plan Discharge plan remains appropriate;Frequency remains appropriate    Precautions / Restrictions Precautions Precautions: Posterior Hip;Fall Precaution Comments: pt only able to state 2/3 precautions, all precautions reviewed Required Braces or Orthoses: Knee Immobilizer - Left Knee Immobilizer - Left: On except when in CPM (on at all times except with PT) Restrictions Weight Bearing Restrictions: Yes LLE Weight Bearing: Weight bearing as tolerated   Pertinent Vitals/Pain 4/10 L hip pain, premedicated, repositioned, ice applied    Mobility  Bed Mobility Bed Mobility: Sit to Supine Supine to Sit: 5: Supervision Details for Bed Mobility Assistance: verbal cues for technique using sheet to assist L LE over EOB Transfers Transfers: Sit to Stand;Stand to Sit Sit to Stand: 3: Mod assist;With upper extremity assist;From bed Stand to Sit: 3: Mod assist;With upper extremity assist;To chair/3-in-1 Details for Transfer Assistance: verbal cues for safe technique and hip precautions, pt fatigues quickly, +2 for safety Ambulation/Gait Ambulation/Gait Assistance: 1: +2 Total assist Ambulation/Gait: Patient Percentage: 60% Ambulation Distance (Feet): 10 Feet Assistive device: Rolling walker Ambulation/Gait Assistance Details: continues to need max verbal cues for sequence, safe RW distance, and posture, pt fatigues quickly Gait Pattern: Step-to pattern;Decreased stance time -  left;Trunk flexed Gait velocity: very slow    Exercises     PT Diagnosis:    PT Problem List:   PT Treatment Interventions:     PT Goals Acute Rehab PT Goals PT Goal: Supine/Side to Sit - Progress: Partly met PT Goal: Sit to Stand - Progress: Progressing toward goal PT Goal: Stand to Sit - Progress: Progressing toward goal PT Goal: Ambulate - Progress: Progressing toward goal  Visit Information  Last PT Received On: 09/28/11 Assistance Needed: +2    Subjective Data  Subjective: you all are so kind.   Cognition  Overall Cognitive Status: Appears within functional limits for tasks assessed/performed    Balance     End of Session PT - End of Session Equipment Utilized During Treatment: Gait belt Activity Tolerance: Patient limited by fatigue;Patient limited by pain Patient left: in chair;with call bell/phone within reach   GP     [name],KATHrine E 09/28/2011, 11:48 AM Pager: [phone]

## 2011-09-29 ENCOUNTER — Telehealth: Payer: Self-pay | Admitting: Internal Medicine

## 2011-09-29 LAB — CBC
Platelets: 384 10*3/uL (ref 150–400)
RBC: 3.38 MIL/uL — ABNORMAL LOW (ref 3.87–5.11)
RDW: 17.3 % — ABNORMAL HIGH (ref 11.5–15.5)
WBC: 10.8 10*3/uL — ABNORMAL HIGH (ref 4.0–10.5)

## 2011-09-29 LAB — BASIC METABOLIC PANEL
CO2: 27 mEq/L (ref 19–32)
Chloride: 100 mEq/L (ref 96–112)
GFR calc Af Amer: >90 mL/min (ref >90)
Potassium: 3.4 mEq/L — ABNORMAL LOW (ref 3.5–5.1)

## 2011-09-29 LAB — CULTURE, BLOOD (ROUTINE X 2)

## 2011-09-29 LAB — GLUCOSE, CAPILLARY: Glucose-Capillary: 85 mg/dL (ref 70–99)

## 2011-09-29 MED ORDER — POTASSIUM CHLORIDE CRYS ER 20 MEQ PO TBCR
40.0000 meq | EXTENDED_RELEASE_TABLET | Freq: Once | ORAL | Status: AC
  Administered 2011-09-29: 40 meq via ORAL
  Filled 2011-09-29: qty 2

## 2011-09-29 MED ORDER — HYDROCODONE-ACETAMINOPHEN 5-325 MG PO TABS: 1.0000 | ORAL_TABLET | Freq: Four times a day (QID) | ORAL | Status: AC | PRN

## 2011-09-29 NOTE — Progress Notes (Signed)
Patient's QTC 0.50 this night shift. NP on call aware. Patient alert and resting quietly. No orders given. Of note patient has EKGs in her chart that confirms a history of QTC >0.50. Will continue to monitor patient.

## 2011-09-29 NOTE — Discharge Summary (Signed)
Physician Discharge Summary  [name] NWG:[id number] DOB: [birth year] DOA: 09/22/2011  PCP: [name], MD  Admit date: 09/22/2011 Discharge date: 09/29/2011  Recommendations for Outpatient Follow-up:  1. Pt will need to follow up with PCP in 1-2 weeks post discharge 2. Please obtain BMP to evaluate electrolytes and kidney function, also check potassium level, please note that potassium upon discharge was 3.4 and patient was given one dose of K. Dur 40 mEq by mouth 3. Please also check CBC to evaluate Hg and Hct levels, hemoglobin levels noted to be around 8-9 postoperatively 4. Patient will be discharged to [facility] for [facility]  Discharge Diagnoses: Hip fracture in the setting of mechanical fall  Principal Problem:  *Hip fracture Active Problems:  Anemia due to chronic illness  Leukocytosis  Acute on chronic systolic CHF (congestive heart failure), NYHA class 3  COPD (chronic obstructive pulmonary disease)  Failure to thrive in adult  Preop cardiovascular exam  Discharge Condition: Stable  Diet recommendation: Heart healthy diet discussed in details   Brief narrative:  Pt admitted on 08/02 after sustaining fall and now is status post left hip arthroplasty post op day #2. Also status post left thoracentesis (08/04) with total of 580 cc of fluid removed.   Principal Problem:  *Hip fracture  - status post left hip arthroplasty, post operative day 4  - pt doing clinically well and will continue physical therapy at [facility]  Active Problems:  Anemia due to chronic illness  - now acute blood loss anemia, post op  - Hg and Hct slightly down from yesterday but appears stable between 8 - 9 - CBC will have to be rechecked in an outpatient setting to ensure that it stays at patient's baseline - Patient has not required blood transfusion during this hospitalization  Leukocytosis  - likely secondary to acute problem    - WBC trending down and is within normal limits on the day of discharge - Patient has not required antibiotics during this hospital stay  Acute on chronic systolic CHF (congestive heart failure), NYHA class 3  - clinically stable, stable weight  - continued home dose Lasix   COPD (chronic obstructive pulmonary disease)  - clinically compensated  - pt maintaining oxygen saturations > 93% during hospital stay  Failure to thrive in adult  - BMI < 18  - encouraged PO intake   CKD, stage II  - based on GFR  - creatinine is stable and within normal limits   Atria Fibrillation  - currently in normal sinus rhythm  - rate controlled  - continue Amiodarone   Left sided pleural effusion  - status post left side thoracentesis (08/04)  - pt clinically doing well   Consultants:  Orthopedics   Procedures/Studies:  Dg Pelvis Portable  09/25/2011  IMPRESSION:  Expected appearance status post left hip arthroplasty.   Antibiotics:  None  Code Status: Full  Family Communication: Pt at bedside  Disposition Plan: SNF today   Discharge Exam: Filed Vitals:   09/29/11 0531  BP: 112/74  Pulse: 80  Temp: 98.1 F (36.7 C)  Resp: 16   Filed Vitals:   09/28/11 1449 09/28/11 2129 09/29/11 0500 09/29/11 0531  BP: 124/72 113/69  112/74  Pulse: 89 81  80  Temp: 98.5 F (36.9 C) 97.9 F (36.6 C)  98.1 F (36.7 C)  TempSrc: Oral Oral  Oral  Resp: 18 16  16   Height:      Weight:  44.5 kg (98 lb 1.7 oz)   SpO2: 95% 91%  90%    General: Pt is alert, follows commands appropriately, not in acute distress Cardiovascular: Regular rate and rhythm, S1/S2 +, SEM 2/6, no rubs, no gallops Respiratory: Clear to auscultation bilaterally, no wheezing, no crackles, no rhonchi Abdominal: Soft, non tender, non distended, bowel sounds +, no guarding Extremities: no edema, no cyanosis, pulses palpable bilaterally DP and PT Neuro: Grossly nonfocal  Discharge Instructions  Discharge Orders     Future Appointments: Provider: Department: Dept Phone: Center:   02/27/2012 2:00 PM [facility] [phone] [facility]   02/27/2012 2:30 PM Larina Earthly, MD [facility] [phone] [facility]   05/03/2012 10:00 AM [facility] [phone] [facility]   05/03/2012 11:00 AM Evern Bio, NP [facility] [phone] [facility]     Future Orders Please Complete By Expires   Diet - low sodium heart healthy      Increase activity slowly        Medication List  As of 09/29/2011  9:49 AM   TAKE these medications         albuterol 108 (90 BASE) MCG/ACT inhaler   Commonly known as: PROVENTIL HFA;VENTOLIN HFA   Inhale 2 puffs into the lungs every 6 (six) hours as needed for wheezing.      amiodarone 200 MG tablet   Commonly known as: PACERONE   Take 1 tablet (200 mg total) by mouth 2 (two) times daily.      aspirin 325 MG EC tablet   Take 325 mg by mouth daily.      atorvastatin 80 MG tablet   Commonly known as: LIPITOR   Take 0.5 tablets (40 mg total) by mouth daily at 6 PM.      CALCIUM 600 + D PO   Take 1 tablet by mouth daily.      cetirizine 10 MG tablet   Commonly known as: ZYRTEC   Take 10 mg by mouth daily.      diltiazem 30 MG tablet   Commonly known as: CARDIZEM   Take 1 tablet (30 mg total) by mouth 3 (three) times daily.      ferrous sulfate 325 (65 FE) MG tablet   Take 325 mg by mouth daily with breakfast.      furosemide 20 MG tablet   Commonly known as: LASIX   Take 2 tablets (40 mg total) by mouth daily.      HYDROcodone-acetaminophen 5-325 MG per tablet   Commonly known as: NORCO/VICODIN   Take 1-2 tablets by mouth every 6 (six) hours as needed.      ICAPS PO   Take 2 capsules by mouth daily.      KRILL OIL PO   Take 1 capsule by mouth daily.      metoprolol succinate 50 MG 24 hr tablet   Commonly known as: TOPROL-XL   Take 50 mg by mouth daily. Take with or immediately following a meal.      omeprazole 20 MG capsule   Commonly known as:  PRILOSEC   Take 20 mg by mouth daily.      potassium chloride SA 20 MEQ tablet   Commonly known as: K-DUR,KLOR-CON   Take 1 tablet (20 mEq total) by mouth daily.      vitamin C 1000 MG tablet   Take 1,000 mg by mouth daily.           Follow-up Information    Follow up with [facility]  STEWART, MD. Schedule an appointment as soon as possible for a visit in 4 days.   Contact information:   [address] [place] [address] [phone]       Follow up with [facility], MD. Schedule an appointment as soon as possible for a visit in 1 week. (follow for Fluid in the lung-pleural effusion)    Contact information:   [address] [place] [address] [phone]       Follow up with NITKA,JAMES E, MD. Schedule an appointment as soon as possible for a visit in 2 weeks.   Contact information:   [employer] [address] [place] [address] [phone]           The results of significant diagnostics from this hospitalization (including imaging, microbiology, ancillary and laboratory) are listed below for reference.     Microbiology: Recent Results (from the past 240 hour(s))  URINE CULTURE     Status: Normal   Collection Time   09/22/11  6:33 PM      Component Value Range Status Comment   Specimen Description URINE, CATHETERIZED   Final    Special Requests NONE   Final    Culture  Setup Time 09/22/2011 23:16   Final    Colony Count NO GROWTH   Final    Culture NO GROWTH   Final    Report Status 09/23/2011 FINAL   Final   CULTURE, BLOOD (ROUTINE X 2)     Status: Normal   Collection Time   09/22/11 11:00 PM      Component Value Range Status Comment   Specimen Description BLOOD LEFT ARM   Final    Special Requests BOTTLES DRAWN AEROBIC AND ANAEROBIC   Final    Culture  Setup Time 09/23/2011 03:38   Final    Culture     Final    Value: STAPHYLOCOCCUS SPECIES (COAGULASE NEGATIVE)      Note: THE SIGNIFICANCE OF ISOLATING THIS ORGANISM FROM A SINGLE SET OF BLOOD CULTURES WHEN MULTIPLE SETS ARE DRAWN IS UNCERTAIN. PLEASE NOTIFY THE MICROBIOLOGY DEPARTMENT WITHIN ONE WEEK IF SPECIATION AND SENSITIVITIES ARE REQUIRED.     Note: Gram Stain Report Called to,Read Back By and Verified With: Francesco Sor RN on 09/24/11 at 01:53 by Christie Nottingham   Report Status 09/25/2011 FINAL   Final   CULTURE, BLOOD (ROUTINE X 2)     Status: Normal   Collection Time   09/23/11  4:46 AM      Component Value Range Status Comment   Specimen Description BLOOD LEFT ARM   Final    Special Requests BOTTLES DRAWN AEROBIC AND ANAEROBIC 2.5 CC EACH   Final    Culture  Setup Time 09/23/2011 11:13   Final    Culture NO GROWTH 5 DAYS   Final    Report Status 09/29/2011 FINAL   Final   MRSA PCR SCREENING     Status: Normal   Collection Time   09/23/11  7:28 AM      Component Value Range Status Comment   MRSA by PCR NEGATIVE  NEGATIVE Final   BODY FLUID CULTURE     Status: Normal   Collection Time   09/24/11 11:08 AM      Component Value Range Status Comment   Specimen Description PLEURAL   Final    Special Requests Normal   Final    Gram Stain     Final  Value: RARE WBC PRESENT,BOTH PMN AND MONONUCLEAR     NO ORGANISMS SEEN   Culture NO GROWTH 3 DAYS   Final    Report Status 09/27/2011 FINAL   Final      Labs: Basic Metabolic Panel:  Lab 09/29/11 4098 09/28/11 1191 09/26/11 0420 09/25/11 0410 09/24/11 0515 09/22/11 1852  NA 135 134* 137 137 137 --  K 3.4* 3.6 3.6 3.8 4.2 --  CL 100 98 102 100 99 --  CO2 27 27 23 27  32 --  GLUCOSE 88 113* 94 107* 99 --  BUN 19 21 21  26* 25* --  CREATININE 0.74 0.85 0.78 0.95 1.12* --  CALCIUM 8.3* 8.5 8.1* 8.9 9.3 --  MG -- -- -- -- 2.1 2.1  PHOS -- -- -- -- -- 4.3   Liver Function Tests:  Lab 09/23/11 0446 09/22/11 1852  AST 20 22  ALT 19 18  ALKPHOS 87 88  BILITOT 0.3 0.4  PROT 6.8 6.8  ALBUMIN 2.7* 2.8*   CBC:  Lab 09/29/11 0423 09/28/11 0453  09/27/11 0445 09/26/11 0420 09/25/11 2220 09/25/11 0410 09/22/11 1852  WBC 10.8* 14.5* -- 17.7* 21.8* 17.2* --  NEUTROABS -- -- -- -- -- -- 18.6*  HGB 8.3* 8.7* 8.7* 10.0* 10.9* -- --  HCT 26.7* 27.8* 27.9* 32.5* 34.9* -- --  MCV 79.0 79.7 -- 81.7 80.2 80.5 --  PLT 384 391 -- 361 329 383 --   BNP: BNP (last 3 results)  Basename 07/19/11 0309 07/18/11 0135 07/09/11 0634  PROBNP 26629.0* 12510.0* 10583.0*   CBG:  Lab 09/29/11 0716 09/28/11 0718 09/27/11 0733 09/26/11 0814 09/25/11 0508  GLUCAP 85 101* 127* 84 115*     SIGNED: Time coordinating discharge: Over 30 minutes  Debbora Presto, MD  [facility] 09/29/2011, 9:49 AM Pager [phone]  If 7PM-7AM, please contact night-coverage [email or web address] Password [email or web address]

## 2011-09-29 NOTE — Telephone Encounter (Signed)
Called [facility], spoke with Sam who reported that pt arrived at their facility this afternoon and received report from the nurse (was d/c'd from hosp today) that pt had a thoracentesis on 09-24-11 and is supposed to follow up w/ MR within a week.  MR not in office next week.  TP not in office until Thurs and then with no openings.  HFU added to MW's schedule for 8.15.13 @ 3:15.  Sam okay with this date and time.

## 2011-09-29 NOTE — Progress Notes (Signed)
Physical Therapy Treatment Patient Details Name: JAQUELINE UBER MRN: [id number] DOB: 1929-06-21 Today's Date: 09/29/2011 Time: 4098-1191 PT Time Calculation (min): 19 min  PT Assessment / Plan / Recommendation Comments on Treatment Session  Pt able to ambulate 15 feet this visit with minimal assist.  Pt continues to have upper body fatigue quickly.    Follow Up Recommendations  Skilled nursing facility    Barriers to Discharge        Equipment Recommendations  Defer to next venue    Recommendations for Other Services    Frequency     Plan Discharge plan remains appropriate;Frequency remains appropriate    Precautions / Restrictions Precautions Precautions: Posterior Hip;Fall Restrictions LLE Weight Bearing: Weight bearing as tolerated   Pertinent Vitals/Pain No pain, ice applied to hip    Mobility  Bed Mobility Bed Mobility: Supine to Sit Supine to Sit: 5: Supervision Details for Bed Mobility Assistance: verbal cue for hip precautions, used sheet to assist L LE Transfers Transfers: Sit to Stand;Stand to Sit Sit to Stand: 4: Min assist;With armrests;From bed Stand to Sit: 4: Min assist;With upper extremity assist;To chair/3-in-1 Details for Transfer Assistance: verbal cues for safe technique and hip precautions, pt fatigues quickly Ambulation/Gait Ambulation/Gait Assistance: 4: Min assist Ambulation Distance (Feet): 15 Feet Assistive device: Rolling walker Ambulation/Gait Assistance Details: continues to need max verbal cues for sequence, safe RW distance, and posture, pt fatigues quickly Gait Pattern: Step-to pattern;Decreased stance time - left;Trunk flexed Gait velocity: very slow    Exercises Total Joint Exercises Ankle Circles/Pumps: AROM;Both;20 reps Quad Sets: AROM;Strengthening;Both;20 reps Heel Slides: AAROM;Strengthening;Left;20 reps Hip ABduction/ADduction: AROM;Strengthening;Left;20 reps   PT Diagnosis:    PT Problem List:   PT Treatment  Interventions:     PT Goals Acute Rehab PT Goals PT Goal: Supine/Side to Sit - Progress: Met PT Goal: Sit to Stand - Progress: Progressing toward goal PT Goal: Stand to Sit - Progress: Progressing toward goal PT Goal: Ambulate - Progress: Progressing toward goal  Visit Information  Last PT Received On: 09/29/11 Assistance Needed: +1    Subjective Data  Subjective: my arms are done.  (fatigue with ambulation)   Cognition  Overall Cognitive Status: Appears within functional limits for tasks assessed/performed Behavior During Session: [facility] for tasks performed    Balance     End of Session PT - End of Session Equipment Utilized During Treatment: Gait belt Activity Tolerance: Patient limited by fatigue Patient left: in chair;with call bell/phone within reach   GP     Elison Worrel,KATHrine E 09/29/2011, 11:06 AM Pager: [phone]

## 2011-09-29 NOTE — Progress Notes (Signed)
Pt to be d/c today to [facility].  Pt and family agreeable. Confirmed plans with facility.  Plan transfer via EMS.   Leron Croak, LCSWA [employer] [phone]

## 2011-09-29 NOTE — Progress Notes (Signed)
Occupational Therapy Treatment Patient Details Name: [name] MRN: [id number] DOB: [birth year] Today's Date: 09/29/2011 Time: 4098-1191 OT Time Calculation (min): 31 min  OT Assessment / Plan / Recommendation Comments on Treatment Session      Follow Up Recommendations  Skilled nursing facility    Barriers to Discharge       Equipment Recommendations       Recommendations for Other Services    Frequency Min 2X/week   Plan Discharge plan remains appropriate    Precautions / Restrictions Precautions Precautions: Posterior Hip;Fall Restrictions LLE Weight Bearing: Weight bearing as tolerated   Pertinent Vitals/Pain l hip sore; repositioned and ice replaced    ADL  Lower Body Bathing: Simulated;Moderate assistance (with long sponge) Where Assessed - Lower Body Bathing: Supported sit to stand Lower Body Dressing: Performed;Maximal assistance (socks and underwear with AE) Where Assessed - Lower Body Dressing: Supported sit to stand Toilet Transfer: Performed;Minimal Dentist Method: Surveyor, minerals: Materials engineer and Hygiene: Performed;Moderate assistance Where Assessed - Toileting Clothing Manipulation and Hygiene: Standing ADL Comments: needs additional practice with AE:  used long sponge, reacher, and sock aid. Reinforced thp's    OT Diagnosis:    OT Problem List:   OT Treatment Interventions:     OT Goals Acute Rehab OT Goals Time For Goal Achievement: 10/04/11 ADL Goals Pt Will Perform Lower Body Bathing: with mod assist;Sit to stand from chair;Sit to stand from bed;with adaptive equipment ADL Goal: Lower Body Bathing - Progress: Met Pt Will Perform Lower Body Dressing: with mod assist;Sit to stand from chair;Sit to stand from bed;with adaptive equipment ADL Goal: Lower Body Dressing - Progress: Progressing toward goals Pt Will Transfer to Toilet: with min assist;Stand pivot  transfer;3-in-1;with DME ADL Goal: Toilet Transfer - Progress: Met Pt Will Perform Toileting - Clothing Manipulation: with mod assist;Standing ADL Goal: Toileting - Clothing Manipulation - Progress: Met  Visit Information  Last OT Received On: 09/29/11 Assistance Needed: +1    Subjective Data      Prior Functioning       Cognition  Overall Cognitive Status: Appears within functional limits for tasks assessed/performed Behavior During Session: [facility] for tasks performed    Mobility Transfers Sit to Stand: 4: Min assist;From chair/3-in-1;With armrests   Exercises    Balance    End of Session OT - End of Session Equipment Utilized During Treatment: Gait belt Activity Tolerance: Patient tolerated treatment well Patient left: in chair;with call bell/phone within reach  GO     Cosmo Tetreault 09/29/2011, 10:00 AM Marica Otter, OTR/L [phone] 09/29/2011

## 2011-10-05 ENCOUNTER — Ambulatory Visit
Admission: RE | Admit: 2011-10-05 | Discharge: 2011-10-05 | Disposition: A | Discharge status: Home / Self Care | Payer: Medicare Other | Source: Ambulatory Visit | Attending: Internal Medicine | Admitting: Internal Medicine

## 2011-10-05 ENCOUNTER — Other Ambulatory Visit: Payer: Medicare Other

## 2011-10-05 ENCOUNTER — Encounter: Payer: Self-pay | Admitting: Internal Medicine

## 2011-10-05 ENCOUNTER — Ambulatory Visit (INDEPENDENT_AMBULATORY_CARE_PROVIDER_SITE_OTHER): Payer: Medicare Other | Admitting: Internal Medicine

## 2011-10-05 VITALS — HR 65 | Temp 97.9°F | Ht 64.0 in | Wt 93.8 lb

## 2011-10-05 DIAGNOSIS — R0600 Dyspnea, unspecified: Secondary | ICD-10-CM

## 2011-10-05 DIAGNOSIS — R0609 Other forms of dyspnea: Secondary | ICD-10-CM

## 2011-10-05 DIAGNOSIS — J90 Pleural effusion, not elsewhere classified: Secondary | ICD-10-CM

## 2011-10-05 DIAGNOSIS — J9610 Chronic respiratory failure, unspecified whether with hypoxia or hypercapnia: Secondary | ICD-10-CM | POA: Insufficient documentation

## 2011-10-05 LAB — CBC WITH DIFFERENTIAL/PLATELET
Basophils Relative: 0.3 % (ref 0.0–3.0)
Eosinophils Relative: 0.9 % (ref 0.0–5.0)
HCT: 33.6 % — ABNORMAL LOW (ref 36.0–46.0)
Hemoglobin: 10.5 g/dL — ABNORMAL LOW (ref 12.0–15.0)
Lymphs Abs: 0.8 10*3/uL (ref 0.7–4.0)
Monocytes Relative: 4.6 % (ref 3.0–12.0)
Neutro Abs: 15 10*3/uL — ABNORMAL HIGH (ref 1.4–7.7)
Platelets: 555 10*3/uL — ABNORMAL HIGH (ref 150.0–400.0)
RBC: 4.14 Mil/uL (ref 3.87–5.11)
WBC: 16.9 10*3/uL — ABNORMAL HIGH (ref 4.5–10.5)

## 2011-10-05 LAB — BASIC METABOLIC PANEL
CO2: 29 mEq/L (ref 19–32)
Chloride: 102 mEq/L (ref 96–112)
Potassium: 4.1 mEq/L (ref 3.5–5.1)
Sodium: 140 mEq/L (ref 135–145)

## 2011-10-05 LAB — BRAIN NATRIURETIC PEPTIDE: Pro B Natriuretic peptide (BNP): 2191 pg/mL — ABNORMAL HIGH (ref 0.0–100.0)

## 2011-10-05 NOTE — Progress Notes (Signed)
Subjective:     Patient ID: [name], female   DOB: March 11, 1929  MRN: [id number]  HPI  66 yowf quit smoking around 2000 and no breathing problems at baseline   Admit date: 09/22/2011  Discharge date: 09/29/2011  Recommendations for Outpatient Follow-up:  1. Pt will need to follow up with PCP in 1-2 weeks post discharge 2. Please obtain BMP to evaluate electrolytes and kidney function, also check potassium level, please note that potassium upon discharge was 3.4 and patient was given one dose of K. Dur 40 mEq by mouth 3. Please also check CBC to evaluate Hg and Hct levels, hemoglobin levels noted to be around 8-9 postoperatively 4. Patient will be discharged to [facility] for further rehabilitation Discharge Diagnoses: Hip fracture in the setting of mechanical fall  Principal Problem:  *Hip fracture  Active Problems:  Anemia due to chronic illness  Leukocytosis  Acute on chronic systolic CHF (congestive heart failure), NYHA class 3  COPD (chronic obstructive pulmonary disease)  Failure to thrive in adult  Preop cardiovascular exam  Discharge Condition: Stable  Diet recommendation: Heart healthy diet discussed in details  Brief narrative:  Pt admitted on 08/02 after sustaining fall and now is status post left hip arthroplasty post op day #2. Also status post left thoracentesis (08/04) with total of 580 cc of fluid removed.  Principal Problem:  *Hip fracture  - status post left hip arthroplasty, post operative day 4  - pt doing clinically well and will continue physical therapy at [facility]  Active Problems:  Anemia due to chronic illness  - now acute blood loss anemia, post op  - Hg and Hct slightly down from yesterday but appears stable between 8 - 9  - CBC will have to be rechecked in an outpatient setting to ensure that it stays at patient's baseline  - Patient has not required blood transfusion during this hospitalization  Leukocytosis  -  likely secondary to acute problem  - WBC trending down and is within normal limits on the day of discharge  - Patient has not required antibiotics during this hospital stay  Acute on chronic systolic CHF (congestive heart failure), NYHA class 3  - clinically stable, stable weight  - continued home dose Lasix  COPD (chronic obstructive pulmonary disease)  - clinically compensated  - pt maintaining oxygen saturations > 93% during hospital stay  Failure to thrive in adult  - BMI < 18  - encouraged PO intake  CKD, stage II  - based on GFR  - creatinine is stable and within normal limits  Atria Fibrillation  - currently in normal sinus rhythm  - rate controlled  - continue Amiodarone  Left sided pleural effusion  - status post left side thoracentesis (08/04)  - pt clinically doing well  Consultants:  Orthopedics  Procedures/Studies:  Dg Pelvis Portable  09/25/2011  IMPRESSION:  Expected appearance status post left hip arthroplasty.  Antibiotics:  None   10/05/2011 1st pulmonary ov reports doe chronically x one aisle at [employer] no 02, no inhalers x at night at baseline,  Worse sob x 48 h so  02 restarted 10/05/2011 with reported orthopnea/ desat in NH and feeling some better on 02 -  No unusual cough, purulent sputum or sinus/hb symptoms on present rx. No pleuritic cp or hemoptysis or significant leg swelling- some better p saba    Also denies any obvious fluctuation of symptoms with weather or environmental changes or other aggravating or  alleviating factors except as outlined above   ROS  The following are not active complaints unless bolded sore throat, dysphagia, dental problems, itching, sneezing,  nasal congestion or excess/ purulent secretions, ear ache,   fever, chills, sweats, unintended wt loss, pleuritic or exertional cp, hemoptysis,  orthopnea pnd or leg swelling, presyncope, palpitations, heartburn, abdominal pain, anorexia, nausea, vomiting, diarrhea  or change in  bowel or urinary habits, change in stools or urine, dysuria,hematuria,  rash, arthralgias, visual complaints, headache, numbness weakness or ataxia or problems with walking or coordination,  change in mood/affect or memory.          Review of Systems     Objective:   Physical Exam Wt Readings from Last 3 Encounters:  10/05/11 93 lb 12.8 oz (42.547 kg)  09/29/11 98 lb 1.7 oz (44.5 kg)  09/29/11 98 lb 1.7 oz (44.5 kg)    HEENT mild turbinate edema.  Oropharynx no thrush or excess pnd or cobblestoning.  No JVD or cervical adenopathy. Mild accessory muscle hypertrophy. Trachea midline, nl thryroid. Chest was hyperinflated by percussion with diminished breath sounds and moderate increased exp time without wheeze. Hoover sign positive at mid inspiration. Regular rate and rhythm without murmur gallop or rub or increase P2 or edema.  Abd: no hsm, nl excursion. Ext warm without cyanosis or clubbing.       CXR  10/05/2011 : 1. No clear acute cardiopulmonary findings. 2. Bilateral pleural effusions are similar to prior. 3. Bibasilar interstitial edema versus pulmonary edema also appears unchanged.   Assessment:         Plan:

## 2011-10-05 NOTE — Patient Instructions (Addendum)
Please remember to go to the lab and x-ray department downstairs for your tests - we will call you with the results when they are available.  Wear 02 24 hours per day and [place] the inhaler 2 pffs up to every 4 hours but if condition worsens go to ER immediately    Please schedule a follow up office visit in 1 week , sooner if needed

## 2011-10-06 ENCOUNTER — Telehealth: Payer: Self-pay | Admitting: Internal Medicine

## 2011-10-06 ENCOUNTER — Other Ambulatory Visit: Payer: Medicare Other

## 2011-10-06 DIAGNOSIS — J90 Pleural effusion, not elsewhere classified: Secondary | ICD-10-CM | POA: Insufficient documentation

## 2011-10-06 LAB — TSH: TSH: 2.5 u[IU]/mL (ref 0.35–5.50)

## 2011-10-06 NOTE — Telephone Encounter (Signed)
Discussed with Dewayne Hatch at [facility], she has discussed this with [name] and family > [name] refuses study and understands there is a risk of blood clots but doesn't want to be put through any more testing and is NCB status so will defer the CTa for now

## 2011-10-06 NOTE — Assessment & Plan Note (Addendum)
-   Tap L chest 09/24/11 >> 580 serous, prot 3.4 and ldh 89, wbc 84  With 67% Lymphs, react cytololgy  Fluid analysis reviewed, non -specific but not typical of transudate unless very chronic and more concentrated after diuresis Follow for now

## 2011-10-06 NOTE — Assessment & Plan Note (Signed)
Review of records and cxr does not yield a definite reason she now has resp failure but strongly suspect it's chf related and note we really can't get an accurate BP on her to tritrate meds to reduce preload/ afterload  Her D dimer is also moderately elevated and doesn't rule out occult PE in this setting (doesn't rule it in either!) but she is at risk  Therefore rec proceed with Ctangiogram and even if neg low threshold to admit

## 2011-10-06 NOTE — Progress Notes (Signed)
Quick Note:  Called and spoke with patients daughter, patient unavailable--going for CT-- informed of results and recs as listed below per Dr. Sherene Sires. Daughter verbalized understanding and nothing further needed at this time. ______

## 2011-10-11 ENCOUNTER — Emergency Department (HOSPITAL_COMMUNITY): Payer: Medicare Other

## 2011-10-11 ENCOUNTER — Encounter (HOSPITAL_COMMUNITY): Payer: Self-pay | Admitting: Emergency Medicine

## 2011-10-11 ENCOUNTER — Inpatient Hospital Stay (HOSPITAL_COMMUNITY)
Admission: EM | Admit: 2011-10-11 | Discharge: 2011-10-13 | DRG: 291 | Disposition: A | Discharge status: Skilled Nursing Facility | Payer: Medicare Other | Attending: Internal Medicine | Admitting: Internal Medicine

## 2011-10-11 DIAGNOSIS — E43 Unspecified severe protein-calorie malnutrition: Secondary | ICD-10-CM

## 2011-10-11 DIAGNOSIS — I999 Unspecified disorder of circulatory system: Secondary | ICD-10-CM

## 2011-10-11 DIAGNOSIS — I10 Essential (primary) hypertension: Secondary | ICD-10-CM

## 2011-10-11 DIAGNOSIS — Z01810 Encounter for preprocedural cardiovascular examination: Secondary | ICD-10-CM

## 2011-10-11 DIAGNOSIS — I509 Heart failure, unspecified: Secondary | ICD-10-CM

## 2011-10-11 DIAGNOSIS — D638 Anemia in other chronic diseases classified elsewhere: Secondary | ICD-10-CM

## 2011-10-11 DIAGNOSIS — J9601 Acute respiratory failure with hypoxia: Secondary | ICD-10-CM | POA: Diagnosis present

## 2011-10-11 DIAGNOSIS — J449 Chronic obstructive pulmonary disease, unspecified: Secondary | ICD-10-CM

## 2011-10-11 DIAGNOSIS — J4489 Other specified chronic obstructive pulmonary disease: Secondary | ICD-10-CM | POA: Diagnosis present

## 2011-10-11 DIAGNOSIS — Z66 Do not resuscitate: Secondary | ICD-10-CM | POA: Diagnosis present

## 2011-10-11 DIAGNOSIS — M629 Disorder of muscle, unspecified: Secondary | ICD-10-CM

## 2011-10-11 DIAGNOSIS — R000 Tachycardia, unspecified: Secondary | ICD-10-CM

## 2011-10-11 DIAGNOSIS — I5023 Acute on chronic systolic (congestive) heart failure: Secondary | ICD-10-CM

## 2011-10-11 DIAGNOSIS — S72009A Fracture of unspecified part of neck of unspecified femur, initial encounter for closed fracture: Secondary | ICD-10-CM

## 2011-10-11 DIAGNOSIS — R7989 Other specified abnormal findings of blood chemistry: Secondary | ICD-10-CM

## 2011-10-11 DIAGNOSIS — I70219 Atherosclerosis of native arteries of extremities with intermittent claudication, unspecified extremity: Secondary | ICD-10-CM

## 2011-10-11 DIAGNOSIS — D72829 Elevated white blood cell count, unspecified: Secondary | ICD-10-CM

## 2011-10-11 DIAGNOSIS — J9600 Acute respiratory failure, unspecified whether with hypoxia or hypercapnia: Secondary | ICD-10-CM

## 2011-10-11 DIAGNOSIS — M2420 Disorder of ligament, unspecified site: Secondary | ICD-10-CM

## 2011-10-11 DIAGNOSIS — R0902 Hypoxemia: Secondary | ICD-10-CM

## 2011-10-11 DIAGNOSIS — R627 Adult failure to thrive: Secondary | ICD-10-CM

## 2011-10-11 DIAGNOSIS — I771 Stricture of artery: Secondary | ICD-10-CM

## 2011-10-11 DIAGNOSIS — J90 Pleural effusion, not elsewhere classified: Secondary | ICD-10-CM

## 2011-10-11 DIAGNOSIS — N390 Urinary tract infection, site not specified: Secondary | ICD-10-CM

## 2011-10-11 DIAGNOSIS — Z9981 Dependence on supplemental oxygen: Secondary | ICD-10-CM

## 2011-10-11 DIAGNOSIS — J9620 Acute and chronic respiratory failure, unspecified whether with hypoxia or hypercapnia: Secondary | ICD-10-CM | POA: Diagnosis present

## 2011-10-11 LAB — BASIC METABOLIC PANEL
BUN: 28 mg/dL — ABNORMAL HIGH (ref 6–23)
Chloride: 103 mEq/L (ref 96–112)
GFR calc Af Amer: 68 mL/min — ABNORMAL LOW (ref >90)
Potassium: 3.7 mEq/L (ref 3.5–5.1)

## 2011-10-11 LAB — CBC WITH DIFFERENTIAL/PLATELET
Basophils Relative: 0 % (ref 0–1)
HCT: 34.6 % — ABNORMAL LOW (ref 36.0–46.0)
Hemoglobin: 10.5 g/dL — ABNORMAL LOW (ref 12.0–15.0)
Lymphocytes Relative: 2 % — ABNORMAL LOW (ref 12–46)
Monocytes Relative: 2 % — ABNORMAL LOW (ref 3–12)
Neutro Abs: 17.2 10*3/uL — ABNORMAL HIGH (ref 1.7–7.7)
WBC: 18 10*3/uL — ABNORMAL HIGH (ref 4.0–10.5)

## 2011-10-11 LAB — URINALYSIS, MICROSCOPIC ONLY
Bilirubin Urine: NEGATIVE
Hgb urine dipstick: NEGATIVE
Specific Gravity, Urine: 1.015 (ref 1.005–1.030)
Urobilinogen, UA: 0.2 mg/dL (ref 0.0–1.0)

## 2011-10-11 LAB — MRSA PCR SCREENING: MRSA by PCR: NEGATIVE

## 2011-10-11 LAB — TROPONIN I: Troponin I: <0.3 ng/mL (ref <0.30)

## 2011-10-11 LAB — PROTIME-INR: INR: 1.13 (ref 0.00–1.49)

## 2011-10-11 MED ORDER — SODIUM CHLORIDE 0.9 % IV SOLN
250.0000 mL | INTRAVENOUS | Status: DC | PRN
  Administered 2011-10-11: 500 mL via INTRAVENOUS

## 2011-10-11 MED ORDER — VITAMIN C 500 MG PO TABS
1000.0000 mg | ORAL_TABLET | Freq: Every day | ORAL | Status: DC
  Administered 2011-10-11 – 2011-10-13 (×3): 1000 mg via ORAL
  Filled 2011-10-11 (×3): qty 2

## 2011-10-11 MED ORDER — PANTOPRAZOLE SODIUM 40 MG PO TBEC
40.0000 mg | DELAYED_RELEASE_TABLET | Freq: Every day | ORAL | Status: DC
  Administered 2011-10-11 – 2011-10-12 (×2): 40 mg via ORAL
  Filled 2011-10-11 (×3): qty 1

## 2011-10-11 MED ORDER — ASPIRIN EC 325 MG PO TBEC: 325.0000 mg | DELAYED_RELEASE_TABLET | Freq: Every day | ORAL | Status: DC

## 2011-10-11 MED ORDER — LORATADINE 10 MG PO TABS
10.0000 mg | ORAL_TABLET | Freq: Every day | ORAL | Status: DC
  Administered 2011-10-11 – 2011-10-13 (×3): 10 mg via ORAL
  Filled 2011-10-11 (×3): qty 1

## 2011-10-11 MED ORDER — ACETAMINOPHEN 325 MG PO TABS: 650.0000 mg | ORAL_TABLET | ORAL | Status: DC | PRN

## 2011-10-11 MED ORDER — ENOXAPARIN SODIUM 30 MG/0.3ML ~~LOC~~ SOLN
30.0000 mg | SUBCUTANEOUS | Status: DC
  Administered 2011-10-11 – 2011-10-12 (×2): 30 mg via SUBCUTANEOUS
  Filled 2011-10-11 (×3): qty 0.3

## 2011-10-11 MED ORDER — POTASSIUM CHLORIDE CRYS ER 20 MEQ PO TBCR
20.0000 meq | EXTENDED_RELEASE_TABLET | Freq: Every day | ORAL | Status: DC
  Administered 2011-10-11 – 2011-10-13 (×3): 20 meq via ORAL
  Filled 2011-10-11 (×3): qty 1

## 2011-10-11 MED ORDER — METHYLPREDNISOLONE SODIUM SUCC 125 MG IJ SOLR
125.0000 mg | Freq: Once | INTRAMUSCULAR | Status: AC
  Administered 2011-10-11: 125 mg via INTRAVENOUS
  Filled 2011-10-11: qty 2

## 2011-10-11 MED ORDER — SODIUM CHLORIDE 0.9 % IJ SOLN: 3.0000 mL | INTRAMUSCULAR | Status: DC | PRN

## 2011-10-11 MED ORDER — DILTIAZEM HCL 30 MG PO TABS
30.0000 mg | ORAL_TABLET | Freq: Three times a day (TID) | ORAL | Status: DC
  Administered 2011-10-11 – 2011-10-13 (×6): 30 mg via ORAL
  Filled 2011-10-11 (×8): qty 1

## 2011-10-11 MED ORDER — FUROSEMIDE 10 MG/ML IJ SOLN
40.0000 mg | Freq: Two times a day (BID) | INTRAMUSCULAR | Status: DC
  Administered 2011-10-11 – 2011-10-13 (×4): 40 mg via INTRAVENOUS
  Filled 2011-10-11 (×6): qty 4

## 2011-10-11 MED ORDER — ALBUTEROL SULFATE (5 MG/ML) 0.5% IN NEBU
5.0000 mg | INHALATION_SOLUTION | Freq: Once | RESPIRATORY_TRACT | Status: AC
  Administered 2011-10-11: 5 mg via RESPIRATORY_TRACT
  Filled 2011-10-11: qty 1

## 2011-10-11 MED ORDER — FERROUS SULFATE 325 (65 FE) MG PO TABS
325.0000 mg | ORAL_TABLET | Freq: Every day | ORAL | Status: DC
  Administered 2011-10-12 – 2011-10-13 (×2): 325 mg via ORAL
  Filled 2011-10-11 (×3): qty 1

## 2011-10-11 MED ORDER — ONDANSETRON HCL 4 MG/2ML IJ SOLN: 4.0000 mg | Freq: Four times a day (QID) | INTRAMUSCULAR | Status: DC | PRN

## 2011-10-11 MED ORDER — IPRATROPIUM BROMIDE 0.02 % IN SOLN
0.5000 mg | Freq: Once | RESPIRATORY_TRACT | Status: AC
  Administered 2011-10-11: 0.5 mg via RESPIRATORY_TRACT
  Filled 2011-10-11: qty 2.5

## 2011-10-11 MED ORDER — AMIODARONE HCL 200 MG PO TABS
200.0000 mg | ORAL_TABLET | Freq: Two times a day (BID) | ORAL | Status: DC
  Administered 2011-10-11 – 2011-10-13 (×4): 200 mg via ORAL
  Filled 2011-10-11 (×5): qty 1

## 2011-10-11 MED ORDER — ATORVASTATIN CALCIUM 40 MG PO TABS
40.0000 mg | ORAL_TABLET | Freq: Every evening | ORAL | Status: DC
  Administered 2011-10-11 – 2011-10-12 (×2): 40 mg via ORAL
  Filled 2011-10-11 (×3): qty 1

## 2011-10-11 MED ORDER — NITROGLYCERIN IN D5W 200-5 MCG/ML-% IV SOLN: 10.0000 ug/min | INTRAVENOUS | Status: DC

## 2011-10-11 MED ORDER — SODIUM CHLORIDE 0.9 % IV BOLUS (SEPSIS)
250.0000 mL | Freq: Once | INTRAVENOUS | Status: AC
  Administered 2011-10-11: 250 mL via INTRAVENOUS

## 2011-10-11 MED ORDER — POLYETHYLENE GLYCOL 3350 17 G PO PACK
17.0000 g | PACK | Freq: Every day | ORAL | Status: DC
  Administered 2011-10-11 – 2011-10-13 (×3): 17 g via ORAL
  Filled 2011-10-11 (×3): qty 1

## 2011-10-11 MED ORDER — SODIUM CHLORIDE 0.9 % IJ SOLN
3.0000 mL | Freq: Two times a day (BID) | INTRAMUSCULAR | Status: DC
  Administered 2011-10-12 – 2011-10-13 (×3): 3 mL via INTRAVENOUS

## 2011-10-11 MED ORDER — FUROSEMIDE 10 MG/ML IJ SOLN
40.0000 mg | Freq: Once | INTRAMUSCULAR | Status: AC
  Administered 2011-10-11: 40 mg via INTRAVENOUS
  Filled 2011-10-11: qty 4

## 2011-10-11 MED ORDER — HYDROCODONE-ACETAMINOPHEN 5-325 MG PO TABS
1.0000 | ORAL_TABLET | Freq: Four times a day (QID) | ORAL | Status: DC | PRN
  Administered 2011-10-13: 1 via ORAL
  Filled 2011-10-11 (×2): qty 1

## 2011-10-11 MED ORDER — SODIUM CHLORIDE 0.9 % IV SOLN
Freq: Once | INTRAVENOUS | Status: AC
  Administered 2011-10-11: 250 mL via INTRAVENOUS
  Administered 2011-10-11: 10 mL/h via INTRAVENOUS

## 2011-10-11 MED ORDER — NITROGLYCERIN IN D5W 200-5 MCG/ML-% IV SOLN
10.0000 ug/min | INTRAVENOUS | Status: DC
  Administered 2011-10-11: 10 ug/min via INTRAVENOUS
  Filled 2011-10-11: qty 250

## 2011-10-11 MED ORDER — ZOLPIDEM TARTRATE 5 MG PO TABS
5.0000 mg | ORAL_TABLET | Freq: Every evening | ORAL | Status: DC | PRN
  Administered 2011-10-12: 5 mg via ORAL
  Filled 2011-10-11: qty 1

## 2011-10-11 MED ORDER — CALCIUM CARBONATE ANTACID 500 MG PO CHEW
1.0000 | CHEWABLE_TABLET | Freq: Two times a day (BID) | ORAL | Status: DC | PRN
  Filled 2011-10-11 (×2): qty 1

## 2011-10-11 MED ORDER — ASPIRIN EC 325 MG PO TBEC
325.0000 mg | DELAYED_RELEASE_TABLET | Freq: Every day | ORAL | Status: DC
  Administered 2011-10-11 – 2011-10-13 (×3): 325 mg via ORAL
  Filled 2011-10-11 (×3): qty 1

## 2011-10-11 MED ORDER — BIOTENE DRY MOUTH MT LIQD
15.0000 mL | Freq: Two times a day (BID) | OROMUCOSAL | Status: DC
  Administered 2011-10-11 – 2011-10-13 (×3): 15 mL via OROMUCOSAL

## 2011-10-11 MED ORDER — METOPROLOL SUCCINATE ER 50 MG PO TB24
50.0000 mg | ORAL_TABLET | Freq: Every day | ORAL | Status: DC
  Administered 2011-10-11: 50 mg via ORAL
  Filled 2011-10-11 (×2): qty 1

## 2011-10-11 NOTE — ED Notes (Signed)
Pt paperwork not available, including DNR. Secretary states that papers are being scanned for upload in epic.
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
## 2011-10-11 NOTE — ED Provider Notes (Signed)
Pt seen by attending and resident together at 0745 (pre-orders).  I saw and evaluated the patient, reviewed the resident's note and I agree with the findings and plan.  Pt states she is DNR/DNI refused recent CTA chest, has COPD and CHF on home O2 2L N/C, usually not SOB at baseline, woke today 2 hour SOB slight chest tightness, no fever/cough/AMS/edema, does not weigh self, Pt feels dehydrated with dry mouth, mod severe resp distress, speaks phrases, sat hypoxic 76% on 5L O2, N/C, retractions, accessory muscles, prolonged exp phase with faint wheezing, rales right base, no edema, mouth dry.  1140 Much improved on Bipap; Pt then wanted Bipap removed since no longer tolerated, sats improved on 5L N/C to 89-91% still hypoxic and retracting with tachypnea but speaking full sentences awake and alert, [facility] p[facility]  CRITICAL CARE Performed by: Hurman Horn   Total critical care time:  Critical care time was exclusive of separately billable procedures and treating other patients.  Critical care was necessary to treat or prevent imminent or life-threatening deterioration.  Critical care was time spent personally by me on the following activities: development of treatment plan with patient and/or surrogate as well as nursing, discussions with consultants, evaluation of patient's response to treatment, examination of patient, obtaining history from patient or surrogate, ordering and performing treatments and interventions, ordering and review of laboratory studies, ordering and review of radiographic studies, pulse oximetry and re-evaluation of patient's condition.  Hurman Horn, MD 10/13/11 [phone]

## 2011-10-11 NOTE — Progress Notes (Signed)
[name], is a 78 y.o. female,   MRN: [id number]  -  DOB - Oct 22, 1929  Outpatient Primary MD for the patient is [name], MD  in for    Chief Complaint  Patient presents with  . Shortness of Breath     Blood pressure 109/65, pulse 78, temperature 98.9 F (37.2 C), temperature source Oral, resp. rate 21, SpO2 90.00%.  Principal Problem:  *Acute on chronic systolic CHF (congestive heart failure), NYHA class 3 Active Problems:  COPD (chronic obstructive pulmonary disease)  Acute respiratory failure with hypoxia  Pleural effusion   78 yo DNR/DNI presents with cc SOB.  Hx NYHA class 3 CHF, COPD, Atrial fibrillation, HTN and recent hospitalization for hip replacement presenting with severe shortness of breath.  At baseline she uses 2L oxygen at home. She felt some nausea and chest pressure with the shortness of breath, but did not vomit. No report cough, fever, or chills preceding this episode. Most recent hospitalization it was noted that she has L sided pleural effusion. Per her PCP's note, fluid analysis was non-specific but not typical of transudate unless very chronic and more concentrated after diuresis. Per chart review, it appears that pt had contacted PCP w/in past week for some SOB, and they recommended CTA as pt had elevated d-dimer to evaluate for PE; however, pt refused scan.    Today in ED chest xray Moderate enlargement of the cardiac silhouette which appears larger than on prior study. Diffuse vascular congestion pattern with  reticular interstitial opacities diffusely. Bibasilar atelectasis and pulmonary infiltrative densities. Interval increase in bilateral pleural effusions. Findings most likely reflect volume  overload and / or congestive heart failure and edema superimposed on COPD. Pro BNP 20837, Trop neg. Lactic acid 3.3, WC 18, afebrile. Pt hypoxic in 70's put on Bipap, given lasix, solumedrol, nebs and given IV ntg. Improved resp effort and pt  requested not to be on bipap. At time of exam, on 6L sats 88-90.  Pt feeling better and would like to go. Will admit to SD for 24 hours ensure improvement sustained.

## 2011-10-11 NOTE — ED Notes (Signed)
WGN:[id number]<HY> Expected date:10/11/11<BR> Expected time: 7:24 AM<BR> Means of arrival:Ambulance<BR> Comments:<BR> SOB

## 2011-10-11 NOTE — Progress Notes (Signed)
Pt is very adamant about doing something to make her get better or let her go home and die. I explained that we are diuresing currently to help her breathe better. She stated as long as were doing something then she'll stay.   Pt is very strong willed about missing her physical therapy. Stating she was getting stronger and does not want to relapse. Her current goal was to walk to the bathroom with her walker. Advised I will inform PT so they can continue the pace of her rehabilitation.

## 2011-10-11 NOTE — ED Notes (Signed)
Per EMS-States that pt become severly SOB this am around 6. Hx of COPD, chronic obstructive airway. Sats 76%. 5L

## 2011-10-11 NOTE — Progress Notes (Signed)
Pt usually takes TUMS at night for indigestion, Mylanta does not help. Will call MD for PRN order.

## 2011-10-11 NOTE — ED Notes (Addendum)
Respiratory paged, pt BIPAP tube detatched.

## 2011-10-11 NOTE — ED Notes (Signed)
Report given to [facility] RN
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
## 2011-10-11 NOTE — Progress Notes (Signed)
IV in EPIC dated 8/2 - 8/9 was documented and removed from template. It was not present upon assessment.

## 2011-10-11 NOTE — ED Notes (Signed)
Pt requesting to speak with MD. Donnella Sham that she is concerned about being admitted, dont want to lose spot at rehab clinic. MD notified.

## 2011-10-11 NOTE — ED Notes (Signed)
Respiratory at bedside. Pt on Bipap.

## 2011-10-12 ENCOUNTER — Inpatient Hospital Stay (HOSPITAL_COMMUNITY): Payer: Medicare Other

## 2011-10-12 DIAGNOSIS — J90 Pleural effusion, not elsewhere classified: Secondary | ICD-10-CM

## 2011-10-12 DIAGNOSIS — J449 Chronic obstructive pulmonary disease, unspecified: Secondary | ICD-10-CM

## 2011-10-12 LAB — TROPONIN I
Troponin I: <0.3 ng/mL (ref <0.30)
Troponin I: <0.3 ng/mL (ref <0.30)

## 2011-10-12 LAB — BASIC METABOLIC PANEL
CO2: 28 mEq/L (ref 19–32)
Chloride: 102 mEq/L (ref 96–112)
Creatinine, Ser: 0.83 mg/dL (ref 0.50–1.10)

## 2011-10-12 MED ORDER — METOPROLOL SUCCINATE ER 50 MG PO TB24
50.0000 mg | ORAL_TABLET | Freq: Every day | ORAL | Status: DC
  Administered 2011-10-13: 50 mg via ORAL
  Filled 2011-10-12: qty 1

## 2011-10-12 NOTE — Progress Notes (Signed)
Clinical Social Work Department BRIEF PSYCHOSOCIAL ASSESSMENT 10/12/2011  Patient:  [name], [name]     Account Number:  [id number]     Admit date:  10/11/2011  Clinical Social Worker:  Orpah Greek  Date/Time:  10/12/2011 01:42 PM  Referred by:  Physician  Date Referred:  10/12/2011 Referred for  Other - See comment   Other Referral:   Admitted from: [facility]   Interview type:  Patient Other interview type:   and family    PSYCHOSOCIAL DATA Living Status:  FACILITY Admitted from facility:  [facility] Level of care:  Skilled Nursing Facility Primary support name:  Ether Griffins (daughter) h#: [phone] c#: [phone] Primary support relationship to patient:  CHILD, ADULT Degree of support available:   good    CURRENT CONCERNS Current Concerns  Post-Acute Placement   Other Concerns:    SOCIAL WORK ASSESSMENT / PLAN CSW spoke with patient, her husband & daughter at bedside re: discharge planning. Patient was admitted from [facility] & plans to return there at discharge.   Assessment/plan status:  Information/Referral to [facility] Other assessment/ plan:   Information/referral to community resources:   CSW completed FL2 and faxed information to [facility], confirmed with [facility] @ SNF that patient is ok to return at discharge.    PATIENT'S/FAMILY'S RESPONSE TO PLAN OF CARE: Patient & family have been pleased with the care she's been given at [facility], eager to return there to complete her rehab.        Unice Bailey, LCSW [facility] Clinical Social Worker cell #: [phone]

## 2011-10-12 NOTE — Evaluation (Signed)
Physical Therapy Evaluation Patient Details Name: [name] MRN: [id number] DOB: [birth year] Today's Date: 10/12/2011 Time: 1308-6578 PT Time Calculation (min): 31 min  PT Assessment / Plan / Recommendation Clinical Impression  Pt. was admitted from SNF rehab w/ worsening CHF. Pt is s/p L hip hemiarthroplasty w/ posterior precautions. P tolerated well, very motivated to continue rehab. Pt. will benefit from PT to improve functional mobility and strength to return to SNF rehab.    PT Assessment  Patient needs continued PT services    Follow Up Recommendations  Skilled nursing facility    Barriers to Discharge        Equipment Recommendations  None recommended by PT    Recommendations for Other Services     Frequency Min 5X/week    Precautions / Restrictions Precautions Precautions: Posterior Hip Precaution Comments: required VC to abide by posterior precautions, tends to flex hip >90 while in bed Restrictions Weight Bearing Restrictions: No LLE Weight Bearing: Weight bearing as tolerated   Pertinent Vitals/Pain Pre VS:76 HR 96% 4l 101/53 Post  76 118/74 94% 4 l  No pain.     Mobility  Bed Mobility Bed Mobility: Supine to Sit Supine to Sit: 5: Supervision Details for Bed Mobility Assistance: VC for post. hip precautions Transfers Transfers: Sit to Stand;Stand Pivot Transfers;Stand to Sit Sit to Stand: 4: Min guard;With armrests;From bed;From chair/3-in-1;With upper extremity assist Stand to Sit: To chair/3-in-1;With armrests Stand Pivot Transfers: 4: Min guard Details for Transfer Assistance: VC for push from armrests Ambulation/Gait Ambulation/Gait Assistance: 4: Min guard Ambulation Distance (Feet): 10 Feet Ambulation/Gait Assistance Details: VC to remain holding to RW, wanted to just walk w/o it Gait Pattern: Step-through pattern;Trunk flexed    Exercises     PT Diagnosis: Difficulty walking;Generalized weakness  PT Problem List: Decreased  strength;Decreased mobility;Decreased activity tolerance;Decreased knowledge of use of DME;Decreased safety awareness;Decreased knowledge of precautions;Cardiopulmonary status limiting activity PT Treatment Interventions: Gait training;DME instruction;Functional mobility training;Therapeutic activities;Therapeutic exercise;Patient/family education   PT Goals Acute Rehab PT Goals PT Goal Formulation: With patient/family Time For Goal Achievement: 10/26/11 Potential to Achieve Goals: Good Pt will go Supine/Side to Sit: Independently;with HOB 0 degrees PT Goal: Supine/Side to Sit - Progress: Goal set today Pt will go Sit to Supine/Side: Independently;with HOB 0 degrees PT Goal: Sit to Supine/Side - Progress: Goal set today Pt will go Sit to Stand: with supervision PT Goal: Sit to Stand - Progress: Goal set today Pt will go Stand to Sit: with supervision PT Goal: Stand to Sit - Progress: Goal set today Pt will Ambulate: >150 feet;with supervision;with rolling walker PT Goal: Ambulate - Progress: Goal set today Pt will Perform Home Exercise Program: with supervision, verbal cues required/provided PT Goal: Perform Home Exercise Program - Progress: Goal set today Additional Goals Additional Goal #1: state/demonstrate 3/3 posterior hip precautions PT Goal: Additional Goal #1 - Progress: Goal set today  Visit Information  Last PT Received On: 10/12/11 Assistance Needed: +1 (needs O2)    Subjective Data  Subjective: I know I am not to cross my legs. I have been doing good. Patient Stated Goal: To keep up with therapy.   Prior Functioning  Home Living Available Help at Discharge: Skilled Nursing Facility (for rehab after Hip hemiarthroplasty)    Cognition  Overall Cognitive Status: Appears within functional limits for tasks assessed/performed Arousal/Alertness: Awake/alert Orientation Level: Appears intact for tasks assessed Behavior During Session: [facility] for tasks performed      Extremity/Trunk Assessment Right Upper Extremity Assessment RUE  ROM/Strength/Tone: [facility] for tasks assessed Left Upper Extremity Assessment LUE ROM/Strength/Tone: WFL for tasks assessed Right Lower Extremity Assessment RLE ROM/Strength/Tone: WFL for tasks assessed RLE Sensation: WFL - Light Touch Left Lower Extremity Assessment LLE ROM/Strength/Tone: WFL for tasks assessed LLE ROM/Strength/Tone Deficits: pt is able to move LE to edge LLE Sensation: WFL - Light Touch   Balance    End of Session PT - End of Session Activity Tolerance: Patient tolerated treatment well Patient left: in chair;with call bell/phone within reach;with family/visitor present Nurse Communication: Mobility status;Weight bearing status;Precautions  GP     Rada Hay 10/12/2011, 10:32 AM  [phone]

## 2011-10-12 NOTE — Progress Notes (Signed)
*  PRELIMINARY RESULTS* Echocardiogram 2D Echocardiogram has been performed.  [name] 10/12/2011, 9:01 AM

## 2011-10-12 NOTE — Progress Notes (Signed)
Report given to Merla Riches RN in preparation for tx to [phone] by [facility]

## 2011-10-12 NOTE — Progress Notes (Signed)
[facility] PROGRESS NOTE  [name] AVW:[id number] DOB: 23-Apr-1929 DOA: 10/11/2011 PCP: [name], MD  Assessment/Plan: Principal Problem:  *Acute on chronic systolic CHF (congestive heart failure), NYHA class 3 Active Problems:  COPD (chronic obstructive pulmonary disease)  Acute respiratory failure with hypoxia  Pleural effusion  1. Acute on chronic CHF- echo pending (last one 12/12)- EF 35%, if not much change, plan to D/C back to SNF on increased dose of lasix and frequent BMPs, on BB, no ACE secondary to low BP, has already diuresed 1.4 L-- do not think weight is accurate 2. COPD- nebs 3. Pleural eff- s/p thoracentesis 4. Acute on chronic respiratory failure- on 3L O2 now but usually 2L  Code Status: DNR Family Communication: patient at bedside Disposition Plan: back to rehab soon (today/tomm)  HPI/Subjective: Breathing better- wants to get back to rehab so she does not lose any of her strength No SOB, no CP On 2L of O2 at home No fever, no chills  Objective: Filed Vitals:   10/12/11 0530 10/12/11 0600 10/12/11 0630 10/12/11 0700  BP: 96/51 113/48 117/54 118/61  Pulse: 67 69 69 72  Temp:      TempSrc:      Resp: 16 18 21 20   Height:      Weight:      SpO2: 100% 100% 98% 97%    Intake/Output Summary (Last 24 hours) at 10/12/11 0758 Last data filed at 10/12/11 0600  Gross per 24 hour  Intake    134 ml  Output   1605 ml  Net  -1471 ml   Filed Weights   10/11/11 1500 10/12/11 0000  Weight: 41.6 kg (91 lb 11.4 oz) 43.1 kg (95 lb 0.3 oz)    Exam:   General:  A+Ox3- NAD, no increased work of breathing  Cardiovascular: rrr  Respiratory: decreased B/L, no wheezing  Abdomen: +BS, soft, NT/ND  Skin: no rashes or lesions, no edema  Data Reviewed: Basic Metabolic Panel:  Lab 10/12/11 8295 10/11/11 1000 10/05/11 1614  NA 140 141 140  K 3.7 3.7 4.1  CL 102 103 102  CO2 28 27 29   GLUCOSE 143* 141* 103*  BUN 26* 28* 20    CREATININE 0.83 0.91 1.0  CALCIUM 8.2* 9.6 9.3  MG -- -- --  PHOS -- -- --   Liver Function Tests: No results found for this basename: AST:5,ALT:5,ALKPHOS:5,BILITOT:5,PROT:5,ALBUMIN:5 in the last 168 hours No results found for this basename: LIPASE:5,AMYLASE:5 in the last 168 hours No results found for this basename: AMMONIA:5 in the last 168 hours CBC:  Lab 10/11/11 1000 10/05/11 1614  WBC 18.0* 16.9 Repeated and verified X2.*  NEUTROABS 17.2* 15.0*  HGB 10.5* 10.5*  HCT 34.6* 33.6*  MCV 85.2 81.2  PLT 457* 555.0*   Cardiac Enzymes:  Lab 10/12/11 0638 10/11/11 2343 10/11/11 1000  CKTOTAL -- -- --  CKMB -- -- --  CKMBINDEX -- -- --  TROPONINI <0.30 <0.30 <0.30   BNP (last 3 results)  Basename 10/11/11 1000 10/05/11 1614 07/19/11 0309  PROBNP 62130.8* 2191.0* 26629.0*   CBG: No results found for this basename: GLUCAP:5 in the last 168 hours  Recent Results (from the past 240 hour(s))  MRSA PCR SCREENING     Status: Normal   Collection Time   10/11/11  5:54 PM      Component Value Range Status Comment   MRSA by PCR NEGATIVE  NEGATIVE Final      Studies: Dg Chest 1 View  09/24/2011  *  RADIOLOGY REPORT*  Clinical Data: Status post thoracentesis  CHEST - 1 VIEW  Comparison: 09/24/2011  Findings: Heart size appears enlarged.  This is unchanged from previous exam.  There has been interval decrease in volume of left effusion.  No pneumothorax identified.  Pulmonary edema pattern has also improved from previous exam.  Chronic interstitial coarsening is again noted.  IMPRESSION:  1.  No pneumothorax after left sided thoracentesis.  Original Report Authenticated By: Rosealee Albee, M.D.   Dg Chest 1 View  09/22/2011  *RADIOLOGY REPORT*  Clinical Data: 78 year old female with left hip fracture - preoperative respiratory examination.  CHEST - 1 VIEW  Comparison: 08/29/2011 and prior chest radiographs  Findings: A moderate to large left pleural effusion is again noted with  increasing left lower lung atelectasis. COPD/emphysema is again identified with increasing pulmonary vascular congestion. Mild right basilar opacity is noted - suspect atelectasis. There is no evidence of pneumothorax. No acute bony abnormalities are present. Surgical clips in the right axillary region are again noted.  IMPRESSION: Unchanged moderate to large left pleural effusion with increasing left lower lung atelectasis.  Pulmonary vascular congestion.  Mild right basilar opacity - suspect atelectasis.  Original Report Authenticated By: Rosendo Gros, M.D.   Dg Chest 2 View  10/05/2011  *RADIOLOGY REPORT*  Clinical Data: Pleural effusion  CHEST - 2 VIEW  Comparison: Chest radiograph 09/28/2011  Findings: Normal cardiac silhouette.  There are bilateral pleural effusions which are unchanged  in volume compared to prior.  There is bibasilar air space disease suggesting edema which is also unchanged.  Lungs are hyperinflated.  There is a compression fracture of the mid thoracic spine which appears chronic.  IMPRESSION:  1.  No clear acute cardiopulmonary findings. 2.  Bilateral pleural effusions are similar to prior. 3.  Bibasilar interstitial edema versus pulmonary edema also appears unchanged.  Original Report Authenticated By: Genevive Bi, M.D.   Dg Hip Complete Left  09/22/2011  *RADIOLOGY REPORT*  Clinical Data: Fall.  LEFT HIP - COMPLETE 2+ VIEW  Comparison: None  Findings: The bones are diffusely osteopenic.  There is an acute subcapital femoral neck fracture involving the proximal left femur. There is mild superior and lateral displacement of the distal fracture fragments.  Metallic stents are noted within the iliac vessels.  IMPRESSION:  1.  Acute left-sided subcapital femoral neck fracture.  Original Report Authenticated By: Rosealee Albee, M.D.   Dg Pelvis Portable  09/25/2011  *RADIOLOGY REPORT*  Clinical Data: Postop left hip surgery, left femoral neck fracture  PORTABLE PELVIS  Comparison:  09/22/2011  Findings: Left hip arthroplasty noted.  Components appear aligned with the native acetabulum in the frontal plane.  Bones are osteopenic.  Peripheral vascular calcifications noted.  Expected postoperative appearance.  Pelvic iliac vascular stents present.  IMPRESSION: Expected appearance status post left hip arthroplasty.  Original Report Authenticated By: Judie Petit. Ruel Favors, M.D.   Dg Chest Port 1 View  10/12/2011  *RADIOLOGY REPORT*  Clinical Data: Shortness of breath.  Bibasilar atelectasis.  PORTABLE CHEST - 1 VIEW  Comparison: 10/11/2011  Findings: Cardiac enlargement with pulmonary vascular congestion. Diffuse interstitial edema.  Bilateral pleural effusions with basilar atelectasis.  No pneumothorax.  No significant change since yesterday.  IMPRESSION: Cardiac enlargement with pulmonary vascular congestion and diffuse interstitial edema.  Bilateral pleural effusions with basilar atelectasis.   Original Report Authenticated By: Marlon Pel, M.D.    Dg Chest Port 1 View  10/11/2011  *RADIOLOGY REPORT*  Clinical Data: History  of shortness of breath.  History of hypertension, COPD, congestive heart failure, and atrial fibrillation.  PORTABLE CHEST - 1 VIEW  Comparison: 10/05/2011.  Findings: There is moderate enlargement of the cardiac silhouette. Ectasia and nonaneurysmal calcification of the thoracic aorta are seen.  There is diffuse vascular congestion pattern.  Reticular interstitial opacities are seen within the lungs.  Bibasilar atelectasis, pulmonary infiltrates, and bilateral pleural effusions are present.  The pleural effusion appears increased on the left compared to prior study.  There is also increase in the amount of right pleural effusion with associated atelectasis and infiltrative density.  Surgical clips are seen in the right axillary region. There is osteopenic appearance of bones.  There is degenerative spondylosis.  IMPRESSION: Moderate enlargement of the cardiac  silhouette which appears larger than on prior study.  Diffuse vascular congestion pattern with reticular interstitial opacities diffusely.  Bibasilar atelectasis and pulmonary infiltrative densities.  Interval increase in bilateral pleural effusions.  Findings most likely reflect volume overload and / or congestive heart failure and edema superimposed on COPD.   Original Report Authenticated By: Crawford Givens, M.D.    Dg Chest Port 1 View  09/28/2011  *RADIOLOGY REPORT*  Clinical Data: Pleural effusion.  PORTABLE CHEST - 1 VIEW  Comparison: 09/24/2011  Findings: Left lower lobe airspace disease again noted, unchanged. Small left pleural effusion.  Diffuse interstitial lung disease and mild cardiomegaly.  No real change.  IMPRESSION: No significant interval change.  Original Report Authenticated By: Cyndie Chime, M.D.   Dg Chest Port 1 View  09/24/2011  *RADIOLOGY REPORT*  Clinical Data: Cough and increased shortness of breath.  PORTABLE CHEST - 1 VIEW  Comparison: Chest x-ray 09/22/2011.  Findings: There is cephalization of the pulmonary vasculature, indistinctness of the interstitial markings, and patchy airspace disease throughout the lungs bilaterally suggestive of moderate pulmonary edema.  Persistent retrocardiac opacity may represent atelectasis and/or consolidation, with superimposed moderate left- sided pleural effusion.  Small right pleural effusion is similar to the prior examinations.  Mild cardiomegaly. The patient is rotated to the left on today's exam, resulting in distortion of the mediastinal contours and reduced diagnostic sensitivity and specificity for mediastinal pathology.  Atherosclerosis of the thoracic aorta.  IMPRESSION: 1.  Significantly worsened aeration throughout the lungs bilaterally, suggestive of interval development of moderate pulmonary edema. 2.  Small right and moderate left pleural effusions are unchanged. 3.  Persistent dense opacification in the retrocardiac region on the  left may represent atelectasis and/or consolidation. 4.  Mild cardiomegaly is unchanged. 5.  Atherosclerosis.  Original Report Authenticated By: Florencia Reasons, M.D.   US Thoracentesis Asp Pleural Space W/img Guide  09/24/2011  *RADIOLOGY REPORT*  Clinical Data:  Left sided pleural effusion with leukocytosis. Request has been made for therapeutic and diagnostic thoracentesis prior to hip surgery.  ULTRASOUND GUIDED left THORACENTESIS  Comparison:  Chest x-ray performed on 09/22/2011.  An ultrasound guided thoracentesis was thoroughly discussed with the patient and questions answered.  The benefits, risks, alternatives and complications were also discussed.  The patient understands and wishes to proceed with the procedure.  Written consent was obtained.  Ultrasound was performed to localize and mark an adequate pocket of fluid in the left chest.  The area was then prepped and draped in the normal sterile fashion.  1% Lidocaine was used for local anesthesia.  Under ultrasound guidance a 19 gauge Yueh catheter was introduced.  Thoracentesis was performed.  The catheter was removed and a dressing applied.  Complications:  None immediate  Findings: A total of approximately 580 ml of yellow serous fluid was removed. A fluid sample was sent for laboratory analysis.  IMPRESSION: Successful ultrasound guided left thoracentesis yielding 580 ml of pleural fluid.  Read by: Anselm Pancoast, P.A.-C  Original Report Authenticated By: Thora Lance III, M.D.    Scheduled Meds:   . sodium chloride   Intravenous Once  . albuterol  5 mg Nebulization Once  . amiodarone  200 mg Oral BID  . antiseptic oral rinse  15 mL Mouth Rinse BID  . aspirin EC  325 mg Oral Daily  . atorvastatin  40 mg Oral QPM  . diltiazem  30 mg Oral TID  . enoxaparin  30 mg Subcutaneous Q24H  . ferrous sulfate  325 mg Oral Q breakfast  . furosemide  40 mg Intravenous Once  . furosemide  40 mg Intravenous Q12H  . ipratropium  0.5 mg  Nebulization Once  . loratadine  10 mg Oral Daily  . methylPREDNISolone (SOLU-MEDROL) injection  125 mg Intravenous Once  . metoprolol succinate  50 mg Oral Daily  . pantoprazole  40 mg Oral Q1200  . polyethylene glycol  17 g Oral Daily  . potassium chloride SA  20 mEq Oral Daily  . sodium chloride  250 mL Intravenous Once  . sodium chloride  3 mL Intravenous Q12H  . vitamin C  1,000 mg Oral Daily  . DISCONTD: aspirin  325 mg Oral Daily   Continuous Infusions:   . nitroGLYCERIN    . DISCONTD: nitroGLYCERIN Stopped (10/11/11 1610)    Principal Problem:  *Acute on chronic systolic CHF (congestive heart failure), NYHA class 3 Active Problems:  COPD (chronic obstructive pulmonary disease)  Acute respiratory failure with hypoxia  Pleural effusion    Time spent:    Marlin Canary  [facility] Pager [phone] 10/12/2011, 7:58 AM  LOS: 1 day

## 2011-10-13 ENCOUNTER — Ambulatory Visit: Payer: Medicare Other | Admitting: Adult Health

## 2011-10-13 DIAGNOSIS — R0902 Hypoxemia: Secondary | ICD-10-CM

## 2011-10-13 LAB — BASIC METABOLIC PANEL
BUN: 33 mg/dL — ABNORMAL HIGH (ref 6–23)
Chloride: 100 mEq/L (ref 96–112)
GFR calc Af Amer: 52 mL/min — ABNORMAL LOW (ref >90)
Potassium: 3.5 mEq/L (ref 3.5–5.1)

## 2011-10-13 MED ORDER — POTASSIUM CHLORIDE CRYS ER 20 MEQ PO TBCR
40.0000 meq | EXTENDED_RELEASE_TABLET | Freq: Once | ORAL | Status: AC
  Administered 2011-10-13: 40 meq via ORAL
  Filled 2011-10-13: qty 2

## 2011-10-13 MED ORDER — FUROSEMIDE 40 MG PO TABS
40.0000 mg | ORAL_TABLET | Freq: Two times a day (BID) | ORAL | Status: DC
  Administered 2011-10-13: 40 mg via ORAL
  Filled 2011-10-13 (×3): qty 1

## 2011-10-13 MED ORDER — BIOTENE DRY MOUTH MT LIQD: 15.0000 mL | Freq: Two times a day (BID) | OROMUCOSAL | Status: DC

## 2011-10-13 MED ORDER — HYDROCODONE-ACETAMINOPHEN 5-325 MG PO TABS: 1.0000 | ORAL_TABLET | Freq: Four times a day (QID) | ORAL | Status: AC | PRN

## 2011-10-13 NOTE — Progress Notes (Signed)
Patient is set to discharge back to [facility] today. Patient, husband & daughter at bedside and aware. [facility] called for 12:30 pickup.   Unice Bailey, LCSW [facility] Clinical Social Worker cell #: [phone]

## 2011-10-13 NOTE — Discharge Summary (Addendum)
Physician Discharge Summary  [name] QMV:[id number] DOB: 05-24-1929 DOA: 10/11/2011  PCP: [name], MD  Admit date: 10/11/2011 Discharge date: 10/13/2011  Recommendations for Outpatient Follow-up:  1. Weigh daily- dry weight 88 lbs (see heart failure directions) 2. BMP on Monday re Cr and K+  Discharge Diagnoses:  Principal Problem:  *Acute on chronic systolic CHF (congestive heart failure), NYHA class 3 Active Problems:  COPD (chronic obstructive pulmonary disease)  Acute respiratory failure with hypoxia  Pleural effusion   Discharge Condition: improved  Diet recommendation: cardiac  Filed Weights   10/11/11 1500 10/12/11 0000 10/13/11 0627  Weight: 41.6 kg (91 lb 11.4 oz) 43.1 kg (95 lb 0.3 oz) 40.1 kg (88 lb 6.5 oz)    History of present illness:  This is a 78 year old white female with several recent hospitalizations most recently being 8/228/9 for hip fracture. Patient also also hospitalized on 5/28 to 6/5. During the hospitalization she had an acute on chronic CHF as well as A. fib with RVR. Patient was not started on ACE inhibitor that time due to low blood pressure. Patient also Dr. Chales Abrahams for cardiology. This hospitalization she developed shortness of breath, at baseline she uses 2 L of O2. She did develop some nausea and chest pain this a.m. but she attributes that to receiving medications without breakfast. Don't have his recent hospitalization she did have a left-sided pleural effusion. Thoracentesis was done fluid analysis is nonspecific. Also doing chart review it found that patient's had shortness of breath and contacted her PCP in the last week. He recommended CTA the patient refused.  In the ER today patient was found to have an increased BNP, chest x-ray showing vascular congestion pattern. Patient was initially placed on BiPAP she was hypoxic in the 70s. She was given Lasix and steroids and nebulizers. Patient was improved enough to be removed  from the BiPAP.  Patient currently has minimal SOB, she does desat after talking for a while.   Hospital Course:  1. *Acute on chronic CHF- IV lasix changed to PO lasix on 8/23, down 2.8L, on BB, if BP stays elevated, could start an ACE inhibitor at next visit with cardiology 2. COPD- nebs, O2- wean back to home dose 3. Pleural eff- s/p thoracentesis on previous hospitalization 4. Acute on chronic respiratory failure- on 3L O2 now but usually 2L 5. Leukocytosis- stable, no fever, no diarrhea   Consultations:  none  Discharge Exam: Filed Vitals:   10/13/11 0627  BP: 143/73  Pulse: 78  Temp: 97.5 F (36.4 C)  Resp: 20   Filed Vitals:   10/12/11 1000 10/12/11 1223 10/12/11 2127 10/13/11 0627  BP: 96/57 120/65 143/77 143/73  Pulse: 140 82 74 78  Temp:  98.1 F (36.7 C) 97.9 F (36.6 C) 97.5 F (36.4 C)  TempSrc:  Oral Oral Oral  Resp: 20 20 20 20   Height:      Weight:    40.1 kg (88 lb 6.5 oz)  SpO2: 95% 95% 94% 94%    General: A+Ox3, NAD Cardiovascular: rrr Respiratory: clear anterior Ext: -c/c/e   Discharge Instructions  Discharge Orders    Future Appointments: Provider: Department: Dept Phone: Center:   02/27/2012 2:00 PM [facility] [phone] [facility]   02/27/2012 2:30 PM Larina Earthly, MD [facility] [phone] [facility]   05/03/2012 10:00 AM [facility] [phone] [facility]   05/03/2012 11:00 AM Evern Bio, NP [facility] [phone] [facility]     Future Orders Please Complete By  Expires   Diet - low sodium heart healthy      Increase activity slowly      Heart Failure patients record your daily weight using the same scale at the same time of day      STOP any activity that causes chest pain, shortness of breath, dizziness, sweating, or exessive weakness      Discharge instructions      Comments:   BMP on Monday- for Cr   (HEART FAILURE PATIENTS) Call MD:  Anytime you have any of the following symptoms: 1) 3 pound weight gain in  24 hours or 5 pounds in 1 week 2) shortness of breath, with or without a dry hacking cough 3) swelling in the hands, feet or stomach 4) if you have to sleep on extra pillows at night in order to breathe.      Beta Blocker already ordered      Contraindication to ACEI at discharge        Medication List  As of 10/13/2011  8:03 AM   STOP taking these medications         furosemide 20 MG tablet         TAKE these medications         albuterol 108 (90 BASE) MCG/ACT inhaler   Commonly known as: PROVENTIL HFA;VENTOLIN HFA   Inhale 2 puffs into the lungs every 6 (six) hours as needed. Wheezing      amiodarone 200 MG tablet   Commonly known as: PACERONE   Take 200 mg by mouth 2 (two) times daily.      antiseptic oral rinse Liqd   15 mLs by Mouth Rinse route 2 (two) times daily.      aspirin 325 MG EC tablet   Take 325 mg by mouth daily.      atorvastatin 80 MG tablet   Commonly known as: LIPITOR   Take 40 mg by mouth every evening. Take 1/2 tablet (40 mg total) daily      CALCIUM 600 + D PO   Take 1 tablet by mouth daily.      cetirizine 10 MG tablet   Commonly known as: ZYRTEC   Take 10 mg by mouth daily.      diltiazem 30 MG tablet   Commonly known as: CARDIZEM   Take 30 mg by mouth 3 (three) times daily.      ferrous sulfate 325 (65 FE) MG tablet   Take 325 mg by mouth daily with breakfast.      furosemide 80 MG tablet   Commonly known as: LASIX   Take 40-80 mg by mouth every other day. Alternate 40 mg with 80 mg every other day. 40 mg last dose was on 10/08/2011 and 80 mg was on 10/09/2011      HYDROcodone-acetaminophen 5-325 MG per tablet   Commonly known as: NORCO/VICODIN   Take 1 tablet by mouth every 6 (six) hours as needed.      ICAPS Caps   Take 2 capsules by mouth daily.      KRILL OIL PO   Take 1 capsule by mouth daily.      metoprolol succinate 50 MG 24 hr tablet   Commonly known as: TOPROL-XL   Take 50 mg by mouth daily. Take with or immediately  following a meal.      MIRALAX PO   Take by mouth daily.      omeprazole 20 MG capsule   Commonly known as: PRILOSEC  Take 20 mg by mouth daily.      potassium chloride SA 20 MEQ tablet   Commonly known as: K-DUR,KLOR-CON   Take 20 mEq by mouth daily.      vitamin C 1000 MG tablet   Take 1,000 mg by mouth daily.      zolpidem 5 MG tablet   Commonly known as: AMBIEN   Take 5 mg by mouth at bedtime as needed. sleep              The results of significant diagnostics from this hospitalization (including imaging, microbiology, ancillary and laboratory) are listed below for reference.    Significant Diagnostic Studies: Dg Chest 1 View  09/24/2011  *RADIOLOGY REPORT*  Clinical Data: Status post thoracentesis  CHEST - 1 VIEW  Comparison: 09/24/2011  Findings: Heart size appears enlarged.  This is unchanged from previous exam.  There has been interval decrease in volume of left effusion.  No pneumothorax identified.  Pulmonary edema pattern has also improved from previous exam.  Chronic interstitial coarsening is again noted.  IMPRESSION:  1.  No pneumothorax after left sided thoracentesis.  Original Report Authenticated By: Rosealee Albee, M.D.   Dg Chest 1 View  09/22/2011  *RADIOLOGY REPORT*  Clinical Data: 78 year old female with left hip fracture - preoperative respiratory examination.  CHEST - 1 VIEW  Comparison: 08/29/2011 and prior chest radiographs  Findings: A moderate to large left pleural effusion is again noted with increasing left lower lung atelectasis. COPD/emphysema is again identified with increasing pulmonary vascular congestion. Mild right basilar opacity is noted - suspect atelectasis. There is no evidence of pneumothorax. No acute bony abnormalities are present. Surgical clips in the right axillary region are again noted.  IMPRESSION: Unchanged moderate to large left pleural effusion with increasing left lower lung atelectasis.  Pulmonary vascular congestion.  Mild  right basilar opacity - suspect atelectasis.  Original Report Authenticated By: Rosendo Gros, M.D.   Dg Chest 2 View  10/05/2011  *RADIOLOGY REPORT*  Clinical Data: Pleural effusion  CHEST - 2 VIEW  Comparison: Chest radiograph 09/28/2011  Findings: Normal cardiac silhouette.  There are bilateral pleural effusions which are unchanged  in volume compared to prior.  There is bibasilar air space disease suggesting edema which is also unchanged.  Lungs are hyperinflated.  There is a compression fracture of the mid thoracic spine which appears chronic.  IMPRESSION:  1.  No clear acute cardiopulmonary findings. 2.  Bilateral pleural effusions are similar to prior. 3.  Bibasilar interstitial edema versus pulmonary edema also appears unchanged.  Original Report Authenticated By: Genevive Bi, M.D.   Dg Hip Complete Left  09/22/2011  *RADIOLOGY REPORT*  Clinical Data: Fall.  LEFT HIP - COMPLETE 2+ VIEW  Comparison: None  Findings: The bones are diffusely osteopenic.  There is an acute subcapital femoral neck fracture involving the proximal left femur. There is mild superior and lateral displacement of the distal fracture fragments.  Metallic stents are noted within the iliac vessels.  IMPRESSION:  1.  Acute left-sided subcapital femoral neck fracture.  Original Report Authenticated By: Rosealee Albee, M.D.   Dg Pelvis Portable  09/25/2011  *RADIOLOGY REPORT*  Clinical Data: Postop left hip surgery, left femoral neck fracture  PORTABLE PELVIS  Comparison: 09/22/2011  Findings: Left hip arthroplasty noted.  Components appear aligned with the native acetabulum in the frontal plane.  Bones are osteopenic.  Peripheral vascular calcifications noted.  Expected postoperative appearance.  Pelvic iliac vascular stents present.  IMPRESSION: Expected appearance status post left hip arthroplasty.  Original Report Authenticated By: Judie Petit. Ruel Favors, M.D.   Dg Chest Port 1 View  10/12/2011  *RADIOLOGY REPORT*  Clinical Data:  Shortness of breath.  Bibasilar atelectasis.  PORTABLE CHEST - 1 VIEW  Comparison: 10/11/2011  Findings: Cardiac enlargement with pulmonary vascular congestion. Diffuse interstitial edema.  Bilateral pleural effusions with basilar atelectasis.  No pneumothorax.  No significant change since yesterday.  IMPRESSION: Cardiac enlargement with pulmonary vascular congestion and diffuse interstitial edema.  Bilateral pleural effusions with basilar atelectasis.   Original Report Authenticated By: Marlon Pel, M.D.    Dg Chest Port 1 View  10/11/2011  *RADIOLOGY REPORT*  Clinical Data: History of shortness of breath.  History of hypertension, COPD, congestive heart failure, and atrial fibrillation.  PORTABLE CHEST - 1 VIEW  Comparison: 10/05/2011.  Findings: There is moderate enlargement of the cardiac silhouette. Ectasia and nonaneurysmal calcification of the thoracic aorta are seen.  There is diffuse vascular congestion pattern.  Reticular interstitial opacities are seen within the lungs.  Bibasilar atelectasis, pulmonary infiltrates, and bilateral pleural effusions are present.  The pleural effusion appears increased on the left compared to prior study.  There is also increase in the amount of right pleural effusion with associated atelectasis and infiltrative density.  Surgical clips are seen in the right axillary region. There is osteopenic appearance of bones.  There is degenerative spondylosis.  IMPRESSION: Moderate enlargement of the cardiac silhouette which appears larger than on prior study.  Diffuse vascular congestion pattern with reticular interstitial opacities diffusely.  Bibasilar atelectasis and pulmonary infiltrative densities.  Interval increase in bilateral pleural effusions.  Findings most likely reflect volume overload and / or congestive heart failure and edema superimposed on COPD.   Original Report Authenticated By: Crawford Givens, M.D.    Dg Chest Port 1 View  09/28/2011  *RADIOLOGY REPORT*   Clinical Data: Pleural effusion.  PORTABLE CHEST - 1 VIEW  Comparison: 09/24/2011  Findings: Left lower lobe airspace disease again noted, unchanged. Small left pleural effusion.  Diffuse interstitial lung disease and mild cardiomegaly.  No real change.  IMPRESSION: No significant interval change.  Original Report Authenticated By: Cyndie Chime, M.D.   Dg Chest Port 1 View  09/24/2011  *RADIOLOGY REPORT*  Clinical Data: Cough and increased shortness of breath.  PORTABLE CHEST - 1 VIEW  Comparison: Chest x-ray 09/22/2011.  Findings: There is cephalization of the pulmonary vasculature, indistinctness of the interstitial markings, and patchy airspace disease throughout the lungs bilaterally suggestive of moderate pulmonary edema.  Persistent retrocardiac opacity may represent atelectasis and/or consolidation, with superimposed moderate left- sided pleural effusion.  Small right pleural effusion is similar to the prior examinations.  Mild cardiomegaly. The patient is rotated to the left on today's exam, resulting in distortion of the mediastinal contours and reduced diagnostic sensitivity and specificity for mediastinal pathology.  Atherosclerosis of the thoracic aorta.  IMPRESSION: 1.  Significantly worsened aeration throughout the lungs bilaterally, suggestive of interval development of moderate pulmonary edema. 2.  Small right and moderate left pleural effusions are unchanged. 3.  Persistent dense opacification in the retrocardiac region on the left may represent atelectasis and/or consolidation. 4.  Mild cardiomegaly is unchanged. 5.  Atherosclerosis.  Original Report Authenticated By: Florencia Reasons, M.D.   US Thoracentesis Asp Pleural Space W/img Guide  09/24/2011  *RADIOLOGY REPORT*  Clinical Data:  Left sided pleural effusion with leukocytosis. Request has been made for therapeutic and diagnostic thoracentesis prior to  hip surgery.  ULTRASOUND GUIDED left THORACENTESIS  Comparison:  Chest x-ray  performed on 09/22/2011.  An ultrasound guided thoracentesis was thoroughly discussed with the patient and questions answered.  The benefits, risks, alternatives and complications were also discussed.  The patient understands and wishes to proceed with the procedure.  Written consent was obtained.  Ultrasound was performed to localize and mark an adequate pocket of fluid in the left chest.  The area was then prepped and draped in the normal sterile fashion.  1% Lidocaine was used for local anesthesia.  Under ultrasound guidance a 19 gauge Yueh catheter was introduced.  Thoracentesis was performed.  The catheter was removed and a dressing applied.  Complications:  None immediate  Findings: A total of approximately 580 ml of yellow serous fluid was removed. A fluid sample was sent for laboratory analysis.  IMPRESSION: Successful ultrasound guided left thoracentesis yielding 580 ml of pleural fluid.  Read by: Anselm Pancoast, P.A.-C  Original Report Authenticated By: Osa Craver, M.D.    Microbiology: Recent Results (from the past 240 hour(s))  MRSA PCR SCREENING     Status: Normal   Collection Time   10/11/11  5:54 PM      Component Value Range Status Comment   MRSA by PCR NEGATIVE  NEGATIVE Final      Labs: Basic Metabolic Panel:  Lab 10/13/11 1610 10/12/11 0639 10/11/11 1000  NA 139 140 141  K 3.5 3.7 3.7  CL 100 102 103  CO2 30 28 27   GLUCOSE 95 143* 141*  BUN 33* 26* 28*  CREATININE 1.13* 0.83 0.91  CALCIUM 8.9 8.2* 9.6  MG -- -- --  PHOS -- -- --   Liver Function Tests: No results found for this basename: AST:5,ALT:5,ALKPHOS:5,BILITOT:5,PROT:5,ALBUMIN:5 in the last 168 hours No results found for this basename: LIPASE:5,AMYLASE:5 in the last 168 hours No results found for this basename: AMMONIA:5 in the last 168 hours CBC:  Lab 10/11/11 1000  WBC 18.0*  NEUTROABS 17.2*  HGB 10.5*  HCT 34.6*  MCV 85.2  PLT 457*   Cardiac Enzymes:  Lab 10/12/11 0638 10/11/11 2343  10/11/11 1000  CKTOTAL -- -- --  CKMB -- -- --  CKMBINDEX -- -- --  TROPONINI <0.30 <0.30 <0.30   BNP: BNP (last 3 results)  Basename 10/11/11 1000 10/05/11 1614 07/19/11 0309  PROBNP 96045.4* 2191.0* 26629.0*   CBG: No results found for this basename: GLUCAP:5 in the last 168 hours  Time coordinating discharge: 45 minutes  Signed:  Benjamine Mola JESSICA  [facility] 10/13/2011, 8:03 AM

## 2011-10-17 NOTE — Progress Notes (Signed)
Quick Note:  Called, spoke with pt's daughter, Rosline. She will have pt call back. ______

## 2011-10-18 NOTE — Progress Notes (Signed)
Quick Note:  Spoke with pt and notified of results per Dr. Wert. Pt verbalized understanding and denied any questions.  ______ 

## 2011-11-10 ENCOUNTER — Ambulatory Visit: Payer: Medicare Other | Admitting: Internal Medicine

## 2011-11-22 ENCOUNTER — Encounter: Payer: Self-pay | Admitting: Internal Medicine

## 2011-11-22 ENCOUNTER — Ambulatory Visit (INDEPENDENT_AMBULATORY_CARE_PROVIDER_SITE_OTHER): Payer: Medicare Other | Admitting: Internal Medicine

## 2011-11-22 ENCOUNTER — Ambulatory Visit (INDEPENDENT_AMBULATORY_CARE_PROVIDER_SITE_OTHER)
Admission: RE | Admit: 2011-11-22 | Discharge: 2011-11-22 | Disposition: A | Discharge status: Home / Self Care | Payer: Medicare Other | Source: Ambulatory Visit | Attending: Internal Medicine | Admitting: Internal Medicine

## 2011-11-22 VITALS — BP 110/60 | HR 62 | Temp 97.6°F | Ht 65.0 in | Wt 93.6 lb

## 2011-11-22 DIAGNOSIS — J449 Chronic obstructive pulmonary disease, unspecified: Secondary | ICD-10-CM

## 2011-11-22 DIAGNOSIS — J9600 Acute respiratory failure, unspecified whether with hypoxia or hypercapnia: Secondary | ICD-10-CM

## 2011-11-22 DIAGNOSIS — J90 Pleural effusion, not elsewhere classified: Secondary | ICD-10-CM

## 2011-11-22 NOTE — Patient Instructions (Addendum)
Continue the 02 at bedtime and during the day if needed  Please remember to go to  x-ray department downstairs for your tests - we will call you with the results when they are available.

## 2011-11-22 NOTE — Progress Notes (Signed)
Subjective:     Patient ID: [name], female   DOB: 08-May-1929  MRN: [id number]  HPI  66 yowf quit smoking around 2000 and no breathing problems at baseline   Admit date: 09/22/2011  Discharge date: 09/29/2011  Recommendations for Outpatient Follow-up:  1. Pt will need to follow up with PCP in 1-2 weeks post discharge 2. Please obtain BMP to evaluate electrolytes and kidney function, also check potassium level, please note that potassium upon discharge was 3.4 and patient was given one dose of K. Dur 40 mEq by mouth 3. Please also check CBC to evaluate Hg and Hct levels, hemoglobin levels noted to be around 8-9 postoperatively 4. Patient will be discharged to [facility] for further rehabilitation Discharge Diagnoses: Hip fracture in the setting of mechanical fall  Principal Problem:  *Hip fracture  Active Problems:  Anemia due to chronic illness  Leukocytosis  Acute on chronic systolic CHF (congestive heart failure), NYHA class 3  COPD (chronic obstructive pulmonary disease)  Failure to thrive in adult  Preop cardiovascular exam  Discharge Condition: Stable  Diet recommendation: Heart healthy diet discussed in details  Brief narrative:  Pt admitted on 08/02 after sustaining fall and now is status post left hip arthroplasty post op day #2. Also status post left thoracentesis (08/04) with total of 580 cc of fluid removed.  Principal Problem:  *Hip fracture  - status post left hip arthroplasty, post operative day 4  - pt doing clinically well and will continue physical therapy at [facility]  Active Problems:  Anemia due to chronic illness  - now acute blood loss anemia, post op  - Hg and Hct slightly down from yesterday but appears stable between 8 - 9  - CBC will have to be rechecked in an outpatient setting to ensure that it stays at patient's baseline  - Patient has not required blood transfusion during this hospitalization  Leukocytosis  -  likely secondary to acute problem  - WBC trending down and is within normal limits on the day of discharge  - Patient has not required antibiotics during this hospital stay  Acute on chronic systolic CHF (congestive heart failure), NYHA class 3  - clinically stable, stable weight  - continued home dose Lasix  COPD (chronic obstructive pulmonary disease)  - clinically compensated  - pt maintaining oxygen saturations > 93% during hospital stay  Failure to thrive in adult  - BMI < 18  - encouraged PO intake  CKD, stage II  - based on GFR  - creatinine is stable and within normal limits  Atria Fibrillation  - currently in normal sinus rhythm  - rate controlled  - continue Amiodarone  Left sided pleural effusion  - status post left side thoracentesis (08/04)  - pt clinically doing well  Consultants:  Orthopedics  Procedures/Studies:  Dg Pelvis Portable  09/25/2011  IMPRESSION:  Expected appearance status post left hip arthroplasty.  Antibiotics:  None   10/05/2011 1st pulmonary ov reports doe chronically x one aisle at [employer] no 02, no inhalers x at night at baseline,  Worse sob x 48 h so  02 restarted 10/05/2011 with reported orthopnea/ desat in NH and feeling some better on 02 -  No unusual cough, purulent sputum or sinus/hb symptoms on present rx. No pleuritic cp or hemoptysis or significant leg swelling- some better p saba rec Please remember to go to the lab and x-ray department > d dimer 2.58/ bnp 2200 > rec  ctangiogram > refused Wear 02 24 hours per day and use the inhaler 2 pffs up to every 4 hours but if condition worsens go to ER immediately Please schedule a follow up office visit in 1 week > admitted with chf dx 8/21-23  11/22/2011 f/u ov/Tasmine Hipwell cc better p rx lasix,  Sleeping well on 02, no cough, overall much better since admit and diuresis, occ subjective wheeze at hs but not interfering with sleep or needing saba     Also denies any obvious fluctuation of symptoms  with weather or environmental changes or other aggravating or alleviating factors except as outlined above   ROS  The following are not active complaints unless bolded sore throat, dysphagia, dental problems, itching, sneezing,  nasal congestion or excess/ purulent secretions, ear ache,   fever, chills, sweats, unintended wt loss, pleuritic or exertional cp, hemoptysis,  orthopnea pnd or leg swelling, presyncope, palpitations, heartburn, abdominal pain, anorexia, nausea, vomiting, diarrhea  or change in bowel or urinary habits, change in stools or urine, dysuria,hematuria,  rash, arthralgias, visual complaints, headache, numbness weakness or ataxia or problems with walking or coordination,  change in mood/affect or memory.                Objective:   Physical Exam 11/22/2011 wt 93 Wt Readings from Last 3 Encounters:  10/05/11 93 lb 12.8 oz (42.547 kg)  09/29/11 98 lb 1.7 oz (44.5 kg)  09/29/11 98 lb 1.7 oz (44.5 kg)    HEENT mild turbinate edema.  Oropharynx no thrush or excess pnd or cobblestoning.  No JVD or cervical adenopathy. Mild accessory muscle hypertrophy. Trachea midline, nl thryroid. Chest was hyperinflated by percussion with diminished breath sounds and moderate increased exp time without wheeze. Hoover sign positive at mid inspiration. Regular rate and rhythm without murmur gallop or rub or increase P2 or edema.  Abd: no hsm, nl excursion. Ext warm without cyanosis or clubbing.       CXR  11/22/2011 : Interval resolution of changes of pulmonary edema. Bilateral pleural effusions are decreased in volume compared to the prior study. A small effusion still persists on the left.      Assessment:         Plan:

## 2011-11-23 NOTE — Assessment & Plan Note (Addendum)
-   Tap L chest 09/24/11 >> 580 serous, prot 3.4 and ldh 89, wbc 84  With 67% Lymphs, react cytololgy   - Resolved 11/22/11 with diuresis  No further f/u needed > rec keep as dry as tolerates

## 2011-11-23 NOTE — Assessment & Plan Note (Signed)
-   0 2 restarted 10/05/11  - CTangiogram rec 10/06/2011 > refused  - Marked improvement after admit 10/11/11 and rx as chf   Clearly this is mostly related to chf, and noct 02 should help this, so she should continue

## 2011-11-23 NOTE — Assessment & Plan Note (Signed)
May have component of cardiac asthma so saba can just be prn as can f/u

## 2011-11-24 NOTE — Progress Notes (Signed)
Quick Note:  Spoke with pt and notified of results per Dr. Wert. Pt verbalized understanding and denied any questions.  ______ 

## 2011-11-30 ENCOUNTER — Emergency Department (HOSPITAL_COMMUNITY): Payer: Medicare Other

## 2011-11-30 ENCOUNTER — Emergency Department (HOSPITAL_COMMUNITY)
Admission: EM | Admit: 2011-11-30 | Discharge: 2011-11-30 | Disposition: A | Discharge status: Home / Self Care | Payer: Medicare Other | Attending: Emergency Medicine | Admitting: Emergency Medicine

## 2011-11-30 ENCOUNTER — Encounter (HOSPITAL_COMMUNITY): Payer: Self-pay | Admitting: Unknown Physician Specialty

## 2011-11-30 DIAGNOSIS — I739 Peripheral vascular disease, unspecified: Secondary | ICD-10-CM | POA: Insufficient documentation

## 2011-11-30 DIAGNOSIS — W19XXXA Unspecified fall, initial encounter: Secondary | ICD-10-CM

## 2011-11-30 DIAGNOSIS — R51 Headache: Secondary | ICD-10-CM | POA: Insufficient documentation

## 2011-11-30 DIAGNOSIS — Z79899 Other long term (current) drug therapy: Secondary | ICD-10-CM | POA: Insufficient documentation

## 2011-11-30 DIAGNOSIS — W1809XA Striking against other object with subsequent fall, initial encounter: Secondary | ICD-10-CM | POA: Insufficient documentation

## 2011-11-30 DIAGNOSIS — Z87891 Personal history of nicotine dependence: Secondary | ICD-10-CM | POA: Insufficient documentation

## 2011-11-30 DIAGNOSIS — M810 Age-related osteoporosis without current pathological fracture: Secondary | ICD-10-CM | POA: Insufficient documentation

## 2011-11-30 DIAGNOSIS — I4891 Unspecified atrial fibrillation: Secondary | ICD-10-CM | POA: Insufficient documentation

## 2011-11-30 DIAGNOSIS — J449 Chronic obstructive pulmonary disease, unspecified: Secondary | ICD-10-CM | POA: Insufficient documentation

## 2011-11-30 DIAGNOSIS — I509 Heart failure, unspecified: Secondary | ICD-10-CM | POA: Insufficient documentation

## 2011-11-30 DIAGNOSIS — Z85828 Personal history of other malignant neoplasm of skin: Secondary | ICD-10-CM | POA: Insufficient documentation

## 2011-11-30 DIAGNOSIS — Z853 Personal history of malignant neoplasm of breast: Secondary | ICD-10-CM | POA: Insufficient documentation

## 2011-11-30 DIAGNOSIS — J4489 Other specified chronic obstructive pulmonary disease: Secondary | ICD-10-CM | POA: Insufficient documentation

## 2011-11-30 DIAGNOSIS — S0990XA Unspecified injury of head, initial encounter: Secondary | ICD-10-CM | POA: Insufficient documentation

## 2011-11-30 DIAGNOSIS — I10 Essential (primary) hypertension: Secondary | ICD-10-CM | POA: Insufficient documentation

## 2011-11-30 LAB — PLATELET FUNCTION ASSAY: Collagen / ADP: 100 seconds (ref 0–114)

## 2011-11-30 NOTE — ED Notes (Signed)
Patient is refusing to have CT scan done for personal reasons and became upset. Resident updated.

## 2011-11-30 NOTE — ED Notes (Signed)
Dr. Jeraldine Loots informed of pt blood pressure, pt and family sts her blood pressure normally runs low.  Dr. Jeraldine Loots said it was okay for pt to be discharged.

## 2011-11-30 NOTE — ED Notes (Signed)
Pt in from home via [facility], pt fell hitting posterior head, pt denies LOC, pt has skin tear to posterior head, pt ambulatory at home with cane, pt arrived in c collar & KED, pt denies pain in bilateral extremities & lower back, pt A&O x4, follows commands, speaks in complete sentences

## 2011-11-30 NOTE — ED Notes (Signed)
Patient became upset and demanded to be able to leave. I called her daughter and let the patient speak with her. She is cooperative at this time. Daughter will be here in 15 min. Gave the patient a meal.

## 2011-11-30 NOTE — ED Notes (Signed)
Patient states she slipped while ambulating thru the living room and hit her head on the floor. She then states she was here because her husband tripped over the dog and he was brought in by EMS. She then stated she was crawling to the bathroom because she had to urinate and she wasn't suppose to get out of bed because her daughter didn't want her to fall and she hit her head on the night stand. She can state the correct year, month and president.

## 2011-11-30 NOTE — ED Provider Notes (Signed)
I saw and evaluated the patient, reviewed the resident's note and I agree with the findings and plan.   Loren Racer, MD 11/30/11 [phone]

## 2011-11-30 NOTE — ED Provider Notes (Signed)
MDM: Assumed care from Dr. Lara Mulch at 1600. See her note for full history of present illness and physical exam. The patient presents after a mechanical fall. Denies any chest pain shortness of breath lightheadedness or dizziness. Initially patient declined to have a CT scan or EKG. At time of signout. Awaiting CT results.  7:42 PM had a thorough discussion with the daughter and the patient including return precautions. Head CT with no acute intracranial abnormality. Patient continues to deny chest pain continues to deny shortness of breath. Both daughter and patient express understanding they will return immediately to the emergent arm and should any of the red flags present.   Date: 11/30/2011  Rate: 70   Rhythm: normal sinus rhythm  QRS Axis: normal  Intervals: QT prolonged  ST/T Wave abnormalities: nonspecific ST changes  Conduction Disutrbances:none  Narrative Interpretation:   Old EKG Reviewed: changes noted and Prominent P waves in leads 2-3 aVF and anterior leads        DG Chest Portable 1 View (Final result)   Result time:11/30/11 1704    Final result by Rad Results In Interface (11/30/11 17:04:52)    Narrative:   *RADIOLOGY REPORT*  Clinical Data: Fall today hitting head on night stand.  PORTABLE CHEST - 1 VIEW  Comparison: 11/22/2011. 10/12/2011.  Findings: 1646 hours. The heart size and mediastinal contours are stable. There is a persistent left pleural effusion with slightly increased associated left basilar air space disease. There is chronic lung disease with patchy right basilar atelectasis. No pneumothorax or acute fractures seen. There are surgical clips within the right axilla.  IMPRESSION: Persistent left pleural effusion with slightly increased left basilar airspace disease. The lungs otherwise appear unchanged.   Original Report Authenticated By: Gerrianne Scale, M.D.             CT Head Wo Contrast (Final result)   Result time:11/30/11  [phone]    Final result by Rad Results In Interface (11/30/11 16:12:43)    Narrative:   *RADIOLOGY REPORT*  Clinical Data: Status post fall. Posterior head laceration.  CT HEAD WITHOUT CONTRAST CT CERVICAL SPINE WITHOUT CONTRAST  Technique: Multidetector CT imaging of the head and cervical spine was performed following the standard protocol without intravenous contrast. Multiplanar CT image reconstructions of the cervical spine were also generated.  Comparison: The skull radiographs 06/12/2003.  CT HEAD  Findings: There is no evidence of acute intracranial hemorrhage, mass effect, brain edema or extra-axial fluid collection. The ventricles and subarachnoid spaces are diffusely prominent consistent with mild atrophy. Diffuse periventricular low density is most compatible with advanced chronic small vessel ischemic change. There is asymmetric involvement in the right parietal region. No acute cortical infarction is seen. There are diffuse intracranial vascular calcifications.  The visualized paranasal sinuses are clear. The calvarium is intact. Focal soft tissue swelling is noted in the left occipital scalp.  IMPRESSION: Acute intracranial or calvarial findings. Advanced periventricular white matter disease and left occipital scalp soft tissue swelling are noted.  CT CERVICAL SPINE  Findings: The cervical alignment is normal. There is no evidence of acute fracture or traumatic subluxation. There is a mildly exaggerated cervical lordosis with mild spondylosis.  Diffuse vascular calcifications are noted. There is bilateral thyroid nodularity with a 1.4 cm low density lesion in the right lobe on image 55. Emphysematous changes are present in both lung apices. No acute soft tissue findings are seen.  IMPRESSION:  1. No evidence of acute cervical spine fracture, traumatic subluxation or static  signs of instability. 2. Nonspecific bilateral thyroid nodularity. This could be  further evaluated with thyroid ultrasound as clinically warranted. 3. Biapical emphysematous changes.   Original Report Authenticated By: Gerrianne Scale, M.D.             CT Cervical Spine Wo Contrast (Final result)   Result time:11/30/11 1612    Final result by Rad Results In Interface (11/30/11 16:12:42)    Narrative:   *RADIOLOGY REPORT*  Clinical Data: Status post fall. Posterior head laceration.  CT HEAD WITHOUT CONTRAST CT CERVICAL SPINE WITHOUT CONTRAST  Technique: Multidetector CT imaging of the head and cervical spine was performed following the standard protocol without intravenous contrast. Multiplanar CT image reconstructions of the cervical spine were also generated.  Comparison: The skull radiographs 06/12/2003.  CT HEAD  Findings: There is no evidence of acute intracranial hemorrhage, mass effect, brain edema or extra-axial fluid collection. The ventricles and subarachnoid spaces are diffusely prominent consistent with mild atrophy. Diffuse periventricular low density is most compatible with advanced chronic small vessel ischemic change. There is asymmetric involvement in the right parietal region. No acute cortical infarction is seen. There are diffuse intracranial vascular calcifications.  The visualized paranasal sinuses are clear. The calvarium is intact. Focal soft tissue swelling is noted in the left occipital scalp.  IMPRESSION: Acute intracranial or calvarial findings. Advanced periventricular white matter disease and left occipital scalp soft tissue swelling are noted.  CT CERVICAL SPINE  Findings: The cervical alignment is normal. There is no evidence of acute fracture or traumatic subluxation. There is a mildly exaggerated cervical lordosis with mild spondylosis.  Diffuse vascular calcifications are noted. There is bilateral thyroid nodularity with a 1.4 cm low density lesion in the right lobe on image 55. Emphysematous changes  are present in both lung apices. No acute soft tissue findings are seen.  IMPRESSION:  1. No evidence of acute cervical spine fracture, traumatic subluxation or static signs of instability. 2. Nonspecific bilateral thyroid nodularity. This could be further evaluated with thyroid ultrasound as clinically warranted. 3. Biapical emphysematous changes.   Original Report Authenticated By: Gerrianne Scale, M.D.      Nadara Mustard, MD 11/30/11 [phone]

## 2011-11-30 NOTE — Progress Notes (Signed)
Chaplain visited patient immediately as a response to nurse's pager request. Chaplain served as liaison between nurse, patient and family member. Chaplain shared words of comfort with patient and family. Chaplain will continue to visit and provide care to patient and family as needed at a later time.

## 2011-11-30 NOTE — ED Notes (Addendum)
Daughter is at bedside. Confirmed events with her. Patient is still refusing to have CT scan done. Explained process with both and left them to discuss if patient will agree to scan.
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
## 2011-12-01 NOTE — ED Provider Notes (Signed)
I was available throughout the later portion of this patient's course.  Per the prior care team, the patient was appropriate for d/c if her CT scans were unremarkable.   Gerhard Munch, MD 12/01/11 [phone]

## 2012-02-26 ENCOUNTER — Encounter: Payer: Self-pay | Admitting: Neurosurgery

## 2012-02-27 ENCOUNTER — Ambulatory Visit: Payer: Medicare Other | Admitting: Neurosurgery

## 2012-05-03 ENCOUNTER — Ambulatory Visit: Payer: Medicare Other | Admitting: Neurosurgery

## 2012-05-03 ENCOUNTER — Other Ambulatory Visit: Payer: Medicare Other

## 2012-05-10 ENCOUNTER — Encounter (INDEPENDENT_AMBULATORY_CARE_PROVIDER_SITE_OTHER): Payer: Medicare Other | Admitting: Vascular Surgery

## 2012-05-10 ENCOUNTER — Other Ambulatory Visit (INDEPENDENT_AMBULATORY_CARE_PROVIDER_SITE_OTHER): Payer: Medicare Other | Admitting: Vascular Surgery

## 2012-05-10 ENCOUNTER — Ambulatory Visit: Payer: Medicare Other | Admitting: Neurosurgery

## 2012-05-10 DIAGNOSIS — I70219 Atherosclerosis of native arteries of extremities with intermittent claudication, unspecified extremity: Secondary | ICD-10-CM

## 2012-05-10 DIAGNOSIS — I6529 Occlusion and stenosis of unspecified carotid artery: Secondary | ICD-10-CM

## 2012-05-14 ENCOUNTER — Other Ambulatory Visit: Payer: Self-pay

## 2012-05-14 DIAGNOSIS — I739 Peripheral vascular disease, unspecified: Secondary | ICD-10-CM

## 2012-05-14 DIAGNOSIS — I70219 Atherosclerosis of native arteries of extremities with intermittent claudication, unspecified extremity: Secondary | ICD-10-CM

## 2012-05-15 ENCOUNTER — Encounter: Payer: Self-pay | Admitting: Vascular Surgery

## 2012-11-28 ENCOUNTER — Ambulatory Visit (INDEPENDENT_AMBULATORY_CARE_PROVIDER_SITE_OTHER): Payer: Medicare Other | Admitting: Podiatry

## 2012-11-28 ENCOUNTER — Encounter: Payer: Self-pay | Admitting: Podiatry

## 2012-11-28 VITALS — BP 129/64 | HR 68 | Resp 12 | Ht 66.0 in | Wt 98.0 lb

## 2012-11-28 DIAGNOSIS — M216X9 Other acquired deformities of unspecified foot: Secondary | ICD-10-CM

## 2012-11-28 DIAGNOSIS — M79609 Pain in unspecified limb: Secondary | ICD-10-CM

## 2012-11-28 DIAGNOSIS — L259 Unspecified contact dermatitis, unspecified cause: Secondary | ICD-10-CM

## 2012-11-28 DIAGNOSIS — B351 Tinea unguium: Secondary | ICD-10-CM

## 2012-11-28 NOTE — Progress Notes (Signed)
Subjective:     Patient ID: [name], female   DOB: [birth year], 79 y.o.   MRN: [id number]  Toe Pain    patient complains of nails and is here to pick up her orthotics. She is moderately disoriented.   Review of Systems  All other systems reviewed and are negative.       Objective:   Physical Exam  Cardiovascular: Intact distal pulses.   Neurological: She is alert.  Skin: Skin is warm.   The patient has prominent first metatarsals bilateral but become irritated for her Assessment:     Plantarflexed first metatarsals creating bone exposure bilateral.    Plan:     Dispensed orthotics with instructions on usage. Due to continued soft-type shoe wear and not going barefoot. Reappoint 6 weeks.

## 2012-11-28 NOTE — Patient Instructions (Signed)
Will call when orthotics are returned

## 2012-12-16 ENCOUNTER — Ambulatory Visit (INDEPENDENT_AMBULATORY_CARE_PROVIDER_SITE_OTHER): Payer: 59 | Admitting: Psychiatry

## 2012-12-16 ENCOUNTER — Encounter (HOSPITAL_COMMUNITY): Payer: Self-pay | Admitting: Psychiatry

## 2012-12-16 ENCOUNTER — Telehealth (HOSPITAL_COMMUNITY): Payer: Self-pay

## 2012-12-16 DIAGNOSIS — F3289 Other specified depressive episodes: Secondary | ICD-10-CM

## 2012-12-16 DIAGNOSIS — F329 Major depressive disorder, single episode, unspecified: Secondary | ICD-10-CM
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
## 2012-12-24 ENCOUNTER — Encounter: Payer: Self-pay | Admitting: Cardiology

## 2012-12-24 ENCOUNTER — Ambulatory Visit (INDEPENDENT_AMBULATORY_CARE_PROVIDER_SITE_OTHER): Payer: Medicare Other | Admitting: Cardiology

## 2012-12-24 VITALS — BP 104/65 | HR 62 | Wt 92.5 lb

## 2012-12-24 DIAGNOSIS — I771 Stricture of artery: Secondary | ICD-10-CM

## 2012-12-24 DIAGNOSIS — I4891 Unspecified atrial fibrillation: Secondary | ICD-10-CM

## 2012-12-24 DIAGNOSIS — E43 Unspecified severe protein-calorie malnutrition: Secondary | ICD-10-CM

## 2012-12-24 DIAGNOSIS — I5022 Chronic systolic (congestive) heart failure: Secondary | ICD-10-CM

## 2012-12-24 DIAGNOSIS — J449 Chronic obstructive pulmonary disease, unspecified: Secondary | ICD-10-CM

## 2012-12-24 DIAGNOSIS — I708 Atherosclerosis of other arteries: Secondary | ICD-10-CM

## 2012-12-24 HISTORY — DX: Chronic systolic (congestive) heart failure: I50.22

## 2012-12-24 MED ORDER — FUROSEMIDE 80 MG PO TABS: 80.0000 mg | ORAL_TABLET | Freq: Every day | ORAL | Status: DC
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
## 2012-12-24 NOTE — Patient Instructions (Addendum)
Your physician recommends that you continue on your current medications as directed. Please refer to the Current Medication list given to you today.  Your physician wants you to follow-up in:  4 months with Dr. Skains. You will receive a reminder letter in the mail two months in advance. If you don't receive a letter, please call our office to schedule the follow-up appointment.  

## 2013-01-02 ENCOUNTER — Ambulatory Visit (INDEPENDENT_AMBULATORY_CARE_PROVIDER_SITE_OTHER): Payer: 59 | Admitting: Psychiatry

## 2013-01-02 ENCOUNTER — Encounter (HOSPITAL_COMMUNITY): Payer: Self-pay | Admitting: Psychiatry

## 2013-01-02 DIAGNOSIS — F4323 Adjustment disorder with mixed anxiety and depressed mood: Secondary | ICD-10-CM | POA: Insufficient documentation

## 2013-01-02 NOTE — Progress Notes (Signed)
   THERAPIST PROGRESS NOTE  Session Time: 12:30-1:20  Participation Level: Active  Behavioral Response: CasualAlertAnxious  Type of Therapy: Individual Therapy  Treatment Goals addressed: emotion regulation, stress management  Interventions: CBT  Summary: [name] is a 79 y.o. female who presents with anxiety.   Suicidal/Homicidal: Nowithout intent/plan  Therapist Response: Pt. Processed strained relationships with estranged daughter and husband, concerns about anger and irritability. Assisted with validation of feelings and normalization of anger, irritability and sadness given the distance in significant relationships.  Plan: Return again in 2 weeks.  Diagnosis: Axis I: Anxiety Disorder NOS    Axis II: No diagnosis    Wynonia Musty 01/02/2013

## 2013-01-14 ENCOUNTER — Ambulatory Visit (INDEPENDENT_AMBULATORY_CARE_PROVIDER_SITE_OTHER): Payer: 59 | Admitting: Psychiatry

## 2013-01-14 ENCOUNTER — Encounter (HOSPITAL_COMMUNITY): Payer: Self-pay | Admitting: Psychiatry

## 2013-01-14 DIAGNOSIS — F411 Generalized anxiety disorder: Secondary | ICD-10-CM

## 2013-01-14 DIAGNOSIS — F4323 Adjustment disorder with mixed anxiety and depressed mood: Secondary | ICD-10-CM

## 2013-01-14 NOTE — Progress Notes (Signed)
Patient ID: [name], female   DOB: [birth year], 79 y.o.   MRN: [id number]  Session Time: 10:00-10:50   Participation Level: Active   Behavioral Response: CasualAlertDepressed  Type of Therapy: Individual Therapy   Treatment Goals addressed: emotion regulation, stress management   Interventions: CBT   Summary: [name] is a 79 y.o. female who presents with depression and anxiety.   Suicidal/Homicidal: Nowithout intent/plan   Therapist Response: Pt. Continues to process strained relationships with estranged daughter and husband. Session focused on exploration and validation of feelings, grief/loss and acceptance.   Plan: Continue with cognitive-behavioral based therapy with use of mindfulness based interventions including heartmath, guided visualization, breathwork, and meditation. Pt. To return in 2 again in 2 weeks.   Diagnosis: Axis I: Anxiety Disorder NOS   Axis II: No diagnosis   Wynonia Musty   01/14/2013

## 2013-01-23 ENCOUNTER — Ambulatory Visit (INDEPENDENT_AMBULATORY_CARE_PROVIDER_SITE_OTHER): Payer: 59 | Admitting: Psychiatry

## 2013-01-23 ENCOUNTER — Encounter (HOSPITAL_COMMUNITY): Payer: Self-pay | Admitting: Psychiatry

## 2013-01-23 VITALS — BP 109/89 | HR 69 | Ht 65.0 in | Wt 94.0 lb

## 2013-01-23 DIAGNOSIS — F329 Major depressive disorder, single episode, unspecified: Secondary | ICD-10-CM

## 2013-01-23 MED ORDER — SERTRALINE HCL 25 MG PO TABS: ORAL_TABLET | ORAL | Status: DC
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
## 2013-01-24 ENCOUNTER — Telehealth (HOSPITAL_COMMUNITY): Payer: Self-pay

## 2013-02-04 ENCOUNTER — Ambulatory Visit (HOSPITAL_COMMUNITY): Payer: Self-pay | Admitting: Psychiatry

## 2013-02-18 ENCOUNTER — Telehealth: Payer: Self-pay | Admitting: *Deleted

## 2013-02-18 NOTE — Telephone Encounter (Signed)
Patient left a voicemail on the refill line stating that the albuterol that Dr Dione Housekeeper gave her last March has lost its strength and she feels that it doesn't help anymore. Please advise. Thanks, MI

## 2013-02-19 ENCOUNTER — Encounter (HOSPITAL_COMMUNITY): Payer: Self-pay | Admitting: Psychiatry

## 2013-02-19 ENCOUNTER — Ambulatory Visit (INDEPENDENT_AMBULATORY_CARE_PROVIDER_SITE_OTHER): Payer: 59 | Admitting: Psychiatry

## 2013-02-19 VITALS — BP 127/77 | HR 77 | Ht 65.0 in | Wt 94.0 lb

## 2013-02-19 DIAGNOSIS — F329 Major depressive disorder, single episode, unspecified: Secondary | ICD-10-CM

## 2013-02-19 MED ORDER — SERTRALINE HCL 25 MG PO TABS: ORAL_TABLET | ORAL | Status: DC
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
## 2013-02-20 IMAGING — CR DG CHEST 1V
1 series · 1 of 1 positions shown · non-contrast
Comparison: 08/29/2011 and prior chest radiographs

CLINICAL DATA: 81-year-old female with left hip fracture -
preoperative respiratory examination.

CHEST - 1 VIEW

[t chest supine]
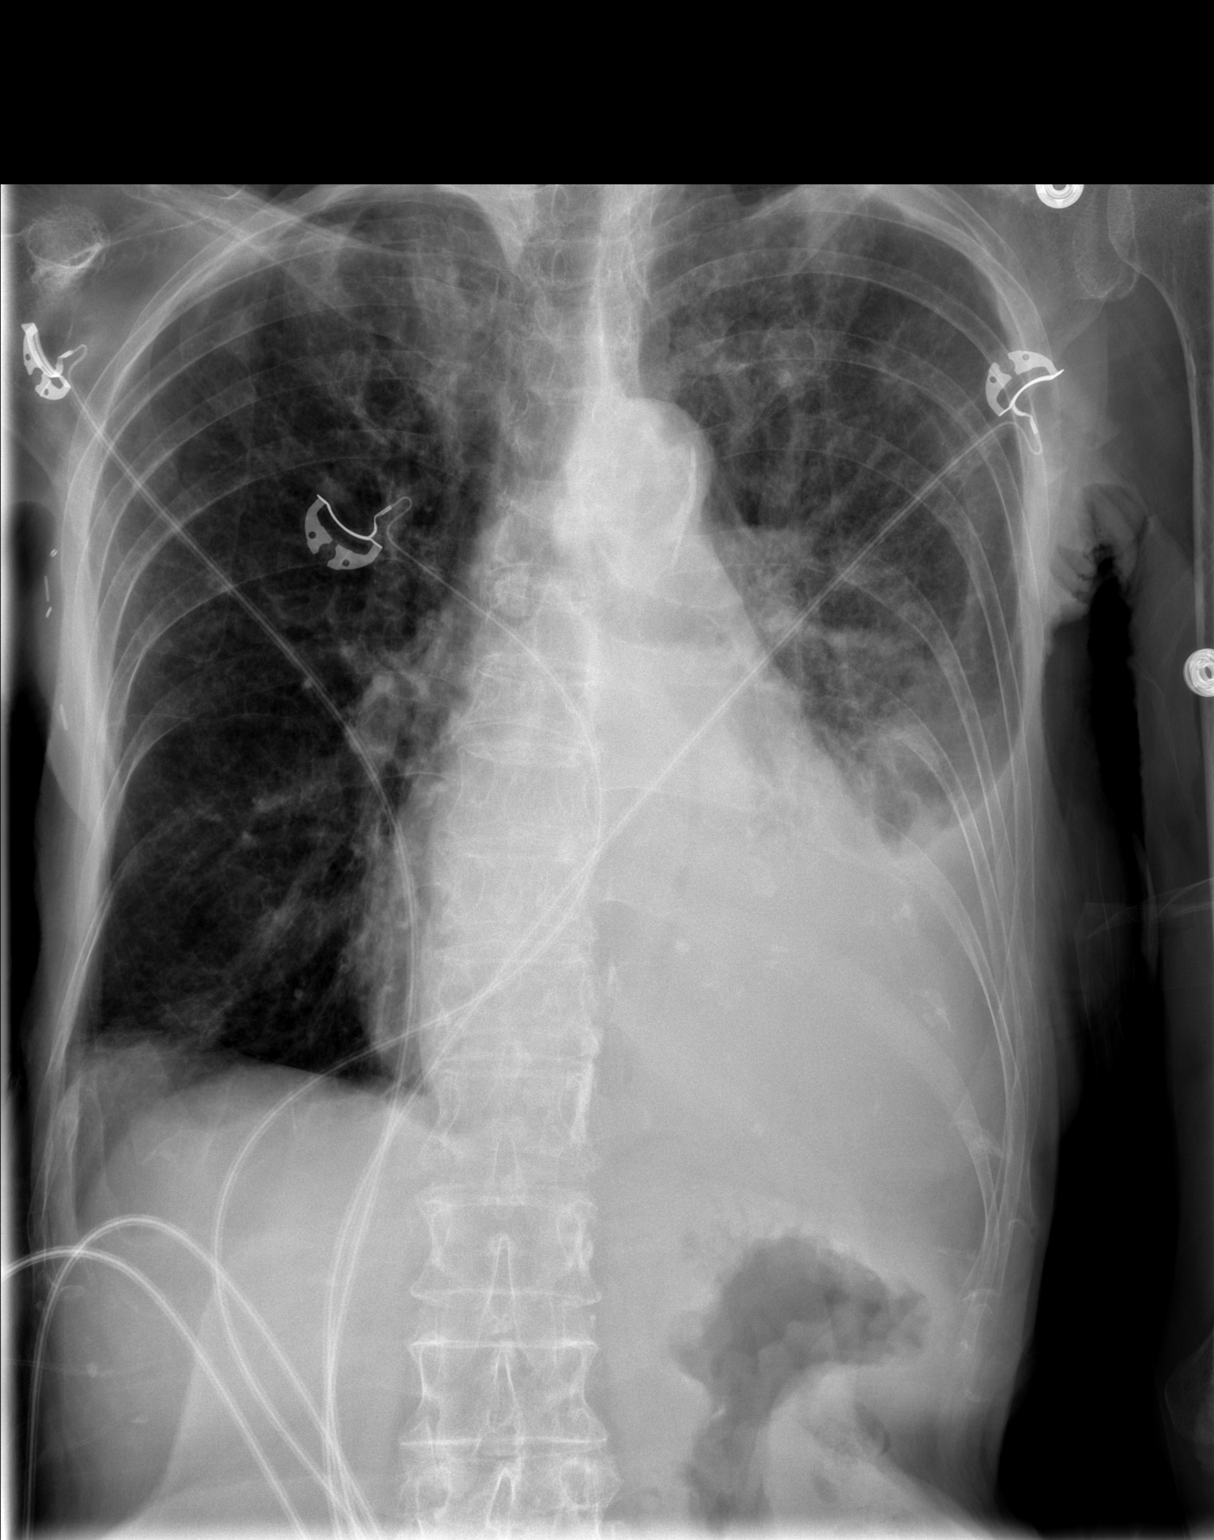

[1 of 1 positions shown; findings below may reference images not displayed]

FINDINGS: A moderate to large left pleural effusion is again noted
with increasing left lower lung atelectasis.
COPD/emphysema is again identified with increasing pulmonary
vascular congestion.
Mild right basilar opacity is noted - suspect atelectasis.
There is no evidence of pneumothorax.
No acute bony abnormalities are present.
Surgical clips in the right axillary region are again noted.
IMPRESSION: Unchanged moderate to large left pleural effusion with increasing
left lower lung atelectasis.

Pulmonary vascular congestion.

Mild right basilar opacity - suspect atelectasis.

## 2013-02-22 IMAGING — CR DG CHEST 1V
1 series · 1 of 1 positions shown · non-contrast
Comparison: 09/24/2011

CLINICAL DATA: Status post thoracentesis

CHEST - 1 VIEW

[view not recorded]
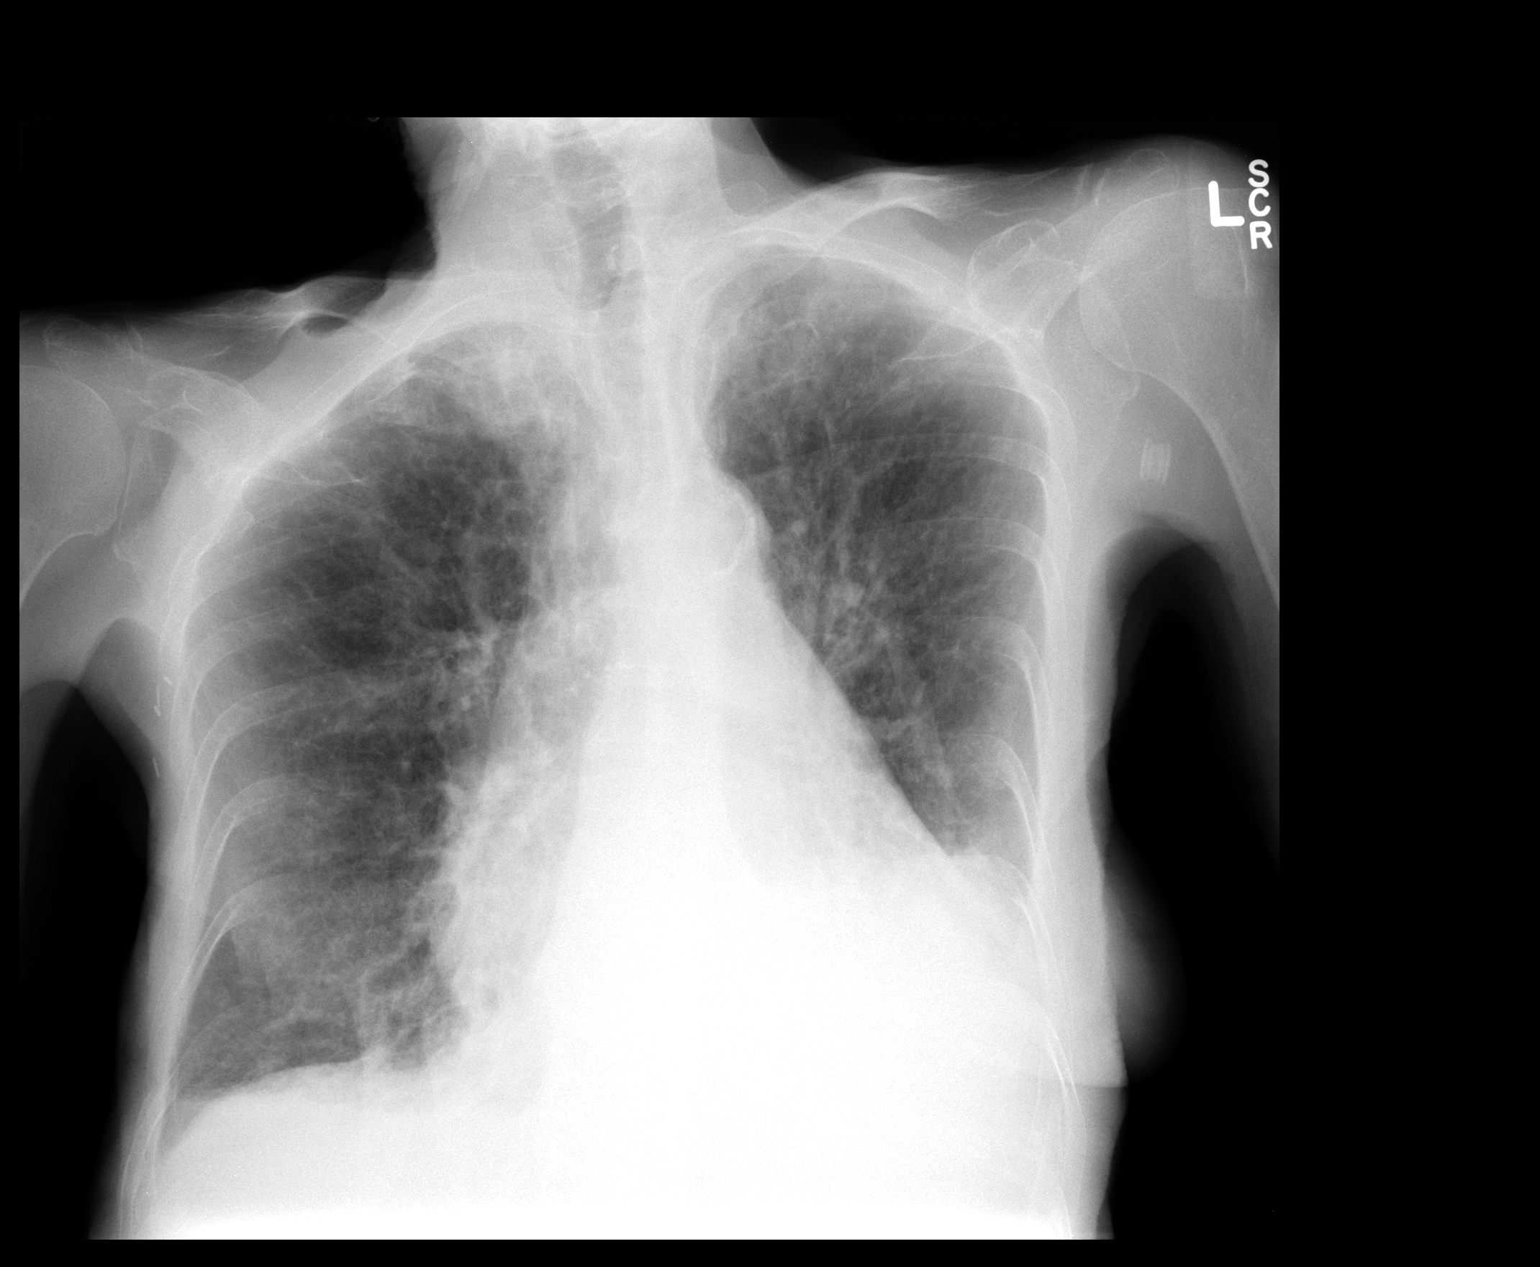

[1 of 1 positions shown; findings below may reference images not displayed]

FINDINGS: Heart size appears enlarged.  This is unchanged from
previous exam.  There has been interval decrease in volume of left
effusion.  No pneumothorax identified.  Pulmonary edema pattern has
also improved from previous exam.  Chronic interstitial coarsening
is again noted.
IMPRESSION: 1.  No pneumothorax after left sided thoracentesis.

## 2013-02-24 NOTE — Telephone Encounter (Signed)
Please have her discuss this with her primary doctor or pulmonary. I do not usually prescribe albuterol. Thank you.

## 2013-02-24 NOTE — Telephone Encounter (Signed)
Dr Marlou Porch please review and advise.

## 2013-02-25 ENCOUNTER — Ambulatory Visit (HOSPITAL_COMMUNITY): Payer: Self-pay | Admitting: Psychiatry

## 2013-02-27 NOTE — Telephone Encounter (Signed)
Spoke with patient advised to consult with PCP or pulmonologist.

## 2013-03-05 ENCOUNTER — Emergency Department (HOSPITAL_COMMUNITY): Payer: Medicare Other

## 2013-03-05 ENCOUNTER — Inpatient Hospital Stay (HOSPITAL_COMMUNITY)
Admission: EM | Admit: 2013-03-05 | Discharge: 2013-03-08 | DRG: 291 | Disposition: A | Discharge status: Home Health | Payer: Medicare Other | Attending: Internal Medicine | Admitting: Internal Medicine

## 2013-03-05 ENCOUNTER — Encounter (HOSPITAL_COMMUNITY): Payer: Self-pay | Admitting: Emergency Medicine

## 2013-03-05 DIAGNOSIS — A498 Other bacterial infections of unspecified site: Secondary | ICD-10-CM | POA: Diagnosis present

## 2013-03-05 DIAGNOSIS — N289 Disorder of kidney and ureter, unspecified: Secondary | ICD-10-CM | POA: Diagnosis not present

## 2013-03-05 DIAGNOSIS — R000 Tachycardia, unspecified: Secondary | ICD-10-CM

## 2013-03-05 DIAGNOSIS — I5023 Acute on chronic systolic (congestive) heart failure: Principal | ICD-10-CM

## 2013-03-05 DIAGNOSIS — I5022 Chronic systolic (congestive) heart failure: Secondary | ICD-10-CM

## 2013-03-05 DIAGNOSIS — I4891 Unspecified atrial fibrillation: Secondary | ICD-10-CM

## 2013-03-05 DIAGNOSIS — I509 Heart failure, unspecified: Secondary | ICD-10-CM | POA: Diagnosis present

## 2013-03-05 DIAGNOSIS — T502X5A Adverse effect of carbonic-anhydrase inhibitors, benzothiadiazides and other diuretics, initial encounter: Secondary | ICD-10-CM | POA: Diagnosis not present

## 2013-03-05 DIAGNOSIS — R627 Adult failure to thrive: Secondary | ICD-10-CM | POA: Diagnosis present

## 2013-03-05 DIAGNOSIS — J449 Chronic obstructive pulmonary disease, unspecified: Secondary | ICD-10-CM

## 2013-03-05 DIAGNOSIS — D638 Anemia in other chronic diseases classified elsewhere: Secondary | ICD-10-CM | POA: Diagnosis present

## 2013-03-05 DIAGNOSIS — I70219 Atherosclerosis of native arteries of extremities with intermittent claudication, unspecified extremity: Secondary | ICD-10-CM

## 2013-03-05 DIAGNOSIS — H3530 Unspecified macular degeneration: Secondary | ICD-10-CM | POA: Diagnosis present

## 2013-03-05 DIAGNOSIS — Z7982 Long term (current) use of aspirin: Secondary | ICD-10-CM

## 2013-03-05 DIAGNOSIS — R748 Abnormal levels of other serum enzymes: Secondary | ICD-10-CM

## 2013-03-05 DIAGNOSIS — Z880 Allergy status to penicillin: Secondary | ICD-10-CM

## 2013-03-05 DIAGNOSIS — I498 Other specified cardiac arrhythmias: Secondary | ICD-10-CM | POA: Diagnosis present

## 2013-03-05 DIAGNOSIS — R7989 Other specified abnormal findings of blood chemistry: Secondary | ICD-10-CM

## 2013-03-05 DIAGNOSIS — E43 Unspecified severe protein-calorie malnutrition: Secondary | ICD-10-CM

## 2013-03-05 DIAGNOSIS — J4489 Other specified chronic obstructive pulmonary disease: Secondary | ICD-10-CM | POA: Diagnosis present

## 2013-03-05 DIAGNOSIS — I471 Supraventricular tachycardia, unspecified: Secondary | ICD-10-CM

## 2013-03-05 DIAGNOSIS — J9600 Acute respiratory failure, unspecified whether with hypoxia or hypercapnia: Secondary | ICD-10-CM

## 2013-03-05 DIAGNOSIS — R778 Other specified abnormalities of plasma proteins: Secondary | ICD-10-CM

## 2013-03-05 DIAGNOSIS — Z66 Do not resuscitate: Secondary | ICD-10-CM | POA: Diagnosis present

## 2013-03-05 DIAGNOSIS — N390 Urinary tract infection, site not specified: Secondary | ICD-10-CM

## 2013-03-05 DIAGNOSIS — J9610 Chronic respiratory failure, unspecified whether with hypoxia or hypercapnia: Secondary | ICD-10-CM

## 2013-03-05 DIAGNOSIS — M810 Age-related osteoporosis without current pathological fracture: Secondary | ICD-10-CM | POA: Diagnosis present

## 2013-03-05 DIAGNOSIS — F4323 Adjustment disorder with mixed anxiety and depressed mood: Secondary | ICD-10-CM | POA: Diagnosis present

## 2013-03-05 DIAGNOSIS — I10 Essential (primary) hypertension: Secondary | ICD-10-CM

## 2013-03-05 DIAGNOSIS — R799 Abnormal finding of blood chemistry, unspecified: Secondary | ICD-10-CM

## 2013-03-05 DIAGNOSIS — B9620 Unspecified Escherichia coli [E. coli] as the cause of diseases classified elsewhere: Secondary | ICD-10-CM

## 2013-03-05 DIAGNOSIS — Y9210 Unspecified residential institution as the place of occurrence of the external cause: Secondary | ICD-10-CM | POA: Diagnosis not present

## 2013-03-05 DIAGNOSIS — Z87891 Personal history of nicotine dependence: Secondary | ICD-10-CM

## 2013-03-05 DIAGNOSIS — I70209 Unspecified atherosclerosis of native arteries of extremities, unspecified extremity: Secondary | ICD-10-CM | POA: Diagnosis present

## 2013-03-05 DIAGNOSIS — J90 Pleural effusion, not elsewhere classified: Secondary | ICD-10-CM | POA: Diagnosis present

## 2013-03-05 DIAGNOSIS — I6529 Occlusion and stenosis of unspecified carotid artery: Secondary | ICD-10-CM | POA: Diagnosis present

## 2013-03-05 DIAGNOSIS — J9601 Acute respiratory failure with hypoxia: Secondary | ICD-10-CM | POA: Diagnosis present

## 2013-03-05 DIAGNOSIS — Z853 Personal history of malignant neoplasm of breast: Secondary | ICD-10-CM

## 2013-03-05 DIAGNOSIS — Z96649 Presence of unspecified artificial hip joint: Secondary | ICD-10-CM

## 2013-03-05 DIAGNOSIS — Z9010 Acquired absence of unspecified breast and nipple: Secondary | ICD-10-CM

## 2013-03-05 DIAGNOSIS — K224 Dyskinesia of esophagus: Secondary | ICD-10-CM | POA: Diagnosis present

## 2013-03-05 DIAGNOSIS — Z681 Body mass index (BMI) 19 or less, adult: Secondary | ICD-10-CM

## 2013-03-05 LAB — CBC WITH DIFFERENTIAL/PLATELET
BASOS ABS: 0 10*3/uL (ref 0.0–0.1)
Basophils Relative: 0 % (ref 0–1)
EOS PCT: 3 % (ref 0–5)
Eosinophils Absolute: 0.3 10*3/uL (ref 0.0–0.7)
HEMATOCRIT: 43.8 % (ref 36.0–46.0)
Hemoglobin: 14.1 g/dL (ref 12.0–15.0)
LYMPHS ABS: 2.4 10*3/uL (ref 0.7–4.0)
Lymphocytes Relative: 24 % (ref 12–46)
MCH: 29.3 pg (ref 26.0–34.0)
MCHC: 32.2 g/dL (ref 30.0–36.0)
MCV: 91.1 fL (ref 78.0–100.0)
MONO ABS: 0.5 10*3/uL (ref 0.1–1.0)
MONOS PCT: 5 % (ref 3–12)
NEUTROS ABS: 6.7 10*3/uL (ref 1.7–7.7)
Neutrophils Relative %: 67 % (ref 43–77)
Platelets: 258 10*3/uL (ref 150–400)
RBC: 4.81 MIL/uL (ref 3.87–5.11)
RDW: 14.4 % (ref 11.5–15.5)
WBC: 10 10*3/uL (ref 4.0–10.5)

## 2013-03-05 LAB — TROPONIN I
Troponin I: <0.3 ng/mL (ref <0.30)
Troponin I: <0.3 ng/mL (ref <0.30)
Troponin I: 0.32 ng/mL (ref <0.30)

## 2013-03-05 LAB — URINE MICROSCOPIC-ADD ON

## 2013-03-05 LAB — COMPREHENSIVE METABOLIC PANEL
ALT: 93 U/L — ABNORMAL HIGH (ref 0–35)
AST: 145 U/L — AB (ref 0–37)
Albumin: 3.6 g/dL (ref 3.5–5.2)
Alkaline Phosphatase: 142 U/L — ABNORMAL HIGH (ref 39–117)
BILIRUBIN TOTAL: 0.5 mg/dL (ref 0.3–1.2)
BUN: 32 mg/dL — AB (ref 6–23)
CALCIUM: 9 mg/dL (ref 8.4–10.5)
CHLORIDE: 104 meq/L (ref 96–112)
CO2: 22 meq/L (ref 19–32)
Creatinine, Ser: 1.12 mg/dL — ABNORMAL HIGH (ref 0.50–1.10)
GFR, EST AFRICAN AMERICAN: 52 mL/min — AB (ref >90)
GFR, EST NON AFRICAN AMERICAN: 45 mL/min — AB (ref >90)
Glucose, Bld: 221 mg/dL — ABNORMAL HIGH (ref 70–99)
Potassium: 3.7 mEq/L (ref 3.7–5.3)
Sodium: 143 mEq/L (ref 137–147)
Total Protein: 7.2 g/dL (ref 6.0–8.3)

## 2013-03-05 LAB — URINALYSIS, ROUTINE W REFLEX MICROSCOPIC
BILIRUBIN URINE: NEGATIVE
Glucose, UA: 250 mg/dL — AB
Ketones, ur: NEGATIVE mg/dL
Leukocytes, UA: NEGATIVE
Nitrite: NEGATIVE
PH: 6.5 (ref 5.0–8.0)
Specific Gravity, Urine: 1.015 (ref 1.005–1.030)
Urobilinogen, UA: 0.2 mg/dL (ref 0.0–1.0)

## 2013-03-05 LAB — CBC
HEMATOCRIT: 36.5 % (ref 36.0–46.0)
HEMOGLOBIN: 11.5 g/dL — AB (ref 12.0–15.0)
MCH: 28.5 pg (ref 26.0–34.0)
MCHC: 31.5 g/dL (ref 30.0–36.0)
MCV: 90.3 fL (ref 78.0–100.0)
Platelets: 214 10*3/uL (ref 150–400)
RBC: 4.04 MIL/uL (ref 3.87–5.11)
RDW: 14.4 % (ref 11.5–15.5)
WBC: 11 10*3/uL — ABNORMAL HIGH (ref 4.0–10.5)

## 2013-03-05 LAB — POCT I-STAT TROPONIN I: Troponin i, poc: 0.05 ng/mL (ref 0.00–0.08)

## 2013-03-05 LAB — POCT I-STAT 3, VENOUS BLOOD GAS (G3P V)
ACID-BASE DEFICIT: 3 mmol/L — AB (ref 0.0–2.0)
Bicarbonate: 26.2 mEq/L — ABNORMAL HIGH (ref 20.0–24.0)
O2 SAT: 22 %
PO2 VEN: 20 mmHg — AB (ref 30.0–45.0)
TCO2: 28 mmol/L (ref 0–100)
pCO2, Ven: 62.3 mmHg — ABNORMAL HIGH (ref 45.0–50.0)
pH, Ven: 7.233 — ABNORMAL LOW (ref 7.250–7.300)

## 2013-03-05 LAB — TSH: TSH: 0.654 u[IU]/mL (ref 0.350–4.500)

## 2013-03-05 LAB — MRSA PCR SCREENING: MRSA by PCR: POSITIVE — AB

## 2013-03-05 LAB — CREATININE, SERUM
Creatinine, Ser: 0.97 mg/dL (ref 0.50–1.10)
GFR calc non Af Amer: 53 mL/min — ABNORMAL LOW (ref >90)
GFR, EST AFRICAN AMERICAN: 62 mL/min — AB (ref >90)

## 2013-03-05 LAB — PROTIME-INR
INR: 1.15 (ref 0.00–1.49)
Prothrombin Time: 14.5 seconds (ref 11.6–15.2)

## 2013-03-05 LAB — MAGNESIUM: MAGNESIUM: 1.8 mg/dL (ref 1.5–2.5)

## 2013-03-05 LAB — CG4 I-STAT (LACTIC ACID): LACTIC ACID, VENOUS: 2.94 mmol/L — AB (ref 0.5–2.2)

## 2013-03-05 LAB — PRO B NATRIURETIC PEPTIDE: Pro B Natriuretic peptide (BNP): 12653 pg/mL — ABNORMAL HIGH (ref 0–450)

## 2013-03-05 LAB — APTT: APTT: 30 s (ref 24–37)

## 2013-03-05 MED ORDER — SODIUM CHLORIDE 0.9 % IJ SOLN
3.0000 mL | Freq: Two times a day (BID) | INTRAMUSCULAR | Status: DC
  Administered 2013-03-05 – 2013-03-07 (×4): 3 mL via INTRAVENOUS
  Administered 2013-03-07: 22:00:00 via INTRAVENOUS
  Administered 2013-03-08: 10:00:00 3 mL via INTRAVENOUS

## 2013-03-05 MED ORDER — MUPIROCIN 2 % EX OINT
1.0000 "application " | TOPICAL_OINTMENT | Freq: Two times a day (BID) | CUTANEOUS | Status: DC
  Administered 2013-03-05 – 2013-03-08 (×6): 1 via NASAL
  Filled 2013-03-05 (×2): qty 22

## 2013-03-05 MED ORDER — POTASSIUM CHLORIDE CRYS ER 20 MEQ PO TBCR
20.0000 meq | EXTENDED_RELEASE_TABLET | Freq: Every day | ORAL | Status: DC
  Administered 2013-03-05 – 2013-03-08 (×4): 20 meq via ORAL
  Filled 2013-03-05 (×4): qty 1

## 2013-03-05 MED ORDER — LEVALBUTEROL HCL 0.63 MG/3ML IN NEBU
0.6300 mg | INHALATION_SOLUTION | RESPIRATORY_TRACT | Status: DC | PRN
  Filled 2013-03-05: qty 3

## 2013-03-05 MED ORDER — ASPIRIN EC 81 MG PO TBEC
81.0000 mg | DELAYED_RELEASE_TABLET | Freq: Every day | ORAL | Status: DC
  Administered 2013-03-05 – 2013-03-08 (×4): 81 mg via ORAL
  Filled 2013-03-05 (×4): qty 1

## 2013-03-05 MED ORDER — SODIUM CHLORIDE 0.9 % IJ SOLN: 3.0000 mL | INTRAMUSCULAR | Status: DC | PRN

## 2013-03-05 MED ORDER — FUROSEMIDE 10 MG/ML IJ SOLN
60.0000 mg | Freq: Two times a day (BID) | INTRAMUSCULAR | Status: DC
  Administered 2013-03-05 – 2013-03-06 (×2): 60 mg via INTRAVENOUS
  Filled 2013-03-05 (×3): qty 6

## 2013-03-05 MED ORDER — ALBUTEROL SULFATE (2.5 MG/3ML) 0.083% IN NEBU
INHALATION_SOLUTION | RESPIRATORY_TRACT | Status: AC
  Administered 2013-03-05: 5 mg
  Filled 2013-03-05: qty 6

## 2013-03-05 MED ORDER — ALBUTEROL SULFATE HFA 108 (90 BASE) MCG/ACT IN AERS: 2.0000 | INHALATION_SPRAY | RESPIRATORY_TRACT | Status: DC | PRN

## 2013-03-05 MED ORDER — IPRATROPIUM BROMIDE 0.02 % IN SOLN: 0.5000 mg | RESPIRATORY_TRACT | Status: DC | PRN

## 2013-03-05 MED ORDER — IPRATROPIUM BROMIDE 0.02 % IN SOLN
RESPIRATORY_TRACT | Status: AC
  Administered 2013-03-05: 0.5 mg
  Filled 2013-03-05: qty 2.5

## 2013-03-05 MED ORDER — SERTRALINE HCL 25 MG PO TABS
25.0000 mg | ORAL_TABLET | Freq: Every day | ORAL | Status: DC
  Administered 2013-03-05 – 2013-03-08 (×4): 25 mg via ORAL
  Filled 2013-03-05 (×4): qty 1

## 2013-03-05 MED ORDER — ACETAMINOPHEN 325 MG PO TABS: 650.0000 mg | ORAL_TABLET | ORAL | Status: DC | PRN

## 2013-03-05 MED ORDER — LORATADINE 10 MG PO TABS
10.0000 mg | ORAL_TABLET | Freq: Every day | ORAL | Status: DC
  Administered 2013-03-05 – 2013-03-08 (×4): 10 mg via ORAL
  Filled 2013-03-05 (×4): qty 1

## 2013-03-05 MED ORDER — FUROSEMIDE 10 MG/ML IJ SOLN
40.0000 mg | Freq: Once | INTRAMUSCULAR | Status: AC
  Administered 2013-03-05: 40 mg via INTRAVENOUS
  Filled 2013-03-05: qty 4

## 2013-03-05 MED ORDER — ENSURE COMPLETE PO LIQD
237.0000 mL | Freq: Two times a day (BID) | ORAL | Status: DC
  Administered 2013-03-06 – 2013-03-08 (×6): 237 mL via ORAL
  Filled 2013-03-05: qty 237

## 2013-03-05 MED ORDER — PANTOPRAZOLE SODIUM 40 MG PO TBEC
40.0000 mg | DELAYED_RELEASE_TABLET | Freq: Every day | ORAL | Status: DC
Start: 2013-03-06 — End: 2013-03-08
  Administered 2013-03-06 – 2013-03-08 (×3): 40 mg via ORAL
  Filled 2013-03-05 (×3): qty 1

## 2013-03-05 MED ORDER — AMIODARONE HCL 200 MG PO TABS
200.0000 mg | ORAL_TABLET | Freq: Every day | ORAL | Status: DC
  Administered 2013-03-05 – 2013-03-08 (×4): 200 mg via ORAL
  Filled 2013-03-05 (×4): qty 1

## 2013-03-05 MED ORDER — ENOXAPARIN SODIUM 30 MG/0.3ML ~~LOC~~ SOLN
30.0000 mg | Freq: Every day | SUBCUTANEOUS | Status: DC
  Administered 2013-03-05 – 2013-03-07 (×3): 30 mg via SUBCUTANEOUS
  Filled 2013-03-05 (×4): qty 0.3

## 2013-03-05 MED ORDER — CHLORHEXIDINE GLUCONATE CLOTH 2 % EX PADS
6.0000 | MEDICATED_PAD | Freq: Every day | CUTANEOUS | Status: DC
  Administered 2013-03-06 – 2013-03-08 (×3): 6 via TOPICAL

## 2013-03-05 MED ORDER — LORAZEPAM 2 MG/ML IJ SOLN
0.5000 mg | Freq: Once | INTRAMUSCULAR | Status: DC
  Filled 2013-03-05: qty 1

## 2013-03-05 MED ORDER — SODIUM CHLORIDE 0.9 % IV SOLN: 250.0000 mL | INTRAVENOUS | Status: DC | PRN

## 2013-03-05 MED ORDER — METOPROLOL SUCCINATE ER 50 MG PO TB24
50.0000 mg | ORAL_TABLET | Freq: Every day | ORAL | Status: DC
  Administered 2013-03-06: 50 mg via ORAL
  Filled 2013-03-05 (×3): qty 1

## 2013-03-05 MED ORDER — FERROUS SULFATE 325 (65 FE) MG PO TABS
325.0000 mg | ORAL_TABLET | Freq: Every day | ORAL | Status: DC
  Administered 2013-03-06 – 2013-03-08 (×3): 325 mg via ORAL
  Filled 2013-03-05 (×4): qty 1

## 2013-03-05 MED ORDER — ENOXAPARIN SODIUM 30 MG/0.3ML ~~LOC~~ SOLN
20.0000 mg | SUBCUTANEOUS | Status: DC
  Filled 2013-03-05 (×2): qty 0.2

## 2013-03-05 MED ORDER — ONDANSETRON HCL 4 MG/2ML IJ SOLN: 4.0000 mg | Freq: Four times a day (QID) | INTRAMUSCULAR | Status: DC | PRN

## 2013-03-05 MED ORDER — DOCUSATE SODIUM 100 MG PO CAPS
300.0000 mg | ORAL_CAPSULE | Freq: Every day | ORAL | Status: DC
  Administered 2013-03-05 – 2013-03-08 (×4): 300 mg via ORAL
  Filled 2013-03-05 (×5): qty 3

## 2013-03-05 MED ORDER — ONDANSETRON HCL 4 MG/2ML IJ SOLN
4.0000 mg | Freq: Four times a day (QID) | INTRAMUSCULAR | Status: DC | PRN
  Administered 2013-03-06: 4 mg via INTRAVENOUS
  Filled 2013-03-05: qty 2

## 2013-03-05 NOTE — ED Notes (Signed)
hospitalist at the bedside 

## 2013-03-05 NOTE — ED Notes (Signed)
Per EMS, Patient began having SOB at 3am. Patient denies CP but states she has 'discomfort.' Pt has hx of asthma. Patient reports experiencing nausea. EMS gave 5mg  albuterol. BP 93/73.

## 2013-03-05 NOTE — ED Notes (Signed)
[name] with Critical Care at the bedside.
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
## 2013-03-05 NOTE — ED Notes (Signed)
Phlebotomy at the bedside  

## 2013-03-05 NOTE — ED Notes (Signed)
Meal tray ordered for patient.

## 2013-03-05 NOTE — ED Notes (Signed)
Pt switched from [facility] to [place] at 4 liters.
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
## 2013-03-05 NOTE — ED Notes (Signed)
Pt on phone with husband. sats drop while talking.
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
## 2013-03-06 DIAGNOSIS — I059 Rheumatic mitral valve disease, unspecified: Secondary | ICD-10-CM

## 2013-03-06 LAB — CBC
HCT: 37.4 % (ref 36.0–46.0)
Hemoglobin: 12 g/dL (ref 12.0–15.0)
MCH: 28.9 pg (ref 26.0–34.0)
MCHC: 32.1 g/dL (ref 30.0–36.0)
MCV: 90.1 fL (ref 78.0–100.0)
Platelets: 234 10*3/uL (ref 150–400)
RBC: 4.15 MIL/uL (ref 3.87–5.11)
RDW: 14.4 % (ref 11.5–15.5)
WBC: 9.6 10*3/uL (ref 4.0–10.5)

## 2013-03-06 LAB — HEPATIC FUNCTION PANEL
ALT: 65 U/L — ABNORMAL HIGH (ref 0–35)
AST: 54 U/L — ABNORMAL HIGH (ref 0–37)
Albumin: 3.1 g/dL — ABNORMAL LOW (ref 3.5–5.2)
Alkaline Phosphatase: 109 U/L (ref 39–117)
Bilirubin, Direct: <0.2 mg/dL (ref 0.0–0.3)
Total Bilirubin: 0.4 mg/dL (ref 0.3–1.2)
Total Protein: 6.3 g/dL (ref 6.0–8.3)

## 2013-03-06 LAB — BASIC METABOLIC PANEL
BUN: 26 mg/dL — AB (ref 6–23)
CALCIUM: 8.5 mg/dL (ref 8.4–10.5)
CO2: 27 mEq/L (ref 19–32)
CREATININE: 1.08 mg/dL (ref 0.50–1.10)
Chloride: 106 mEq/L (ref 96–112)
GFR calc non Af Amer: 47 mL/min — ABNORMAL LOW (ref >90)
GFR, EST AFRICAN AMERICAN: 54 mL/min — AB (ref >90)
Glucose, Bld: 85 mg/dL (ref 70–99)
POTASSIUM: 3.8 meq/L (ref 3.7–5.3)
Sodium: 146 mEq/L (ref 137–147)

## 2013-03-06 LAB — PRO B NATRIURETIC PEPTIDE: Pro B Natriuretic peptide (BNP): 29707 pg/mL — ABNORMAL HIGH (ref 0–450)

## 2013-03-06 MED ORDER — LISINOPRIL 5 MG PO TABS
5.0000 mg | ORAL_TABLET | Freq: Every day | ORAL | Status: DC
  Administered 2013-03-06 – 2013-03-08 (×3): 5 mg via ORAL
  Filled 2013-03-06 (×3): qty 1

## 2013-03-06 MED ORDER — FUROSEMIDE 40 MG PO TABS
40.0000 mg | ORAL_TABLET | Freq: Two times a day (BID) | ORAL | Status: DC
Start: 2013-03-06 — End: 2013-03-08
  Administered 2013-03-06 – 2013-03-08 (×4): 40 mg via ORAL
  Filled 2013-03-06 (×6): qty 1

## 2013-03-06 MED ORDER — DEXTROSE 5 % IV SOLN
1.0000 g | INTRAVENOUS | Status: DC
Start: 2013-03-06 — End: 2013-03-07
  Administered 2013-03-06 – 2013-03-07 (×2): 1 g via INTRAVENOUS
  Filled 2013-03-06 (×2): qty 10

## 2013-03-06 MED ORDER — FUROSEMIDE 10 MG/ML IJ SOLN
INTRAMUSCULAR | Status: AC
  Filled 2013-03-06: qty 8

## 2013-03-06 MED ORDER — ADULT MULTIVITAMIN W/MINERALS CH
1.0000 | ORAL_TABLET | Freq: Every day | ORAL | Status: DC
  Administered 2013-03-06 – 2013-03-08 (×3): 1 via ORAL
  Filled 2013-03-06 (×3): qty 1

## 2013-03-06 NOTE — Progress Notes (Signed)
Refused  for her  Foley catheter to be discontinued, claimed to have it done later, explained the risk of leaving the foley  In long period  and  claimed that she understand.

## 2013-03-06 NOTE — Progress Notes (Signed)
Transferred to 3 east room 13 by wheelchair, stable, report given to RN, belongings with pt. 
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
## 2013-03-06 NOTE — Plan of Care (Cosign Needed)
Urine cx positive for >100,000 colonies of E.Coli- Rocephin ordered- pt w/ N/V due to PCN so will try- has documented Cipro allergy.  Erin Hearing, ANP

## 2013-03-06 NOTE — Progress Notes (Signed)
Echo Lab  2D Echocardiogram completed.  [place], RDCS 03/06/2013 9:26 AM
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
## 2013-03-06 NOTE — Progress Notes (Signed)
Patient Name: [name] Date of Encounter: 03/06/2013  Principal Problem:   Acute respiratory failure Active Problems:   Acute on chronic systolic CHF (congestive heart failure), NYHA class 3   COPD (chronic obstructive pulmonary disease)   Hypertension   Severe protein-calorie malnutrition   Anemia due to chronic illness   Pleural effusion   Adjustment disorder with mixed anxiety and depressed mood   SVT (supraventricular tachycardia)   Elevated liver enzymes    SUBJECTIVE: Breathing much better, concerned about the sudden onset of symptoms, had not been weighing herself.   OBJECTIVE Filed Vitals:   03/06/13 0500 03/06/13 0600 03/06/13 0646 03/06/13 0700  BP:  140/79  120/58  Pulse: 82 90 91 85  Temp:    98.3 F (36.8 C)  TempSrc:    Oral  Resp: 19 20 24 22   Height:      Weight: 83 lb 8.9 oz (37.9 kg)     SpO2: 97% 97% 92% 88%    Intake/Output Summary (Last 24 hours) at 03/06/13 0758 Last data filed at 03/06/13 0600  Gross per 24 hour  Intake   1500 ml  Output   3325 ml  Net  -1825 ml   Filed Weights   03/05/13 1615 03/06/13 0500  Weight: 91 lb 7.9 oz (41.5 kg) 83 lb 8.9 oz (37.9 kg)    PHYSICAL EXAM General: Well developed, well nourished, female in no acute distress. Head: Normocephalic, atraumatic.  Neck: Supple without bruits, JVD at 8 cm. Lungs:  Resp regular and unlabored,  Decreased BS bases. Heart: RRR, S1, S2, no S3, S4, or murmur; no rub. Abdomen: Soft, non-tender, non-distended, BS + x 4.  Extremities: No clubbing, cyanosis, no edema.  Neuro: Alert and oriented X 3. Moves all extremities spontaneously. Psych: Normal affect.  LABS: CBC: Recent Labs  03/05/13 0705 03/05/13 1755 03/06/13 0226  WBC 10.0 11.0* 9.6  NEUTROABS 6.7  --   --   HGB 14.1 11.5* 12.0  HCT 43.8 36.5 37.4  MCV 91.1 90.3 90.1  PLT 258 214 234   INR: Recent Labs  03/05/13 1755  INR 7.51   Basic Metabolic Panel: Recent Labs  03/05/13 0705  03/05/13 1755 03/06/13 0226  NA 143  --  146  K 3.7  --  3.8  CL 104  --  106  CO2 22  --  27  GLUCOSE 221*  --  85  BUN 32*  --  26*  CREATININE 1.12* 0.97 1.08  CALCIUM 9.0  --  8.5  MG  --  1.8  --    Liver Function Tests: Recent Labs  03/05/13 0705 03/06/13 0226  AST 145* 54*  ALT 93* 65*  ALKPHOS 142* 109  BILITOT 0.5 0.4  PROT 7.2 6.3  ALBUMIN 3.6 3.1*   Cardiac Enzymes: Recent Labs  03/05/13 1011 03/05/13 1755 03/05/13 2100  TROPONINI <0.30 <0.30 0.32*    Recent Labs  03/05/13 0756  TROPIPOC 0.05   BNP: Pro B Natriuretic peptide (BNP)  Date/Time Value Range Status  03/06/2013  2:26 AM 29707.0* 0 - 450 pg/mL Final  03/05/2013  7:05 AM 12653.0* 0 - 450 pg/mL Final   Thyroid Function Tests: Recent Labs  03/05/13 1755  TSH 0.654   TELE:        ECG: 03/05/2013 Sinus Tach Vent. rate 142 BPM PR interval 159 ms QRS duration 98 ms QT/QTc 313/481 ms P-R-T axes 98 125 -11  Radiology/Studies: Dg Chest Portable 1 View  03/05/2013   CLINICAL DATA:  RESPIRATORY DISTRESS RESPIRATORY DISTRESS  EXAM: PORTABLE CHEST - 1 VIEW  COMPARISON:  DG RIBS W/ CHEST 3+V*R* dated 12/12/2011  FINDINGS: Stable enlarged cardiac silhouette. There is interval increase in diffuse fine airspace disease compared to prior. This is superimposed on emphysematous change in the lungs. Small bilateral pleural effusions present. No pneumothorax.  IMPRESSION: Diffuse airspace disease and effusions suggests congestive heart failure with pulmonary edema.   Electronically Signed   By: Suzy Bouchard M.D.   On: 03/05/2013 07:12     Current Medications:  . amiodarone  200 mg Oral Daily  . aspirin EC  81 mg Oral Daily  . Chlorhexidine Gluconate Cloth  6 each Topical Q0600  . docusate sodium  300 mg Oral Daily  . enoxaparin (LOVENOX) injection  30 mg Subcutaneous QHS  . feeding supplement (ENSURE COMPLETE)  237 mL Oral BID BM  . ferrous sulfate  325 mg Oral Q breakfast  . furosemide  60 mg  Intravenous Q12H  . loratadine  10 mg Oral Daily  . LORazepam  0.5 mg Intravenous Once  . metoprolol succinate  50 mg Oral Daily  . mupirocin ointment  1 application Nasal BID  . pantoprazole  40 mg Oral Q0600  . potassium chloride SA  20 mEq Oral Daily  . sertraline  25 mg Oral Daily  . sodium chloride  3 mL Intravenous Q12H      ASSESSMENT AND PLAN:   Acute on chronic systolic CHF (congestive heart failure), NYHA class 3 - Continue diuresis with IV Lasix today, possible change to PO in am. Symptoms much improved. Dry weight unclear. RF, K+ OK.     Abnormal LFTs - improving, possibly due to hepatic congestion    SVT (supraventricular tachycardia) - telemetry reviewed, ST seen, no SVT, follow    Hx atrial fib - on amio, maintaining SR. Anticoagulated with ASA only, secondary to balance problems, increased fall risk and pt has refused it in the past.  Principal Problem:   Acute respiratory failure Active Problems:   COPD (chronic obstructive pulmonary disease)   Hypertension   Severe protein-calorie malnutrition   Anemia due to chronic illness   Pleural effusion   Adjustment disorder with mixed anxiety and depressed mood   Elevated liver enzymes   Signed, Rosaria Ferries , PA-C 7:58 AM 03/06/2013  History and all data above reviewed.  Patient examined.  I agree with the findings as above.  The patient exam reveals is breathing much better.  COR:RRR  ,  Lungs: few basilar crackles  ,  Abd: Positive bowel sounds, no rebound no guarding, Ext No edema  .  All available labs, radiology testing, previous records reviewed. Agree with documented assessment and plan. Respiratory failure.  Breathing much better.  Given her size I am concerned about over diuresis.  I will change to PO diuretic tonight.  Transfer to telemetry.    Jeneen Rinks Charnese Federici  11:57 AM  03/06/2013

## 2013-03-06 NOTE — Progress Notes (Addendum)
[facility] Progress Note    Sole AMI [name]:[id number] DOB: Jun 24, 1929 DOA: 03/05/2013 PCP: Gerrit Heck, MD  Brief narrative: 80 year old female patient with history of COPD, chronic systolic heart failure, atrial fibrillation and known right subclavian occlusion. Presented to the emergency department with a 2 to three-day history of worsening shortness of breath manifested as paroxysmal nocturnal dyspnea around 3 AM every night. This is associated with midsternal to left sided chest tightness nonradiating. Did not improve with use of albuterol inhaler. Because of progressive symptoms patient called EMS. Apparently for a known reason she may have been noncompliant with some of her cardiac medications. In addition she had dietary indiscretion eating high sodium food. Upon arrival to the hospital due to the degree of respiratory distress she was placed on BiPAP and given a dose of Lasix IV with some improvement in her symptoms. She was able to transition to a nonrebreather mask. EKG was suspicious for supraventricular tachycardia but later was read by the cardiologist is sinus tachycardia. All other lab work was unremarkable except for elevated pro BNP of 12,653 and an elevated lactic acid level 2.94. A chest spray demonstrated diffuse airspace disease and effusions suggestive of CHF and pulmonary edema.  Assessment/Plan: Active Problems: Acute respiratory failure with hypoxia:   A) Acute on chronic systolic and diastolic CHF (congestive heart failure), NYHA class 3   B) Pleural effusion -Markedly improved after diuresis-continue IV Lasix at current dose for now or as recommended by cardiology (given patient's low body weight they have opted to change to oral dosing) -Appreciate cardiology assistance -2-D echocardiogram this admission reveals severely reduced systolic function with an EF of 20-25% with diffuse hypokinesis as well as grade 3 diastolic dysfunction; she  continues with a reversible restrictive pattern --this is a significant progression of her systolic heart failure noting that in 2013 her EF was 50-55% and her primary heart failure was consistent w/ grade 3 diastolic dysfunction -will add low dose Lisinopril    E. coli UTI -Empiric Rocephin -FU cx's    COPD (chronic obstructive pulmonary disease) -Currently not wheezing or exhibiting other signs of decompensation    Hypertension -Continue Toprol - add low dose Lisinopril- follow renal function    SVT?   H/o PAF -Clarified by cardiology that the supposed SVT was actually sinus tachycardia -Continue amiodarone - cont coumadin candidate due to fall risk    Severe protein-calorie malnutrition/Failure to thrive in adult -Continue ensure    Anemia due to chronic illness -Continue iron supplementation    Elevated liver enzymes -Mild elevation with normal total bilirubin likely related to cardiac hepatic congestion   DVT prophylaxis: Lovenox Code Status: DO NOT RESUSCITATE Family Communication: Family at bedside Disposition Plan/Expected LOS: Transfer to telemetry   Consultants: Cardiology  Procedures: 2-D echocardiogram - Left ventricle: The cavity size was normal. Wall thickness was normal. Systolic function was severely reduced. The estimated ejection fraction was in the range of 20% to 25%. Diffuse hypokinesis. Doppler parameters are consistent with a reversible restrictive pattern, indicative of decreased left ventricular diastolic compliance and/or increased left atrial pressure (grade 3 diastolic dysfunction). - Mitral valve: Mild regurgitation. - Left atrium: The atrium was mildly dilated. - Right atrium: The atrium was mildly dilated. - Atrial septum: No defect or patent foramen ovale was identified. - Pericardium, extracardiac: There was a left pleural effusion.   Antibiotics: Rocephin 1/15 >>>  HPI/Subjective: Awake and alert and endorsing overall  feeling much better. Again related issues with paroxysmal nocturnal dyspnea always  awakening her in 3 AM and associated chest tightness. Currently denies chest tightness or shortness of breath at rest.  Objective: Blood pressure 151/131, pulse 93, temperature 98.4 F (36.9 C), temperature source Oral, resp. rate 20, height 5\' 5"  (1.651 m), weight 83 lb 8.9 oz (37.9 kg), SpO2 91.00%.  Intake/Output Summary (Last 24 hours) at 03/06/13 1448 Last data filed at 03/06/13 1436  Gross per 24 hour  Intake    900 ml  Output   3200 ml  Net  -2300 ml     Exam: General: No acute respiratory distress at rest Lungs: Clear to auscultation bilaterally with fine bibasilar crackles, RA Cardiovascular: Regular rate and rhythm without murmur gallop or rub normal S1 and S2, no peripheral edema -5 cm JVD Abdomen: Nontender, nondistended, soft, bowel sounds positive, no rebound, no ascites, no appreciable mass Musculoskeletal: No significant cyanosis, clubbing of bilateral lower extremities Neurological: Alert and oriented x 3, moves all extremities x 4 without focal neurological deficits, CN 2-12 intact  Scheduled Meds:  Scheduled Meds: . amiodarone  200 mg Oral Daily  . aspirin EC  81 mg Oral Daily  . cefTRIAXone (ROCEPHIN)  IV  1 g Intravenous Q24H  . Chlorhexidine Gluconate Cloth  6 each Topical Q0600  . docusate sodium  300 mg Oral Daily  . enoxaparin (LOVENOX) injection  30 mg Subcutaneous QHS  . feeding supplement (ENSURE COMPLETE)  237 mL Oral BID BM  . ferrous sulfate  325 mg Oral Q breakfast  . furosemide  40 mg Oral BID  . loratadine  10 mg Oral Daily  . LORazepam  0.5 mg Intravenous Once  . metoprolol succinate  50 mg Oral Daily  . multivitamin with minerals  1 tablet Oral Daily  . mupirocin ointment  1 application Nasal BID  . pantoprazole  40 mg Oral Q0600  . potassium chloride SA  20 mEq Oral Daily  . sertraline  25 mg Oral Daily  . sodium chloride  3 mL Intravenous Q12H    Continuous Infusions:   **Reviewed in detail by the Attending Physician  Data Reviewed: Basic Metabolic Panel:  Recent Labs Lab 03/05/13 0705 03/05/13 1755 03/06/13 0226  NA 143  --  146  K 3.7  --  3.8  CL 104  --  106  CO2 22  --  27  GLUCOSE 221*  --  85  BUN 32*  --  26*  CREATININE 1.12* 0.97 1.08  CALCIUM 9.0  --  8.5  MG  --  1.8  --    Liver Function Tests:  Recent Labs Lab 03/05/13 0705 03/06/13 0226  AST 145* 54*  ALT 93* 65*  ALKPHOS 142* 109  BILITOT 0.5 0.4  PROT 7.2 6.3  ALBUMIN 3.6 3.1*   No results found for this basename: LIPASE, AMYLASE,  in the last 168 hours No results found for this basename: AMMONIA,  in the last 168 hours CBC:  Recent Labs Lab 03/05/13 0705 03/05/13 1755 03/06/13 0226  WBC 10.0 11.0* 9.6  NEUTROABS 6.7  --   --   HGB 14.1 11.5* 12.0  HCT 43.8 36.5 37.4  MCV 91.1 90.3 90.1  PLT 258 214 234   Cardiac Enzymes:  Recent Labs Lab 03/05/13 0705 03/05/13 1011 03/05/13 1755 03/05/13 2100  TROPONINI <0.30 <0.30 <0.30 0.32*   BNP (last 3 results)  Recent Labs  03/05/13 0705 03/06/13 0226  PROBNP 12653.0* 29707.0*   CBG: No results found for this basename: GLUCAP,  in the  last 168 hours  Recent Results (from the past 240 hour(s))  URINE CULTURE     Status: None   Collection Time    03/05/13  7:56 AM      Result Value Range Status   Specimen Description URINE, CATHETERIZED   Final   Special Requests NONE   Final   Culture  Setup Time     Final   Value: 03/05/2013 09:14     Performed at [facility]     Final   Value: >=100,000 COLONIES/ML     Performed at [facility]   Culture     Final   Value: ESCHERICHIA COLI     Performed at [facility]   Report Status PENDING   Incomplete  MRSA PCR SCREENING     Status: Abnormal   Collection Time    03/05/13  4:25 PM      Result Value Range Status   MRSA by PCR POSITIVE (*) NEGATIVE Final   Comment:            The  GeneXpert MRSA Assay (FDA     approved for NASAL specimens     only), is one component of a     comprehensive MRSA colonization     surveillance program. It is not     intended to diagnose MRSA     infection nor to guide or     monitor treatment for     MRSA infections.     CRITICAL RESULT CALLED TO, READ BACK BY AND VERIFIED WITH:     M.WILSON,RN AT 1950 BY L.PITT 03/05/13     Studies:  Recent x-ray studies have been reviewed in detail by the Attending Physician  Time spent :     Erin Hearing, [place] [facility] Office  [phone] Pager [phone]  **If unable to reach the above provider after paging please contact the [facility] @ [phone]  On-Call/Text Page:      [email or web address]      password [email or web address]  If 7PM-7AM, please contact night-coverage [email or web address] Password [facility] 03/06/2013, 2:48 PM   LOS: 1 day   I have examined the patient, reviewed the chart and modified the above note which I agree with.   Linnae Rasool,MD [phone] 03/06/2013, 5:30 PM

## 2013-03-07 DIAGNOSIS — A498 Other bacterial infections of unspecified site: Secondary | ICD-10-CM

## 2013-03-07 DIAGNOSIS — N390 Urinary tract infection, site not specified: Secondary | ICD-10-CM

## 2013-03-07 LAB — URINE CULTURE

## 2013-03-07 LAB — BASIC METABOLIC PANEL
BUN: 34 mg/dL — ABNORMAL HIGH (ref 6–23)
CALCIUM: 9 mg/dL (ref 8.4–10.5)
CO2: 26 meq/L (ref 19–32)
Chloride: 103 mEq/L (ref 96–112)
Creatinine, Ser: 1.42 mg/dL — ABNORMAL HIGH (ref 0.50–1.10)
GFR calc Af Amer: 39 mL/min — ABNORMAL LOW (ref >90)
GFR calc non Af Amer: 34 mL/min — ABNORMAL LOW (ref >90)
GLUCOSE: 81 mg/dL (ref 70–99)
Potassium: 3.8 mEq/L (ref 3.7–5.3)
Sodium: 141 mEq/L (ref 137–147)

## 2013-03-07 MED ORDER — METOPROLOL SUCCINATE ER 25 MG PO TB24: 25.0000 mg | ORAL_TABLET | Freq: Every day | ORAL | Status: DC

## 2013-03-07 MED ORDER — CEFUROXIME AXETIL 250 MG/5ML PO SUSR
250.0000 mg | Freq: Two times a day (BID) | ORAL | Status: DC
  Administered 2013-03-08: 10:00:00 250 mg via ORAL
  Filled 2013-03-07 (×2): qty 5

## 2013-03-07 MED ORDER — METOPROLOL SUCCINATE ER 25 MG PO TB24
25.0000 mg | ORAL_TABLET | Freq: Every day | ORAL | Status: DC
  Administered 2013-03-07 – 2013-03-08 (×2): 25 mg via ORAL
  Filled 2013-03-07 (×2): qty 1

## 2013-03-07 NOTE — Progress Notes (Signed)
Blood pressure taken multiple times in patients left and right arms while laying. Results ranging anywhere from 69-74 with SBP. Last result- 75/40. Patient is asymptomatic with low BP and has no complaints. Patient denies dizziness, lightheadedness, extreme weakness and diaphoresis. Dr. Dyann Kief on unit and made aware. MD to review BP medications and assess patient.

## 2013-03-07 NOTE — Evaluation (Signed)
Physical Therapy Evaluation/ Discharge Patient Details Name: [name] MRN: [id number] DOB: 1929-03-10 Today's Date: 03/07/2013 Time: 8185-6314 PT Time Calculation (min): 24 min  PT Assessment / Plan / Recommendation History of Present Illness  With past medical history of COPD not on home O2 currently, history of chronic systolic heart failure EF of 50-55% with possible moderate hypokinesis of the basal midinferior myocardium per 2-D echo of 10/12/2011, atrial fibrillation/atrial flutter, peripheral vascular disease status post bilateral iliac stenting in 2012 and right upper extremity ischemia #2013 with right subclavian occlusion treated conservatively, who presents to the ED with a 2 to three-day history of worsening shortness of breath which awakens patient around 3 AM in the morning  Clinical Impression  Pt very pleasant and moving well. Pt able to perform transfers, gait and selfcare without assist. Pt at baseline functional level without SOB throughout eval. Pt encouraged to continue ambulating daily with staff awareness and to continue at home with a daily exercise plan. No further therapy needs, pt aware and agreeable.     PT Assessment  Patent does not need any further PT services    Follow Up Recommendations  No PT follow up    Does the patient have the potential to tolerate intense rehabilitation      Barriers to Discharge        Equipment Recommendations  None recommended by PT    Recommendations for Other Services     Frequency      Precautions / Restrictions Precautions Precautions: Fall   Pertinent Vitals/Pain No pain BP 82/38 (52) sitting 83/52 (60) standing Pt remains asymptomatic and no SOB      Mobility  Bed Mobility Overal bed mobility: Modified Independent Transfers Overall transfer level: Modified independent Ambulation/Gait Ambulation/Gait assistance: Modified independent (Device/Increase time) Ambulation Distance (Feet): 200  Feet Assistive device: Straight cane Gait Pattern/deviations: Step-through pattern;Decreased stride length Gait velocity: decreased speed for pt safety with gait Gait velocity interpretation: Below normal speed for age/gender General Gait Details: pt with controlled gait without LOB with gait    Exercises     PT Diagnosis:    PT Problem List:   PT Treatment Interventions:       PT Goals(Current goals can be found in the care plan section) Acute Rehab PT Goals PT Goal Formulation: No goals set, d/c therapy  Visit Information  Last PT Received On: 03/07/13 Assistance Needed: +1 History of Present Illness: With past medical history of COPD not on home O2 currently, history of chronic systolic heart failure EF of 50-55% with possible moderate hypokinesis of the basal midinferior myocardium per 2-D echo of 10/12/2011, atrial fibrillation/atrial flutter, peripheral vascular disease status post bilateral iliac stenting in 2012 and right upper extremity ischemia #2013 with right subclavian occlusion treated conservatively, who presents to the ED with a 2 to three-day history of worsening shortness of breath which awakens patient around 3 AM in the morning       Prior Functioning  Home Living Family/patient expects to be discharged to:: Private residence Living Arrangements: Alone Type of Home: House Home Access: Stairs to enter [employer] of Steps: 4 Entrance Stairs-Rails: Can reach both;Left;Right Home Layout: One level Home Equipment: Cane - single point;Shower seat - built in;Toilet riser Prior Function Level of Independence: Independent with assistive device(s) Comments: spouse in SNF after CVA, pt has assist for transportation due to limited vision. Pt able to perform ADLs and limited housework on her own and states she hasn't cooked much since being  by herself. Pt also states no falls in the last year Communication Communication: No difficulties    Cognition   Cognition Arousal/Alertness: Awake/alert Behavior During Therapy: WFL for tasks assessed/performed Overall Cognitive Status: Within Functional Limits for tasks assessed    Extremity/Trunk Assessment Upper Extremity Assessment Upper Extremity Assessment: Generalized weakness Lower Extremity Assessment Lower Extremity Assessment: Generalized weakness Cervical / Trunk Assessment Cervical / Trunk Assessment: Normal   Balance    End of Session PT - End of Session Activity Tolerance: Patient tolerated treatment well Patient left: in chair;with call bell/phone within reach;with nursing/sitter in room Nurse Communication: Mobility status  GP     Melford Aase 03/07/2013, 2:15 PM Elwyn Reach, [place]

## 2013-03-07 NOTE — Progress Notes (Signed)
Foley catheter removed per protocol.

## 2013-03-07 NOTE — Progress Notes (Signed)
   SUBJECTIVE:  She says that she is breathing at baseline.   PHYSICAL EXAM Filed Vitals:   03/06/13 1200 03/06/13 1614 03/06/13 2048 03/07/13 0446  BP: 151/131 110/45 102/66 108/51  Pulse: 93 81 86 92  Temp:  97.6 F (36.4 C) 97.8 F (36.6 C) 97.5 F (36.4 C)  TempSrc:  Oral Oral Oral  Resp: 20 18 18 18   Height:  5' (1.524 m)    Weight:  86 lb 10.3 oz (39.3 kg)  87 lb 9.6 oz (39.735 kg)  SpO2: 91% 93% 93% 91%   General:  No distress Lungs:  Good breath sounds Heart:  RRR Abdomen:  Positive bowel sounds, no rebound no guarding Extremities:  No edema  LABS:  Results for orders placed during the hospital encounter of 03/05/13 (from the past 24 hour(s))  BASIC METABOLIC PANEL     Status: Abnormal   Collection Time    03/07/13  5:00 AM      Result Value Range   Sodium 141  137 - 147 mEq/L   Potassium 3.8  3.7 - 5.3 mEq/L   Chloride 103  96 - 112 mEq/L   CO2 26  19 - 32 mEq/L   Glucose, Bld 81  70 - 99 mg/dL   BUN 34 (*) 6 - 23 mg/dL   Creatinine, Ser 1.42 (*) 0.50 - 1.10 mg/dL   Calcium 9.0  8.4 - 10.5 mg/dL   GFR calc non Af Amer 34 (*) >90 mL/min   GFR calc Af Amer 39 (*) >90 mL/min    Intake/Output Summary (Last 24 hours) at 03/07/13 0843 Last data filed at 03/07/13 0446  Gross per 24 hour  Intake   2540 ml  Output      0 ml  Net   2540 ml     ASSESSMENT AND PLAN:  ACUTE RESPIRATORY FAILURE:  See below.  ACUTE SYSTOLIC HF:     EF 03% with diffuse hypokinesis.  Last echo was 50% but prior to this it was 25%.  Changed to oral lasix yesterday.  She had an aggressive diuresis on IV for a little lady. Her creat is up today.  I/O not complete as she was transferred between units.  However, symptomatically she is better.  Dr. Marlou Porch will need to review the echoes to see if the fluctuation in EF has been as dramatic as is reported.  Regardless, continued medical management is in order with compliance to be stressed.  I don't think that invasive evaluation is in order.   Noninvasive evaluation can be considered as an out patient.   Continue current therapy.   ATRIAL FIB:  NSR.  Continue amiodarone.    PVD:  Medical management.    Jeneen Rinks [facility] 03/07/2013 8:43 AM

## 2013-03-07 NOTE — Progress Notes (Signed)
Patients BP now 84/65 while sitting on side of bed. Patient still asymptomatic with no complaints.

## 2013-03-07 NOTE — Progress Notes (Signed)
[facility] Progress Note    [name] KYH:[id number] DOB: Aug 18, 1929 DOA: 03/05/2013 PCP: Gerrit Heck, MD  Assessment/Plan: Acute respiratory failure with hypoxia:   A) Acute on chronic systolic and diastolic CHF (congestive heart failure), NYHA class 3   B) Pleural effusion -Markedly improved after diuresis -Cr wnet up with diuresis -lasix switch to PO -Appreciate cardiology assistance -2-D echocardiogram this admission reveals severely reduced systolic function with an EF of 20-25% with diffuse hypokinesis as well as grade 3 diastolic dysfunction; she continues with a reversible restrictive pattern --this is a significant progression of her systolic heart failure noting that in 2013 her EF was 50-55% and her primary heart failure was consistent w/ grade 3 diastolic dysfunction -will cont low dose Lisinopril    E. coli UTI -will treat with ceftin and complete 7 days total of abx's    COPD (chronic obstructive pulmonary disease) -Currently not wheezing or exhibiting other signs of decompensation -good air movement and O2 sat on RA    Hypertension -Continue Toprol - add low dose Lisinopril- follow renal function    SVT? / H/o PAF -Clarified by cardiology that the supposed SVT was actually sinus tachycardia -Continue amiodarone -cont holding coumadin due to personal preferennce and balance issues -continue ASA -SR now    Severe protein-calorie malnutrition/Failure to thrive in adult -Continue ensure -patient encourage to follow good PO intake    Anemia due to chronic illness -Continue iron supplementation -no signs of bleeding    Elevated liver enzymes -Mild elevation with normal total bilirubin likely related to cardiac hepatic congestion -continue improving.   DVT prophylaxis: Lovenox Code Status: DO NOT RESUSCITATE Family Communication: no family at bedside Disposition Plan/Expected LOS: home when medically stable (most likely  tomorrow)   Consultants: Cardiology  Procedures: 2-D echocardiogram - Left ventricle: The cavity size was normal. Wall thickness was normal. Systolic function was severely reduced. The estimated ejection fraction was in the range of 20% to 25%. Diffuse hypokinesis. Doppler parameters are consistent with a reversible restrictive pattern, indicative of decreased left ventricular diastolic compliance and/or increased left atrial pressure (grade 3 diastolic dysfunction). - Mitral valve: Mild regurgitation. - Left atrium: The atrium was mildly dilated. - Right atrium: The atrium was mildly dilated. - Atrial septum: No defect or patent foramen ovale was identified. - Pericardium, extracardiac: There was a left pleural effusion.   Antibiotics: Rocephin 1/15 >>>1/16 cefting with plan for 5 more days treatment  HPI/Subjective: Awake and alert and endorsing overall feeling much better. No CP or SOB.  Objective: Blood pressure 84/65, pulse 70, temperature 97.7 F (36.5 C), temperature source Oral, resp. rate 18, height 5' (1.524 m), weight 39.735 kg (87 lb 9.6 oz), SpO2 95.00%.  Intake/Output Summary (Last 24 hours) at 03/07/13 1203 Last data filed at 03/07/13 1201  Gross per 24 hour  Intake   3017 ml  Output      0 ml  Net   3017 ml    Exam: General: No acute respiratory distress  Lungs: Clear to auscultation bilaterally without crackles or rales.  Good O2 sat on RA Cardiovascular: Regular rate and rhythm without murmur gallop or rub normal S1 and S2, no peripheral edema, minimally JVD on exam Abdomen: Nontender, nondistended, soft, bowel sounds positive, no rebound, no ascites, no appreciable mass Musculoskeletal: No significant cyanosis, clubbing of bilateral lower extremities Neurological: Alert and oriented x 3, moves all extremities x 4 without focal neurological deficits, CN 2-12 intact  Scheduled Meds:  Scheduled Meds: .  amiodarone  200 mg Oral Daily  . aspirin EC   81 mg Oral Daily  . [START ON 03/08/2013] cefUROXime  250 mg Oral Q12H  . Chlorhexidine Gluconate Cloth  6 each Topical Q0600  . docusate sodium  300 mg Oral Daily  . enoxaparin (LOVENOX) injection  30 mg Subcutaneous QHS  . feeding supplement (ENSURE COMPLETE)  237 mL Oral BID BM  . ferrous sulfate  325 mg Oral Q breakfast  . furosemide  40 mg Oral BID  . lisinopril  5 mg Oral Daily  . loratadine  10 mg Oral Daily  . LORazepam  0.5 mg Intravenous Once  . [START ON 03/08/2013] metoprolol succinate  25 mg Oral Daily  . multivitamin with minerals  1 tablet Oral Daily  . mupirocin ointment  1 application Nasal BID  . pantoprazole  40 mg Oral Q0600  . potassium chloride SA  20 mEq Oral Daily  . sertraline  25 mg Oral Daily  . sodium chloride  3 mL Intravenous Q12H   Continuous Infusions:   Data Reviewed: Basic Metabolic Panel:  Recent Labs Lab 03/05/13 0705 03/05/13 1755 03/06/13 0226 03/07/13 0500  NA 143  --  146 141  K 3.7  --  3.8 3.8  CL 104  --  106 103  CO2 22  --  27 26  GLUCOSE 221*  --  85 81  BUN 32*  --  26* 34*  CREATININE 1.12* 0.97 1.08 1.42*  CALCIUM 9.0  --  8.5 9.0  MG  --  1.8  --   --    Liver Function Tests:  Recent Labs Lab 03/05/13 0705 03/06/13 0226  AST 145* 54*  ALT 93* 65*  ALKPHOS 142* 109  BILITOT 0.5 0.4  PROT 7.2 6.3  ALBUMIN 3.6 3.1*   CBC:  Recent Labs Lab 03/05/13 0705 03/05/13 1755 03/06/13 0226  WBC 10.0 11.0* 9.6  NEUTROABS 6.7  --   --   HGB 14.1 11.5* 12.0  HCT 43.8 36.5 37.4  MCV 91.1 90.3 90.1  PLT 258 214 234   Cardiac Enzymes:  Recent Labs Lab 03/05/13 0705 03/05/13 1011 03/05/13 1755 03/05/13 2100  TROPONINI <0.30 <0.30 <0.30 0.32*   BNP (last 3 results)  Recent Labs  03/05/13 0705 03/06/13 0226  PROBNP 12653.0* 69629.5*    Recent Results (from the past 240 hour(s))  URINE CULTURE     Status: None   Collection Time    03/05/13  7:56 AM      Result Value Range Status   Specimen Description  URINE, CATHETERIZED   Final   Special Requests NONE   Final   Culture  Setup Time     Final   Value: 03/05/2013 09:14     Performed at [facility]     Final   Value: >=100,000 COLONIES/ML     Performed at [facility]   Culture     Final   Value: ESCHERICHIA COLI     Performed at [facility]   Report Status 03/07/2013 FINAL   Final   Organism ID, Bacteria ESCHERICHIA COLI   Final  MRSA PCR SCREENING     Status: Abnormal   Collection Time    03/05/13  4:25 PM      Result Value Range Status   MRSA by PCR POSITIVE (*) NEGATIVE Final   Comment:            The GeneXpert MRSA Assay (  FDA     approved for NASAL specimens     only), is one component of a     comprehensive MRSA colonization     surveillance program. It is not     intended to diagnose MRSA     infection nor to guide or     monitor treatment for     MRSA infections.     CRITICAL RESULT CALLED TO, READ BACK BY AND VERIFIED WITH:     M.WILSON,RN AT 1950 BY L.PITT 03/05/13      LOS: 2 days    Trexton Escamilla,MD [id number] 03/07/2013, 12:03 PM

## 2013-03-08 DIAGNOSIS — E43 Unspecified severe protein-calorie malnutrition: Secondary | ICD-10-CM

## 2013-03-08 LAB — BASIC METABOLIC PANEL
BUN: 49 mg/dL — AB (ref 6–23)
CO2: 27 meq/L (ref 19–32)
Calcium: 9 mg/dL (ref 8.4–10.5)
Chloride: 103 mEq/L (ref 96–112)
Creatinine, Ser: 1.52 mg/dL — ABNORMAL HIGH (ref 0.50–1.10)
GFR calc Af Amer: 36 mL/min — ABNORMAL LOW (ref >90)
GFR, EST NON AFRICAN AMERICAN: 31 mL/min — AB (ref >90)
GLUCOSE: 89 mg/dL (ref 70–99)
POTASSIUM: 4 meq/L (ref 3.7–5.3)
Sodium: 143 mEq/L (ref 137–147)

## 2013-03-08 MED ORDER — LISINOPRIL 5 MG PO TABS: 5.0000 mg | ORAL_TABLET | Freq: Every day | ORAL | Status: DC

## 2013-03-08 MED ORDER — ENSURE COMPLETE PO LIQD: 237.0000 mL | Freq: Two times a day (BID) | ORAL | Status: DC

## 2013-03-08 MED ORDER — FUROSEMIDE 40 MG PO TABS: 40.0000 mg | ORAL_TABLET | Freq: Every day | ORAL | Status: DC

## 2013-03-08 MED ORDER — CEFUROXIME AXETIL 250 MG PO TABS: 250.0000 mg | ORAL_TABLET | Freq: Two times a day (BID) | ORAL | Status: DC

## 2013-03-08 NOTE — Progress Notes (Signed)
DC IV, DC Tele, DC Home. Discharge instructions and home medications discussed with patient. Patient denied any questions or concerns at this time. Patient leaving unit via wheelchair and appears in no acute distress.  

## 2013-03-08 NOTE — Progress Notes (Signed)
BP 92/48 and is due for morning meds. No signs or symptoms of distress or verbal complaints.  PA notified. Ok to give medication.

## 2013-03-08 NOTE — Progress Notes (Signed)
    SUBJECTIVE:  She says that she is breathing at baseline.   PHYSICAL EXAM Filed Vitals:   03/07/13 2147 03/08/13 0537 03/08/13 1005 03/08/13 1026  BP: 87/49 104/59 92/48 92/48   Pulse: 77 68 70 70  Temp: 98.1 F (36.7 C)     TempSrc: Oral     Resp: 18 17    Height:      Weight:  88 lb 2.9 oz (40 kg)    SpO2: 99% 92%     General:  No distress Lungs:  Good breath sounds Heart:  RRR Abdomen:  Positive bowel sounds, no rebound no guarding Extremities:  No edema  LABS:  Results for orders placed during the hospital encounter of 03/05/13 (from the past 24 hour(s))  BASIC METABOLIC PANEL     Status: Abnormal   Collection Time    03/08/13  5:00 AM      Result Value Range   Sodium 143  137 - 147 mEq/L   Potassium 4.0  3.7 - 5.3 mEq/L   Chloride 103  96 - 112 mEq/L   CO2 27  19 - 32 mEq/L   Glucose, Bld 89  70 - 99 mg/dL   BUN 49 (*) 6 - 23 mg/dL   Creatinine, Ser 1.52 (*) 0.50 - 1.10 mg/dL   Calcium 9.0  8.4 - 10.5 mg/dL   GFR calc non Af Amer 31 (*) >90 mL/min   GFR calc Af Amer 36 (*) >90 mL/min    Intake/Output Summary (Last 24 hours) at 03/08/13 1247 Last data filed at 03/08/13 1014  Gross per 24 hour  Intake    820 ml  Output    200 ml  Net    620 ml     ASSESSMENT AND PLAN:  ACUTE RESPIRATORY FAILURE:  See below.  ACUTE SYSTOLIC HF:     EF 78% with diffuse hypokinesis.  Last echo was 50% but prior to this it was 25%.  Changed to oral lasix yesterday.  She had an aggressive diuresis on IV for a little lady. Her creat is up today.  I/O not complete as she was transferred between units.  However, symptomatically she is better.  Dr. Marlou Porch will need to review the echoes to see if the fluctuation in EF has been as dramatic as is reported.  I would suggest discharge on Lasix 40 mg daily and she will need a BMET in our office next week.  I will send a note to Dr. Marlou Porch to arrange labs and a follwoup.    ATRIAL FIB:  NSR.  Continue amiodarone.    PVD:  Medical  management.    Jeneen Rinks [facility] 03/08/2013 12:47 PM

## 2013-03-08 NOTE — Discharge Summary (Signed)
Physician Discharge Summary  [name] [id number] DOB: [birth year] DOA: 03/05/2013  PCP: Gerrit Heck, MD  Admit date: 03/05/2013 Discharge date: 03/08/2013  Time spent: >30 minutes  Recommendations for Outpatient Follow-up:  1. CMET to follow liver enzymes, electrolytes and renal function 2. Reassess BP and adjust meds as needed 3. Cardiology to provide further rec's for heart failure; most likely repeat echo in 3 months if not improvement will need ICD.  Discharge Diagnoses:  Active Problems:   Acute on chronic systolic CHF (congestive heart failure), NYHA class 4   E. coli UTI   COPD (chronic obstructive pulmonary disease)   Hypertension   Acute respiratory failure with hypoxia   Severe protein-calorie malnutrition   Failure to thrive in adult   Atrial fibrillation   Anemia due to chronic illness   Pleural effusion   SVT (supraventricular tachycardia)   Elevated liver enzymes   Discharge Condition: stable and improved. Will be discharge home. Follow up with PCP in 2 weeks and follow up with cardiology in 1 week.  Diet recommendation: low sodium heart healthy diet  Filed Weights   03/06/13 1614 03/07/13 0446 03/08/13 0537  Weight: 39.3 kg (86 lb 10.3 oz) 39.735 kg (87 lb 9.6 oz) 40 kg (88 lb 2.9 oz)    History of present illness:  80 y.o. female  With past medical history of COPD not on home O2 currently, history of chronic systolic heart failure EF of 50-55% with possible moderate hypokinesis of the basal midinferior myocardium per 2-D echo of 10/12/2011, atrial fibrillation/atrial flutter, peripheral vascular disease status post bilateral iliac stenting in 2012 and right upper extremity ischemia #2013 with right subclavian occlusion treated conservatively, who presents to the ED with a 2 to three-day history of worsening shortness of breath which awakens patient around 3 AM in the morning. Patient stated that for the past 2-3 days has been having some  midsternal to left-sided chest tightness nonradiating in nature with shortness of breath which awakens around 3 AM and some paroxysmal nocturnal dyspnea. Patient stated she tried albuterol inhaler 2-3 days ago with some improvement however on the day of admission shortness of breath worsened albuterol inhaler had no improvement in his symptoms she did have chest tightness which occurred at 3 AM and subsequently called 911. Patient was given albuterol nebulizer x1. EMS with mild improvement in the symptoms. Patient endorses some nausea, diaphoresis, constipation, generalized weakness. Patient denies any fevers, no chills, no emesis, no abdominal pain, no diarrhea, no dysuria, no syncope, no palpitations. Patient also states that one week prior to admission meds for couple of days of her 3 white heart pills. Patient also stated had some dietary indiscretion and 8 a hamburger yesterday. In the ED due to patient's respiratory distress was initially placed on the BiPAP and given a dose of Lasix 40 mg IV x1 with some improvement in her shortness of breath. Patient currently on a nonrebreather. EKG done showed a supraventricular tachycardia. CBC done was within normal limits. Compressive metabolic profile done at a glucose of 221 AST of 145 ALT of 93 out phosphatase of 142 otherwise was within normal limits. I-STAT troponin was negative. [place] [address]. Venous blood gas which was obtained and a pH of 7.23 PCO2 of 62 PCO2 of 20 and a bicarbonate of 26. Lactic acid level was elevated at 2.94. Chest x-ray showed diffuse airspace disease and effusions suggestive of CHF with pulmonary edema.  We were called to admit the patient for  further evaluation and management.   Hospital Course:  Acute respiratory failure with hypoxia:  A) Acute on chronic systolic and diastolic CHF (congestive heart failure), NYHA class 3  B) Pleural effusion  -Markedly improved after diuresis  -Cr wnet up with diuresis  -lasix  switch to PO (40mg  daily) -Appreciate cardiology assistance  -2-D echocardiogram this admission reveals severely reduced systolic function with an EF of 20-25% with diffuse hypokinesis as well as grade 3 diastolic dysfunction; she continues with a reversible restrictive pattern  (this is a significant progression of her systolic heart failure noting that in 2013 her EF was 50-55% and her primary heart failure was consistent w/ grade 3 diastolic dysfunction)  -will cont low dose Lisinopril   E. coli UTI  -will treat with ceftin and complete 7 days total of abx's   COPD (chronic obstructive pulmonary disease)  -Currently not wheezing or exhibiting other signs of decompensation  -good air movement and O2 sat on RA   Hypertension  -Continue Toprol  - add low dose Lisinopril - follow renal function   SVT? / H/o PAF  -Clarified by cardiology that supposed SVT was actually sinus tachycardia  -Continue amiodarone  -cont holding coumadin due to personal preferennce and balance issues  -continue ASA  -SR now and rate controlled  Severe protein-calorie malnutrition/Failure to thrive in adult  -Continue ensure  -patient encourage to follow good PO intake   Anemia due to chronic illness  -Continue iron supplementation  -no signs of bleeding   Elevated liver enzymes  -Mild elevation with normal total bilirubin likely related to cardiac hepatic congestion  -continue improving. -CMET as an outpatient  Acute renal insufficiency: due to diuresis and UTI. -follow renal function as an outpatient -Cr at discharge 1.5 -lasix dose adjusted by cardiology   Procedures: 2-D echocardiogram  - Left ventricle: The cavity size was normal. Wall thickness was normal. Systolic function was severely reduced. The estimated ejection fraction was in the range of 20% to 25%. Diffuse hypokinesis. Doppler parameters are consistent with a reversible restrictive pattern, indicative of decreased left  ventricular diastolic compliance and/or increased left atrial pressure (grade 3 diastolic dysfunction). - Mitral valve: Mild regurgitation. - Left atrium: The atrium was mildly dilated. - Right atrium: The atrium was mildly dilated. - Atrial septum: No defect or patent foramen ovale was identified. - Pericardium, extracardiac: There was a left pleural effusion.   Consultations:  Cardiology  Discharge Exam: Filed Vitals:   03/08/13 1026  BP: 92/48  Pulse: 70  Temp:   Resp:    General: No distress  Lungs: Good breath sounds  Heart: RRR, no JVD, no rubs or gallops Abdomen: Positive bowel sounds, no rebound no guarding  Extremities: No edema   Discharge Instructions  Discharge Orders   Future Appointments Provider Department Dept Phone   03/19/2013 10:15 AM Blanchie Serve, MD [place] [phone]   05/13/2013 1:00 PM [facility] [place] [phone]   05/13/2013 2:00 PM [facility] [place] [id number]   05/13/2013 2:30 PM Rosetta Posner, MD [facility] [phone]   Future Orders Complete By Expires   Diet - low sodium heart healthy  As directed    Discharge instructions  As directed    Comments:     Take medications as prescribed Follow up with Dr. Marlou Porch in 1 week Follow a low sodium diet (less than 2 Grams daily) Daily weight and close follow up  to amount of fluid intake       Medication List         albuterol 108 (90 BASE) MCG/ACT inhaler  Commonly known as:  PROVENTIL HFA;VENTOLIN HFA  Inhale 2 puffs into the lungs every 6 (six) hours as needed. Wheezing     amiodarone 200 MG tablet  Commonly known as:  PACERONE  Take 200 mg by mouth daily.     aspirin EC 81 MG tablet  Take 81 mg by mouth daily.     CALCIUM 600 + D PO  Take 1 tablet by mouth daily.     cefUROXime 250 MG tablet  Commonly known as:  CEFTIN  Take 1 tablet (250 mg total) by mouth 2  (two) times daily with a meal.     cetirizine 10 MG tablet  Commonly known as:  ZYRTEC  Take 10 mg by mouth daily.     docusate sodium 100 MG capsule  Commonly known as:  COLACE  Take 300 mg by mouth daily.     feeding supplement (ENSURE COMPLETE) Liqd  Take 237 mLs by mouth 2 (two) times daily between meals.     ferrous sulfate 325 (65 FE) MG tablet  Take 325 mg by mouth daily with breakfast.     furosemide 40 MG tablet  Commonly known as:  LASIX  Take 1 tablet (40 mg total) by mouth daily.     ICAPS Caps  Take 1 capsule by mouth 2 (two) times daily.     KRILL OIL PO  Take 1 capsule by mouth daily.     lisinopril 5 MG tablet  Commonly known as:  PRINIVIL,ZESTRIL  Take 1 tablet (5 mg total) by mouth daily.     metoprolol succinate 50 MG 24 hr tablet  Commonly known as:  TOPROL-XL  Take 50 mg by mouth daily. Take with or immediately following a meal.     omeprazole 20 MG capsule  Commonly known as:  PRILOSEC  Take 20 mg by mouth daily.     potassium chloride SA 20 MEQ tablet  Commonly known as:  K-DUR,KLOR-CON  Take 20 mEq by mouth daily.     sertraline 25 MG tablet  Commonly known as:  ZOLOFT  Take 1 tab daily     vitamin C 1000 MG tablet  Take 1,000 mg by mouth daily.       Allergies  Allergen Reactions  . Ciprofloxacin     SOB  . Codeine Nausea Only  . Morphine And Related Nausea And Vomiting  . Penicillins Nausea And Vomiting  . Sodium Pentobarbital [Pentobarbital Sodium]        Follow-up Information   Follow up with Gerrit Heck, MD. Schedule an appointment as soon as possible for a visit in 2 weeks.   Specialty:  Family Medicine   Contact information:   [place] [address] [phone]       Follow up with Candee Furbish, MD. (office will call for appointment in 1 week)    Specialty:  Cardiology   Contact information:   [address] [place] [address] [phone]        The  results of significant diagnostics from this hospitalization (including imaging, microbiology, ancillary and laboratory) are listed below for reference.    Significant Diagnostic Studies: Dg Chest Portable 1 View  03/05/2013   CLINICAL DATA:  RESPIRATORY DISTRESS RESPIRATORY DISTRESS  EXAM: PORTABLE CHEST - 1 VIEW  COMPARISON:  DG  RIBS W/ CHEST 3+V*R* dated 12/12/2011  FINDINGS: Stable enlarged cardiac silhouette. There is interval increase in diffuse fine airspace disease compared to prior. This is superimposed on emphysematous change in the lungs. Small bilateral pleural effusions present. No pneumothorax.  IMPRESSION: Diffuse airspace disease and effusions suggests congestive heart failure with pulmonary edema.   Electronically Signed   By: Suzy Bouchard M.D.   On: 03/05/2013 07:12    Microbiology: Recent Results (from the past 240 hour(s))  URINE CULTURE     Status: None   Collection Time    03/05/13  7:56 AM      Result Value Range Status   Specimen Description URINE, CATHETERIZED   Final   Special Requests NONE   Final   Culture  Setup Time     Final   Value: 03/05/2013 09:14     Performed at [facility]     Final   Value: >=100,000 COLONIES/ML     Performed at [facility]   Culture     Final   Value: ESCHERICHIA COLI     Performed at [facility]   Report Status 03/07/2013 FINAL   Final   Organism ID, Bacteria ESCHERICHIA COLI   Final  MRSA PCR SCREENING     Status: Abnormal   Collection Time    03/05/13  4:25 PM      Result Value Range Status   MRSA by PCR POSITIVE (*) NEGATIVE Final   Comment:            The GeneXpert MRSA Assay (FDA     approved for NASAL specimens     only), is one component of a     comprehensive MRSA colonization     surveillance program. It is not     intended to diagnose MRSA     infection nor to guide or     monitor treatment for     MRSA infections.     CRITICAL RESULT CALLED TO, READ BACK BY AND  VERIFIED WITH:     M.WILSON,RN AT 1950 BY L.PITT 03/05/13     Labs: Basic Metabolic Panel:  Recent Labs Lab 03/05/13 0705 03/05/13 1755 03/06/13 0226 03/07/13 0500 03/08/13 0500  NA 143  --  146 141 143  K 3.7  --  3.8 3.8 4.0  CL 104  --  106 103 103  CO2 22  --  27 26 27   GLUCOSE 221*  --  85 81 89  BUN 32*  --  26* 34* 49*  CREATININE 1.12* 0.97 1.08 1.42* 1.52*  CALCIUM 9.0  --  8.5 9.0 9.0  MG  --  1.8  --   --   --    Liver Function Tests:  Recent Labs Lab 03/05/13 0705 03/06/13 0226  AST 145* 54*  ALT 93* 65*  ALKPHOS 142* 109  BILITOT 0.5 0.4  PROT 7.2 6.3  ALBUMIN 3.6 3.1*   CBC:  Recent Labs Lab 03/05/13 0705 03/05/13 1755 03/06/13 0226  WBC 10.0 11.0* 9.6  NEUTROABS 6.7  --   --   HGB 14.1 11.5* 12.0  HCT 43.8 36.5 37.4  MCV 91.1 90.3 90.1  PLT 258 214 234   Cardiac Enzymes:  Recent Labs Lab 03/05/13 0705 03/05/13 1011 03/05/13 1755 03/05/13 2100  TROPONINI <0.30 <0.30 <0.30 0.32*   BNP: BNP (last 3 results)  Recent Labs  03/05/13 0705 03/06/13 0226  PROBNP 12653.0* [id number]*    Signed:  Tiffay Pinette  [facility] 03/08/2013, 2:29 PM

## 2013-03-09 NOTE — Progress Notes (Signed)
   CARE MANAGEMENT NOTE 03/09/2013  Patient:  [name], [name]   Account Number:  [id number]  Date Initiated:  03/06/2013  Documentation initiated by:  Elissa Hefty  Subjective/Objective Assessment:   adm w resp distress, pul edema     Action/Plan:   lives w husband, pcp dr Leighton Ruff   Anticipated DC Date:  03/10/2013   Anticipated DC Plan:  [place]  CM consult      [place]   Choice offered to / List presented to:  C-1 Patient        [place] arranged  HH-1 RN      [place].   Status of service:  Completed, signed off Medicare Important Message given?   (If response is "NO", the following Medicare IM given date fields will be blank) Date Medicare IM given:   Date Additional Medicare IM given:    Discharge Disposition:  [place]  Per UR Regulation:  Reviewed for med. necessity/level of care/duration of stay  If discussed at [place] of Stay Meetings, dates discussed:    Comments:  03/09/13 07:55 CM notified [facility] of discharge.  No other CM needs were communicated.  Mariane Masters, BSN, [place] [phone].   03/07/2013 CHF Consult Note Social:  lives w husband. Ambulates with cane prn - hx/o fx hip 04/2013 and eye problems.  Patient states she called 911 this admission d/t shortness of breath. 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   Also established with [facility] if needed. PCP:  Dr. Leighton Ruff Meds:  Patient admitts to missing doses of Lasix because she gets tired of taking her meds.  Hx/o DTR has helped with med box in the past but has not been available over the past couple of months. PT Eval _________pending    Patient states plans to d/c back home HHS:  RN - Med mgmt.   Patient elects HHS with [facility]  (CM wll notify [facility] once PT eval completed) ADD: +2-3 Mariann Laster RN, BSN, [place], CCM 03/07/2013

## 2013-03-10 ENCOUNTER — Other Ambulatory Visit: Payer: Self-pay | Admitting: Cardiology

## 2013-03-11 IMAGING — CR DG CHEST 1V PORT
1 series · 1 of 1 positions shown · non-contrast
Comparison: 10/05/2011.

CLINICAL DATA: History of shortness of breath.  History of
hypertension, COPD, congestive heart failure, and atrial
fibrillation.

PORTABLE CHEST - 1 VIEW

[AP]
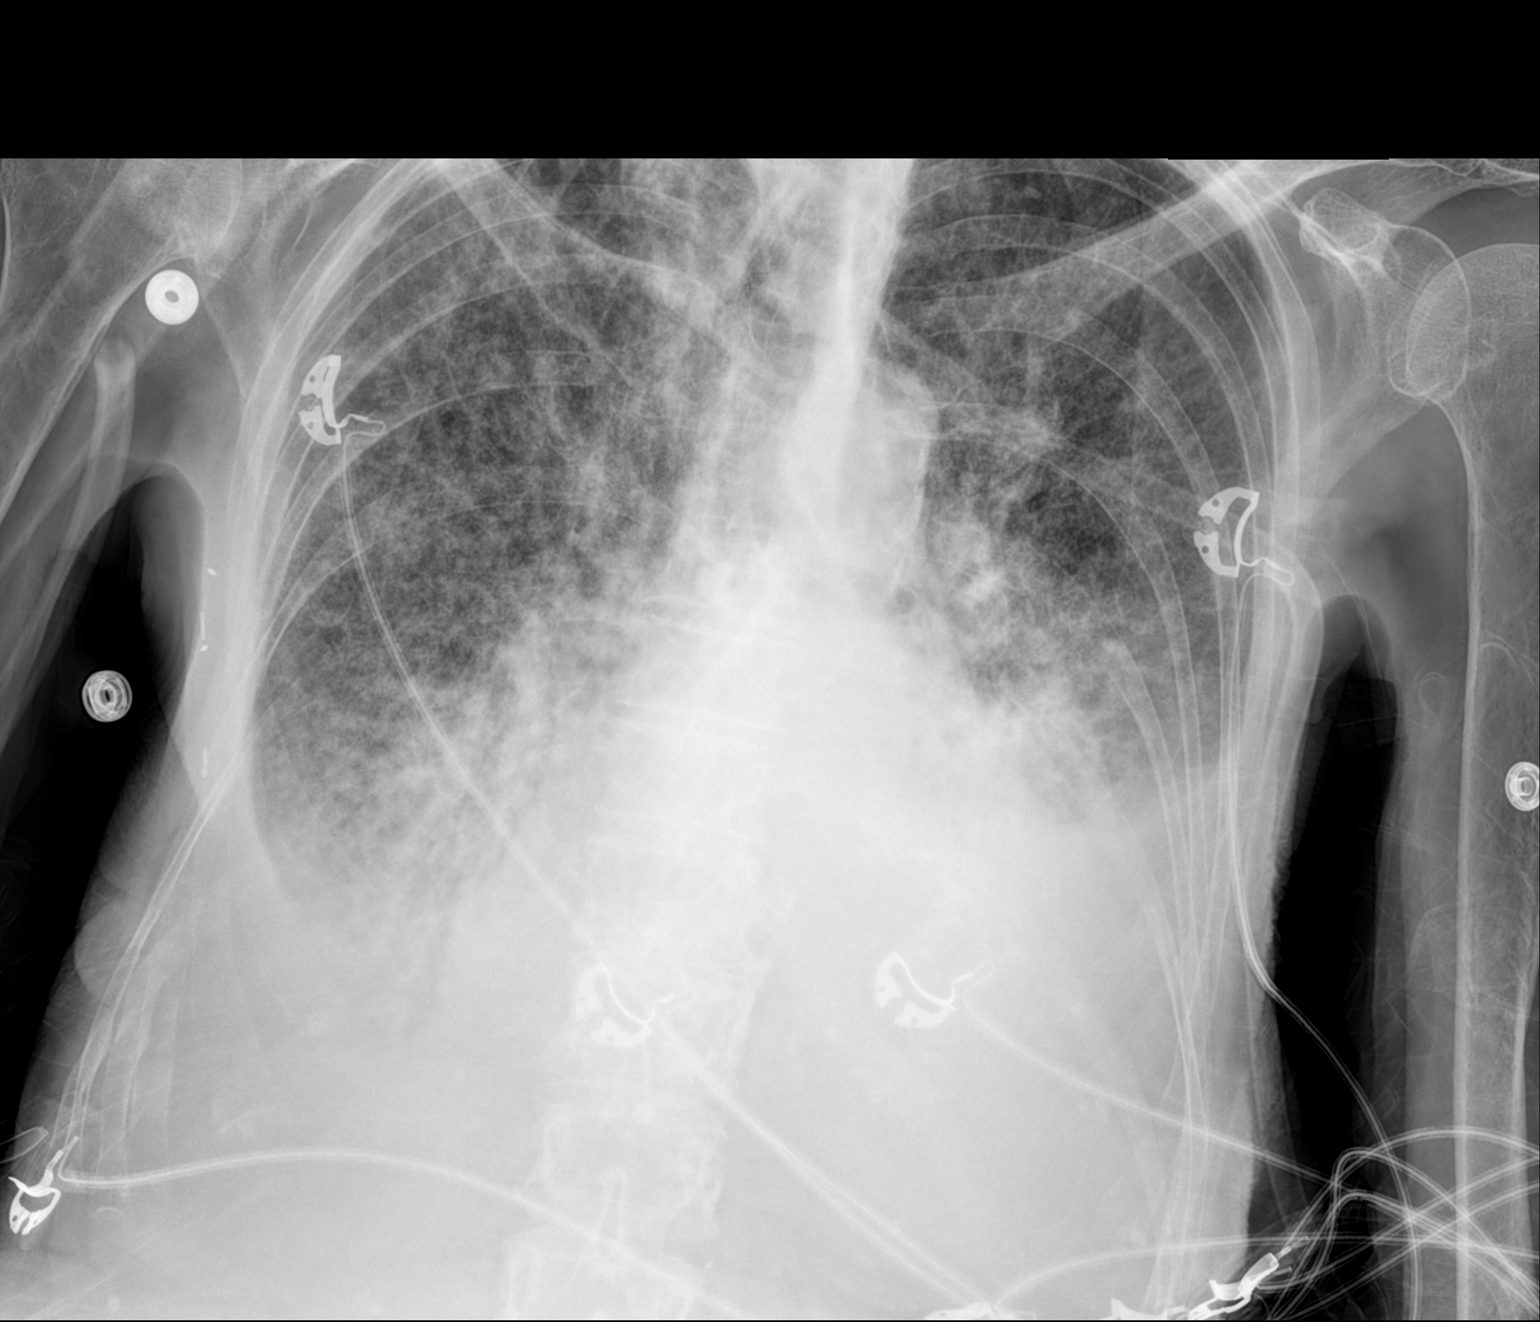

[1 of 1 positions shown; findings below may reference images not displayed]

FINDINGS: There is moderate enlargement of the cardiac silhouette.
Ectasia and nonaneurysmal calcification of the thoracic aorta are
seen.  There is diffuse vascular congestion pattern.  Reticular
interstitial opacities are seen within the lungs.  Bibasilar
atelectasis, pulmonary infiltrates, and bilateral pleural effusions
are present.  The pleural effusion appears increased on the left
compared to prior study.  There is also increase in the amount of
right pleural effusion with associated atelectasis and infiltrative
density.  Surgical clips are seen in the right axillary region.
There is osteopenic appearance of bones.  There is degenerative
spondylosis.
IMPRESSION: Moderate enlargement of the cardiac silhouette which appears larger
than on prior study.  Diffuse vascular congestion pattern with
reticular interstitial opacities diffusely.  Bibasilar atelectasis
and pulmonary infiltrative densities.  Interval increase in
bilateral pleural effusions.  Findings most likely reflect volume
overload and / or congestive heart failure and edema superimposed
on COPD..

## 2013-03-13 ENCOUNTER — Telehealth (HOSPITAL_COMMUNITY): Payer: Self-pay

## 2013-03-13 ENCOUNTER — Telehealth: Payer: Self-pay | Admitting: Cardiology

## 2013-03-13 DIAGNOSIS — I4891 Unspecified atrial fibrillation: Secondary | ICD-10-CM

## 2013-03-13 NOTE — Telephone Encounter (Signed)
New Message  Pt was advised by Dr. Drema Dallas (PCP) to cut back on lasix from 40 to 20 per day// Requesting a call back to determine if this adjustment is OK// Please call.

## 2013-03-14 ENCOUNTER — Other Ambulatory Visit: Payer: Self-pay | Admitting: Cardiology

## 2013-03-14 MED ORDER — AMIODARONE HCL 200 MG PO TABS: 200.0000 mg | ORAL_TABLET | Freq: Every day | ORAL | Status: AC

## 2013-03-14 NOTE — Telephone Encounter (Signed)
Agree with taking a half dose of Lasix at this point.

## 2013-03-14 NOTE — Telephone Encounter (Signed)
Spoke with patient sent refill in for patient advised that amiodarone should be taken at 200 mg once daily.

## 2013-03-14 NOTE — Telephone Encounter (Signed)
Spoke with patient pt. Stated that PCP cut the lasix in half due to lab results be positive for dehydration. She wanted your opinion on taking the half dose Dr. Marlou Porch.

## 2013-03-19 ENCOUNTER — Ambulatory Visit: Payer: Medicare Other | Admitting: Internal Medicine

## 2013-03-19 MED ORDER — FUROSEMIDE 40 MG PO TABS: 20.0000 mg | ORAL_TABLET | Freq: Every day | ORAL | Status: DC

## 2013-03-19 NOTE — Addendum Note (Signed)
Addended by: Phineas Inches D on: 03/19/2013 04:03 PM   Modules accepted: Orders

## 2013-03-19 NOTE — Telephone Encounter (Signed)
Spoke with patient advised that its ok that her PCP cut her Lasix to 20 mg once daily. Sent to Rx to pharmacy.

## 2013-03-28 ENCOUNTER — Other Ambulatory Visit: Payer: Self-pay | Admitting: Vascular Surgery

## 2013-03-28 DIAGNOSIS — I6529 Occlusion and stenosis of unspecified carotid artery: Secondary | ICD-10-CM

## 2013-03-28 DIAGNOSIS — I739 Peripheral vascular disease, unspecified: Secondary | ICD-10-CM

## 2013-03-31 ENCOUNTER — Telehealth: Payer: Self-pay | Admitting: Cardiology

## 2013-03-31 ENCOUNTER — Encounter: Payer: Self-pay | Admitting: Cardiology

## 2013-03-31 ENCOUNTER — Ambulatory Visit (INDEPENDENT_AMBULATORY_CARE_PROVIDER_SITE_OTHER): Payer: Medicare Other | Admitting: Cardiology

## 2013-03-31 VITALS — Ht 60.0 in | Wt 90.4 lb

## 2013-03-31 DIAGNOSIS — E43 Unspecified severe protein-calorie malnutrition: Secondary | ICD-10-CM

## 2013-03-31 DIAGNOSIS — I708 Atherosclerosis of other arteries: Secondary | ICD-10-CM

## 2013-03-31 DIAGNOSIS — I5022 Chronic systolic (congestive) heart failure: Secondary | ICD-10-CM

## 2013-03-31 DIAGNOSIS — I4891 Unspecified atrial fibrillation: Secondary | ICD-10-CM

## 2013-03-31 DIAGNOSIS — I771 Stricture of artery: Secondary | ICD-10-CM

## 2013-03-31 NOTE — Patient Instructions (Signed)
Your physician recommends that you continue on your current medications as directed. Please refer to the Current Medication list given to you today.  Your physician recommends that you schedule a follow-up appointment in: 2 months follow-up with Dr. Marlou Porch.  Start a 2 gram Sodium Diet  2 Gram Low Sodium Diet A 2 gram sodium diet restricts the amount of sodium in the diet to no more than 2 g or 2000 mg daily. Limiting the amount of sodium is often used to help lower blood pressure. It is important if you have heart, liver, or kidney problems. Many foods contain sodium for flavor and sometimes as a preservative. When the amount of sodium in a diet needs to be low, it is important to know what to look for when choosing foods and drinks. The following includes some information and guidelines to help make it easier for you to adapt to a low sodium diet. QUICK TIPS  Do not add salt to food.  Avoid convenience items and fast food.  Choose unsalted snack foods.  Buy lower sodium products, often labeled as "lower sodium" or "no salt added."  Check food labels to learn how much sodium is in 1 serving.  When eating at a restaurant, ask that your food be prepared with less salt or none, if possible. READING FOOD LABELS FOR SODIUM INFORMATION The nutrition facts label is a good place to find how much sodium is in foods. Look for products with no more than 500 to 600 mg of sodium per meal and no more than 150 mg per serving. Remember that 2 g = 2000 mg. The food label may also list foods as:  Sodium-free: Less than 5 mg in a serving.  Very low sodium: 35 mg or less in a serving.  Low-sodium: 140 mg or less in a serving.  Light in sodium: 50% less sodium in a serving. For example, if a food that usually has 300 mg of sodium is changed to become light in sodium, it will have 150 mg of sodium.  Reduced sodium: 25% less sodium in a serving. For example, if a food that usually has 400 mg of sodium is  changed to reduced sodium, it will have 300 mg of sodium. CHOOSING FOODS Grains  Avoid: Salted crackers and snack items. Some cereals, including instant hot cereals. Bread stuffing and biscuit mixes. Seasoned rice or pasta mixes.  Choose: Unsalted snack items. Low-sodium cereals, oats, puffed wheat and rice, shredded wheat. English muffins and bread. Pasta. Meats  Avoid: Salted, canned, smoked, spiced, pickled meats, including fish and poultry. Bacon, ham, sausage, cold cuts, hot dogs, anchovies.  Choose: Low-sodium canned tuna and salmon. Fresh or frozen meat, poultry, and fish. Dairy  Avoid: Processed cheese and spreads. Cottage cheese. Buttermilk and condensed milk. Regular cheese.  Choose: Milk. Low-sodium cottage cheese. Yogurt. Sour cream. Low-sodium cheese. Fruits and Vegetables  Avoid: Regular canned vegetables. Regular canned tomato sauce and paste. Frozen vegetables in sauces. Olives. Angie Fava. Relishes. Sauerkraut.  Choose: Low-sodium canned vegetables. Low-sodium tomato sauce and paste. Frozen or fresh vegetables. Fresh and frozen fruit. Condiments  Avoid: Canned and packaged gravies. Worcestershire sauce. Tartar sauce. Barbecue sauce. Soy sauce. Steak sauce. Ketchup. Onion, garlic, and table salt. Meat flavorings and tenderizers.  Choose: Fresh and dried herbs and spices. Low-sodium varieties of mustard and ketchup. Lemon juice. Tabasco sauce. Horseradish. SAMPLE 2 GRAM SODIUM MEAL PLAN Breakfast / Sodium (mg)  1 cup low-fat milk / 742 mg  2 slices whole-wheat toast /  270 mg  1 tbs heart-healthy margarine / 153 mg  1 hard-boiled egg / 139 mg  1 small orange / 0 mg Lunch / Sodium (mg)  1 cup raw carrots / 76 mg   cup hummus / 298 mg  1 cup low-fat milk / 143 mg   cup red grapes / 2 mg  1 whole-wheat pita bread / 356 mg Dinner / Sodium (mg)  1 cup whole-wheat pasta / 2 mg  1 cup low-sodium tomato sauce / 73 mg  3 oz lean ground beef / 57 mg  1  small side salad (1 cup raw spinach leaves,  cup cucumber,  cup yellow bell pepper) with 1 tsp olive oil and 1 tsp red wine vinegar / 25 mg Snack / Sodium (mg)  1 container low-fat vanilla yogurt / 107 mg  3 graham cracker squares / 127 mg Nutrient Analysis  Calories: 2033  Protein: 77 g  Carbohydrate: 282 g  Fat: 72 g  Sodium: 1971 mg Document Released: 02/06/2005 Document Revised: 05/01/2011 Document Reviewed: 05/10/2009 [employer] Patient Information 2014 [place].

## 2013-03-31 NOTE — Telephone Encounter (Signed)
New message  Patient needs to know how much Lasix to take, she is confused? Please call and advise.
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
## 2013-04-01 NOTE — Telephone Encounter (Signed)
Spoke with patient advised that her current Lasix dosage is 20 mg once daily. Advised to monitor weight, if 3 pounds in 1 day and 5 pounds in 1 week due to fluid retention she may take an addition tablet.

## 2013-04-16 ENCOUNTER — Ambulatory Visit (HOSPITAL_COMMUNITY): Payer: Self-pay | Admitting: Psychiatry

## 2013-04-30 ENCOUNTER — Other Ambulatory Visit (HOSPITAL_COMMUNITY): Payer: Self-pay | Admitting: *Deleted

## 2013-04-30 ENCOUNTER — Other Ambulatory Visit: Payer: Self-pay | Admitting: Cardiology

## 2013-04-30 DIAGNOSIS — F329 Major depressive disorder, single episode, unspecified: Secondary | ICD-10-CM

## 2013-04-30 DIAGNOSIS — F3289 Other specified depressive episodes: Secondary | ICD-10-CM

## 2013-04-30 MED ORDER — SERTRALINE HCL 25 MG PO TABS: ORAL_TABLET | ORAL | Status: DC

## 2013-05-12 ENCOUNTER — Encounter: Payer: Self-pay | Admitting: Vascular Surgery

## 2013-05-13 ENCOUNTER — Encounter: Payer: Self-pay | Admitting: Vascular Surgery

## 2013-05-13 ENCOUNTER — Ambulatory Visit (INDEPENDENT_AMBULATORY_CARE_PROVIDER_SITE_OTHER): Payer: 59 | Admitting: Vascular Surgery

## 2013-05-13 ENCOUNTER — Other Ambulatory Visit: Payer: Self-pay | Admitting: Vascular Surgery

## 2013-05-13 ENCOUNTER — Ambulatory Visit (HOSPITAL_COMMUNITY)
Admission: RE | Admit: 2013-05-13 | Discharge: 2013-05-13 | Disposition: A | Discharge status: Home / Self Care | Payer: Medicare Other | Source: Ambulatory Visit | Attending: Vascular Surgery | Admitting: Vascular Surgery

## 2013-05-13 ENCOUNTER — Ambulatory Visit (INDEPENDENT_AMBULATORY_CARE_PROVIDER_SITE_OTHER)
Admission: RE | Admit: 2013-05-13 | Discharge: 2013-05-13 | Disposition: A | Discharge status: Home / Self Care | Payer: Medicare Other | Source: Ambulatory Visit | Attending: Vascular Surgery | Admitting: Vascular Surgery

## 2013-05-13 VITALS — BP 84/51 | HR 58 | Ht 60.0 in | Wt 89.5 lb

## 2013-05-13 DIAGNOSIS — I6529 Occlusion and stenosis of unspecified carotid artery: Secondary | ICD-10-CM

## 2013-05-13 DIAGNOSIS — I739 Peripheral vascular disease, unspecified: Secondary | ICD-10-CM

## 2013-05-13 DIAGNOSIS — I70219 Atherosclerosis of native arteries of extremities with intermittent claudication, unspecified extremity: Secondary | ICD-10-CM
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
## 2013-06-02 ENCOUNTER — Other Ambulatory Visit: Payer: Self-pay | Admitting: *Deleted

## 2013-06-02 ENCOUNTER — Ambulatory Visit: Payer: Medicare Other | Admitting: Cardiology

## 2013-06-02 MED ORDER — FUROSEMIDE 20 MG PO TABS: 20.0000 mg | ORAL_TABLET | Freq: Every day | ORAL | Status: DC

## 2013-06-24 ENCOUNTER — Encounter (INDEPENDENT_AMBULATORY_CARE_PROVIDER_SITE_OTHER): Payer: Self-pay

## 2013-06-24 ENCOUNTER — Ambulatory Visit (INDEPENDENT_AMBULATORY_CARE_PROVIDER_SITE_OTHER): Payer: 59 | Admitting: Cardiology

## 2013-06-24 ENCOUNTER — Encounter: Payer: Self-pay | Admitting: Cardiology

## 2013-06-24 VITALS — BP 96/54 | HR 60 | Ht 65.0 in | Wt 90.0 lb

## 2013-06-24 DIAGNOSIS — I708 Atherosclerosis of other arteries: Secondary | ICD-10-CM

## 2013-06-24 DIAGNOSIS — I771 Stricture of artery: Secondary | ICD-10-CM

## 2013-06-24 DIAGNOSIS — I4891 Unspecified atrial fibrillation: Secondary | ICD-10-CM

## 2013-06-24 DIAGNOSIS — E43 Unspecified severe protein-calorie malnutrition: Secondary | ICD-10-CM

## 2013-06-24 DIAGNOSIS — I5022 Chronic systolic (congestive) heart failure: Secondary | ICD-10-CM

## 2013-06-24 DIAGNOSIS — R627 Adult failure to thrive: Secondary | ICD-10-CM

## 2013-06-24 MED ORDER — POTASSIUM CHLORIDE CRYS ER 20 MEQ PO TBCR: EXTENDED_RELEASE_TABLET | ORAL | Status: DC

## 2013-06-24 MED ORDER — FUROSEMIDE 20 MG PO TABS: ORAL_TABLET | ORAL | Status: DC

## 2013-06-24 NOTE — Patient Instructions (Addendum)
Your physician recommends that you schedule a follow-up appointment in: 4 MONTHS WITH DR [facility]   CHANGE FUROSEMIDE TO ONE TABLET EVERY Monday AND Thursday  CHANGE POTASSIUM TO ONE TABLET EVERY Monday AND Thursday  CAN TAKE EXTRA FUROSEMIDE AS NEEDED FOR WEIGHT GAIN OF 3 TO 5 LBS TAKE EXTRA POTASSIUM WITH EVERY EXTRA FUROSEMIDE
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
## 2013-06-27 ENCOUNTER — Ambulatory Visit: Payer: 59 | Attending: Internal Medicine | Admitting: Physical Therapy

## 2013-07-09 ENCOUNTER — Other Ambulatory Visit: Payer: Self-pay

## 2013-07-09 MED ORDER — METOPROLOL SUCCINATE ER 50 MG PO TB24: ORAL_TABLET | ORAL | Status: DC

## 2013-07-21 DIAGNOSIS — W19XXXA Unspecified fall, initial encounter: Secondary | ICD-10-CM

## 2013-07-21 DIAGNOSIS — Y92009 Unspecified place in unspecified non-institutional (private) residence as the place of occurrence of the external cause: Secondary | ICD-10-CM

## 2013-07-21 HISTORY — DX: Unspecified place in unspecified non-institutional (private) residence as the place of occurrence of the external cause: Y92.009

## 2013-07-21 HISTORY — DX: Unspecified place in unspecified non-institutional (private) residence as the place of occurrence of the external cause: W19.XXXA

## 2013-08-11 ENCOUNTER — Telehealth: Payer: Self-pay

## 2013-08-11 NOTE — Telephone Encounter (Signed)
Phone call from pt.  Stated she has swelling in her legs with left >right.  Stated there is very little swelling in the right leg today.  Reported her swelling comes and goes, and seems to improve, the more she moves around.   Stated she fell this morning from a stool, and has discomfort in her groin and her (L) calf area.  Noted her last cardiology note indicated her Lasix was cut down to 2 x/week.  Questioned pt. if the swelling has increased, since her Lasix was decreased to 2 x/ week; stated "it could be."  Questioned if she is checking her weight daily, based on the cardiology guidelines to take extra dose of Lasix, if has weight gain of 3 lbs.  Stated she isn't able to weigh herself, and her nurses aides don't come until later in the day, so she feels that an afternoon weight wouldn't be accurate.  Advised to try to arrange an early morning weight with her nursing assistants at least 2x/week.  Verb. Understanding.  Advised she should notify her PCP of the fall she took this morning, and determine if further evaluation is necessary.  Verb. understanding.

## 2013-10-09 ENCOUNTER — Telehealth: Payer: Self-pay | Admitting: Cardiology

## 2013-10-09 NOTE — Telephone Encounter (Signed)
Spoke with pt who reports her left foot is swelling everyday, is very red/hot and is very uncomfortable for the past couple of weeks.  She is having occasional shooting pains in it but has not noticed any streaking.  On occasion it has been weeping but not doing that the last couple of days.  She reports that it remains swollen even overnight.  Several years ago Dr Donnetta Hutching put in stents and she called their office several weeks ago and was instructed to keep taking her Furosemide and it it elevated.  She has been doing this but it doesn't seem to be helping.  She reports her right foot and leg look fine.  She c/o fatigue d/t Furosemide keeping her up at night.  She is only taking it twice a week as RXed.  Advised I will forward this information to Dr Marlou Porch and call her back with recommendations.  Also advised her to keep it elevated above the level of her heart and she should probably call Dr Luther Parody office to see if he can or should see her back for evaluation.

## 2013-10-09 NOTE — Telephone Encounter (Signed)
New Message  Pt's comfort keeper caregiver called to report symptoms. Swelling of left leg and redness around the bandages.. Also her big toe is swelling and it is turning blue black.. Please call back to discuss,

## 2013-10-10 ENCOUNTER — Other Ambulatory Visit (HOSPITAL_COMMUNITY): Payer: Self-pay

## 2013-10-10 ENCOUNTER — Telehealth: Payer: Self-pay

## 2013-10-10 ENCOUNTER — Ambulatory Visit: Payer: Self-pay | Admitting: Vascular Surgery

## 2013-10-10 DIAGNOSIS — I70209 Unspecified atherosclerosis of native arteries of extremities, unspecified extremity: Secondary | ICD-10-CM

## 2013-10-10 DIAGNOSIS — L98499 Non-pressure chronic ulcer of skin of other sites with unspecified severity: Principal | ICD-10-CM

## 2013-10-10 DIAGNOSIS — L819 Disorder of pigmentation, unspecified: Secondary | ICD-10-CM

## 2013-10-10 NOTE — Telephone Encounter (Signed)
Phone call back to pt.  Spoke with Lauren, pt's. Home health aide from [place].  Advised Lauren of plan to evaluate for DVT of left LE due to redness, warmth, and swelling of left foot up to ankle.  Lauren stated, "she really doesn't have swelling in the foot, and it isn't red or warm today."  Lauren reported that she had removed a bandage lower left leg, just above the ankle, that has redness where the bandage had been; estimated redness in area approx. 1/2 size of deck of cards.  Denies any drainage today.  Questioned that the report the pt. gave earlier was different; informed her the pt.  Reported the left foot was red, warm, and swollen.  Lauren stated that the pt. said it had been in the past.  Lauren again reported no swelling or redness of the left foot today.  Advised Lauren that will cancel plan to do a venous duplex today, and will notify pt. of a future appt. to be eval. for her PVD.  Advised to call if symptoms worsen.  Verb. Understanding.

## 2013-10-10 NOTE — Telephone Encounter (Signed)
Pt aware of Dr Marlou Porch recommendations and will call Dr Luther Parody office to see about getting an appointment.  She will call back there is an issue.

## 2013-10-10 NOTE — Telephone Encounter (Signed)
Please get in to see Dr. Donnetta Hutching. Toe "turning black"? Candee Furbish, MD
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
## 2013-10-13 ENCOUNTER — Encounter: Payer: Self-pay | Admitting: Family

## 2013-10-13 ENCOUNTER — Telehealth: Payer: Self-pay | Admitting: Cardiology

## 2013-10-13 NOTE — Telephone Encounter (Signed)
New message     Talk to a nurse regarding having sob. She said it feels like a bad case of indigestion.  She said it is worse at night. Please advise.

## 2013-10-13 NOTE — Telephone Encounter (Signed)
**Note De-Identified Normand Damron Obfuscation** The pt states that she had a "bad" night last night with SOB and chest discomfort. She states that she took Rolaids and has been burping which has relieved her chest discomfort but that she continues to have SOB. The pt is advised that I will discuss with Dr Marlou Porch and call her back. She verbalized understanding.

## 2013-10-13 NOTE — Telephone Encounter (Signed)
**Note De-Identified [name] Obfuscation** Per Dr Marlou Porch the pt is advised to take Lasix 20 mg every morning X [redacted] week along with her Potassium. She verbalized understanding and agrees with the plan.  I offered to call in additional Lasix and Potassium tablets to the pts pharmacy but she states she may have enough and will call us back if she needs more.

## 2013-10-14 ENCOUNTER — Encounter: Payer: Self-pay | Admitting: Family

## 2013-10-14 ENCOUNTER — Ambulatory Visit (HOSPITAL_COMMUNITY)
Admission: RE | Admit: 2013-10-14 | Discharge: 2013-10-14 | Disposition: A | Discharge status: Home / Self Care | Payer: Medicare Other | Source: Ambulatory Visit | Attending: Family | Admitting: Family

## 2013-10-14 ENCOUNTER — Other Ambulatory Visit: Payer: Self-pay

## 2013-10-14 ENCOUNTER — Ambulatory Visit (INDEPENDENT_AMBULATORY_CARE_PROVIDER_SITE_OTHER): Payer: Medicare Other | Admitting: Family

## 2013-10-14 VITALS — BP 90/62 | HR 87 | Resp 16 | Ht 66.0 in | Wt 94.0 lb

## 2013-10-14 DIAGNOSIS — I739 Peripheral vascular disease, unspecified: Secondary | ICD-10-CM | POA: Diagnosis not present

## 2013-10-14 DIAGNOSIS — I83029 Varicose veins of left lower extremity with ulcer of unspecified site: Secondary | ICD-10-CM

## 2013-10-14 DIAGNOSIS — L97929 Non-pressure chronic ulcer of unspecified part of left lower leg with unspecified severity: Secondary | ICD-10-CM

## 2013-10-14 DIAGNOSIS — M7989 Other specified soft tissue disorders: Secondary | ICD-10-CM | POA: Insufficient documentation

## 2013-10-14 DIAGNOSIS — L98499 Non-pressure chronic ulcer of skin of other sites with unspecified severity: Secondary | ICD-10-CM | POA: Insufficient documentation

## 2013-10-14 DIAGNOSIS — L819 Disorder of pigmentation, unspecified: Secondary | ICD-10-CM | POA: Diagnosis present

## 2013-10-14 DIAGNOSIS — M79662 Pain in left lower leg: Secondary | ICD-10-CM | POA: Insufficient documentation

## 2013-10-14 DIAGNOSIS — I70209 Unspecified atherosclerosis of native arteries of extremities, unspecified extremity: Secondary | ICD-10-CM

## 2013-10-14 DIAGNOSIS — M79609 Pain in unspecified limb: Secondary | ICD-10-CM

## 2013-10-14 DIAGNOSIS — L97909 Non-pressure chronic ulcer of unspecified part of unspecified lower leg with unspecified severity: Secondary | ICD-10-CM

## 2013-10-14 DIAGNOSIS — I83009 Varicose veins of unspecified lower extremity with ulcer of unspecified site: Secondary | ICD-10-CM

## 2013-10-14 MED ORDER — METOPROLOL SUCCINATE ER 50 MG PO TB24: ORAL_TABLET | ORAL | Status: DC

## 2013-10-14 MED ORDER — SILVER SULFADIAZINE 1 % EX CREA: 1.0000 "application " | TOPICAL_CREAM | Freq: Every day | CUTANEOUS | Status: AC
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
## 2013-10-14 NOTE — Patient Instructions (Addendum)
Stroke Prevention Some medical conditions and behaviors are associated with an increased chance of having a stroke. You may prevent a stroke by making healthy choices and managing medical conditions. HOW CAN I REDUCE MY RISK OF HAVING A STROKE?   Stay physically active. Get at least 30 minutes of activity on most or all days.  Do not smoke. It may also be helpful to avoid exposure to secondhand smoke.  Limit alcohol use. Moderate alcohol use is considered to be:  No more than 2 drinks per day for men.  No more than 1 drink per day for nonpregnant women.  Eat healthy foods. This involves:  Eating 5 or more servings of fruits and vegetables a day.  Making dietary changes that address high blood pressure (hypertension), high cholesterol, diabetes, or obesity.  Manage your cholesterol levels.  Making food choices that are high in fiber and low in saturated fat, trans fat, and cholesterol may control cholesterol levels.  Take any prescribed medicines to control cholesterol as directed by your health care provider.  Manage your diabetes.  Controlling your carbohydrate and sugar intake is recommended to manage diabetes.  Take any prescribed medicines to control diabetes as directed by your health care provider.  Control your hypertension.  Making food choices that are low in salt (sodium), saturated fat, trans fat, and cholesterol is recommended to manage hypertension.  Take any prescribed medicines to control hypertension as directed by your health care provider.  Maintain a healthy weight.  Reducing calorie intake and making food choices that are low in sodium, saturated fat, trans fat, and cholesterol are recommended to manage weight.  Stop drug abuse.  Avoid taking birth control pills.  Talk to your health care provider about the risks of taking birth control pills if you are over 35 years old, smoke, get migraines, or have ever had a blood clot.  Get evaluated for sleep  disorders (sleep apnea).  Talk to your health care provider about getting a sleep evaluation if you snore a lot or have excessive sleepiness.  Take medicines only as directed by your health care provider.  For some people, aspirin or blood thinners (anticoagulants) are helpful in reducing the risk of forming abnormal blood clots that can lead to stroke. If you have the irregular heart rhythm of atrial fibrillation, you should be on a blood thinner unless there is a good reason you cannot take them.  Understand all your medicine instructions.  Make sure that other conditions (such as anemia or atherosclerosis) are addressed. SEEK IMMEDIATE MEDICAL CARE IF:   You have sudden weakness or numbness of the face, arm, or leg, especially on one side of the body.  Your face or eyelid droops to one side.  You have sudden confusion.  You have trouble speaking (aphasia) or understanding.  You have sudden trouble seeing in one or both eyes.  You have sudden trouble walking.  You have dizziness.  You have a loss of balance or coordination.  You have a sudden, severe headache with no known cause.  You have new chest pain or an irregular heartbeat. Any of these symptoms may represent a serious problem that is an emergency. Do not wait to see if the symptoms will go away. Get medical help at once. Call your local emergency services (911 in [place] Do not drive yourself to the hospital. Document Released: 03/16/2004 Document Revised: 06/23/2013 Document Reviewed: 08/09/2012 [employer] Patient Information 2015 [employer]. This information is not intended to replace advice given   to you by your health care provider. Make sure you discuss any questions you have with your health care provider.   Peripheral Vascular Disease Peripheral Vascular Disease (PVD), also called Peripheral Arterial Disease (PAD), is a circulation problem caused by cholesterol (atherosclerotic plaque) deposits in the arteries.  PVD commonly occurs in the lower extremities (legs) but it can occur in other areas of the body, such as your arms. The cholesterol buildup in the arteries reduces blood flow which can cause pain and other serious problems. The presence of PVD can place a person at risk for Coronary Artery Disease (CAD).  CAUSES  Causes of PVD can be many. It is usually associated with more than one risk factor such as:   High Cholesterol.  Smoking.  Diabetes.  Lack of exercise or inactivity.  High blood pressure (hypertension).  Obesity.  Family history. SYMPTOMS   When the lower extremities are affected, patients with PVD may experience:  Leg pain with exertion or physical activity. This is called INTERMITTENT CLAUDICATION. This may present as cramping or numbness with physical activity. The location of the pain is associated with the level of blockage. For example, blockage at the abdominal level (distal abdominal aorta) may result in buttock or hip pain. Lower leg arterial blockage may result in calf pain.  As PVD becomes more severe, pain can develop with less physical activity.  In people with severe PVD, leg pain may occur at rest.  Other PVD signs and symptoms:  Leg numbness or weakness.  Coldness in the affected leg or foot, especially when compared to the other leg.  A change in leg color.  Patients with significant PVD are more prone to ulcers or sores on toes, feet or legs. These may take longer to heal or may reoccur. The ulcers or sores can become infected.  If signs and symptoms of PVD are ignored, gangrene may occur. This can result in the loss of toes or loss of an entire limb.  Not all leg pain is related to PVD. Other medical conditions can cause leg pain such as:  Blood clots (embolism) or Deep Vein Thrombosis.  Inflammation of the blood vessels (vasculitis).  Spinal stenosis. DIAGNOSIS  Diagnosis of PVD can involve several different types of tests. These can  include:  Pulse Volume Recording Method (PVR). This test is simple, painless and does not involve the use of X-rays. PVR involves measuring and comparing the blood pressure in the arms and legs. An ABI (Ankle-Brachial Index) is calculated. The normal ratio of blood pressures is 1. As this number becomes smaller, it indicates more severe disease.  < 0.95 - indicates significant narrowing in one or more leg vessels.  <0.8 - there will usually be pain in the foot, leg or buttock with exercise.  <0.4 - will usually have pain in the legs at rest.  <0.25 - usually indicates limb threatening PVD.  Doppler detection of pulses in the legs. This test is painless and checks to see if you have a pulses in your legs/feet.  A dye or contrast material (a substance that highlights the blood vessels so they show up on x-ray) may be given to help your caregiver better see the arteries for the following tests. The dye is eliminated from your body by the kidney's. Your caregiver may order blood work to check your kidney function and other laboratory values before the following tests are performed:  Magnetic Resonance Angiography (MRA). An MRA is a picture study of the blood vessels   and arteries. The MRA machine uses a large magnet to produce images of the blood vessels.  Computed Tomography Angiography (CTA). A CTA is a specialized x-ray that looks at how the blood flows in your blood vessels. An IV may be inserted into your arm so contrast dye can be injected.  Angiogram. Is a procedure that uses x-rays to look at your blood vessels. This procedure is minimally invasive, meaning a small incision (cut) is made in your groin. A small tube (catheter) is then inserted into the artery of your groin. The catheter is guided to the blood vessel or artery your caregiver wants to examine. Contrast dye is injected into the catheter. X-rays are then taken of the blood vessel or artery. After the images are obtained, the  catheter is taken out. TREATMENT  Treatment of PVD involves many interventions which may include:  Lifestyle changes:  Quitting smoking.  Exercise.  Following a low fat, low cholesterol diet.  Control of diabetes.  Foot care is very important to the PVD patient. Good foot care can help prevent infection.  Medication:  Cholesterol-lowering medicine.  Blood pressure medicine.  Anti-platelet drugs.  Certain medicines may reduce symptoms of Intermittent Claudication.  Interventional/Surgical options:  Angioplasty. An Angioplasty is a procedure that inflates a balloon in the blocked artery. This opens the blocked artery to improve blood flow.  Stent Implant. A wire mesh tube (stent) is placed in the artery. The stent expands and stays in place, allowing the artery to remain open.  Peripheral Bypass Surgery. This is a surgical procedure that reroutes the blood around a blocked artery to help improve blood flow. This type of procedure may be performed if Angioplasty or stent implants are not an option. SEEK IMMEDIATE MEDICAL CARE IF:   You develop pain or numbness in your arms or legs.  Your arm or leg turns cold, becomes blue in color.  You develop redness, warmth, swelling and pain in your arms or legs. MAKE SURE YOU:   Understand these instructions.  Will watch your condition.  Will get help right away if you are not doing well or get worse. Document Released: 03/16/2004 Document Revised: 05/01/2011 Document Reviewed: 02/11/2008 [facility] Patient Information 2015 [place], [place]. This information is not intended to replace advice given to you by your health care provider. Make sure you discuss any questions you have with your health care provider.   Venous Stasis or Chronic Venous Insufficiency Chronic venous insufficiency, also called venous stasis, is a condition that affects the veins in the legs. The condition prevents blood from being pumped through these veins  effectively. Blood may no longer be pumped effectively from the legs back to the heart. This condition can range from mild to severe. With proper treatment, you should be able to continue with an active life. CAUSES  Chronic venous insufficiency occurs when the vein walls become stretched, weakened, or damaged or when valves within the vein are damaged. Some common causes of this include:  High blood pressure inside the veins (venous hypertension).  Increased blood pressure in the leg veins from long periods of sitting or standing.  A blood clot that blocks blood flow in a vein (deep vein thrombosis).  Inflammation of a superficial vein (phlebitis) that causes a blood clot to form. RISK FACTORS Various things can make you more likely to develop chronic venous insufficiency, including:  Family history of this condition.  Obesity.  Pregnancy.  Sedentary lifestyle.  Smoking.  Jobs requiring long periods of standing  or sitting in one place.  Being a certain age. Women in their 39s and 42s and men in their 66s are more likely to develop this condition. SIGNS AND SYMPTOMS  Symptoms may include:   Varicose veins.  Skin breakdown or ulcers.  Reddened or discolored skin on the leg.  Brown, smooth, tight, and painful skin just above the ankle, usually on the inside surface (lipodermatosclerosis).  Swelling. DIAGNOSIS  To diagnose this condition, your health care provider will take a medical history and do a physical exam. The following tests may be ordered to confirm the diagnosis:  Duplex ultrasound--A procedure that produces a picture of a blood vessel and nearby organs and also provides information on blood flow through the blood vessel.  Plethysmography--A procedure that tests blood flow.  A venogram, or venography--A procedure used to look at the veins using X-ray and dye. TREATMENT The goals of treatment are to help you return to an active life and to minimize pain or  disability. Treatment will depend on the severity of the condition. Medical procedures may be needed for severe cases. Treatment options may include:   Use of compression stockings. These can help with symptoms and lower the chances of the problem getting worse, but they do not cure the problem.  Sclerotherapy--A procedure involving an injection of a material that "dissolves" the damaged veins. Other veins in the network of blood vessels take over the function of the damaged veins.  Surgery to remove the vein or cut off blood flow through the vein (vein stripping or laser ablation surgery).  Surgery to repair a valve. HOME CARE INSTRUCTIONS   Wear compression stockings as directed by your health care provider.  Only take over-the-counter or prescription medicines for pain, discomfort, or fever as directed by your health care provider.  Follow up with your health care provider as directed. SEEK MEDICAL CARE IF:   You have redness, swelling, or increasing pain in the affected area.  You see a red streak or line that extends up or down from the affected area.  You have a breakdown or loss of skin in the affected area, even if the breakdown is small.  You have an injury to the affected area. SEEK IMMEDIATE MEDICAL CARE IF:   You have an injury and open wound in the affected area.  Your pain is severe and does not improve with medicine.  You have sudden numbness or weakness in the foot or ankle below the affected area, or you have trouble moving your foot or ankle.  You have a fever or persistent symptoms for more than 2-3 days.  You have a fever and your symptoms suddenly get worse. MAKE SURE YOU:   Understand these instructions.  Will watch your condition.  Will get help right away if you are not doing well or get worse. Document Released: 06/12/2006 Document Revised: 11/27/2012 Document Reviewed: 10/14/2012 [facility] Patient Information 2015 [place], [place]. This information  is not intended to replace advice given to you by your health care provider. Make sure you discuss any questions you have with your health care provider.   Stasis Ulcer Stasis ulcers occur in the legs when the circulation is damaged. An ulcer may look like a small hole in the skin.  CAUSES Stasis ulcers occur because your veins do not work properly. Veins have valves that help the blood return to the heart. If these valves do not work right, blood flows backwards and backs up into the veins near the skin.  This condition causes the veins to become larger because of increased pressure and may lead to a stasis ulcer. SYMPTOMS   Shallow (superficial) sore on the leg.  Clear drainage or weeping from the sore.  Leg pain or a feeling of heaviness. This may be worse at the end of the day.  Leg swelling.  Skin color changes. DIAGNOSIS  Your caregiver will make a diagnosis by examining your leg. Your caregiver may order tests such as an ultrasound or other studies to evaluate the blood flow of the leg. HOME CARE INSTRUCTIONS   Do not stand or sit in one position for long periods of time. Do not sit with your legs crossed. Rest with your legs raised during the day. If possible, it is best if you can elevate your legs above your heart for 30 minutes, 3 to 4 times a day.  Wear elastic stockings or support hose. Do not wear other tight encircling garments around legs, pelvis, or waist. This causes increased pressure in your veins. If your caregiver has applied compressive medicated wraps, use them as instructed.  Walk as much as possible to increase blood flow. If you are taking long rides in a car or plane, take a break to walk around every 2 hours. If not already on aspirin, take a baby aspirin before long trips unless you have medical reasons that prohibit this.  Raise the foot of your bed at night with 2-inch blocks if approved by your caregiver. This may not be desirable if you have heart failure  or breathing problems.  If you get a cut in the skin over the vein and the vein bleeds, lie down with your leg raised and gently clean the area with a clean cloth. Apply pressure on the cut until the bleeding stops. Then place a dressing on the cut. See your caregiver if it continues to bleed or needs stitches. Also, see your caregiver if you develop an infection.Signs of an infection include a fever, redness, increased pain, and drainage of pus.  If your caregiver has given you a follow-up appointment, it is very important to keep that appointment. Not keeping the appointment could result in a chronic or permanent injury, pain, and disability. If there is any problem keeping the appointment, call your caregiver for assistance. SEEK IMMEDIATE MEDICAL CARE IF:  The ulcer area starts to break down.  You have pain, redness, tenderness, pus, or hard swelling in your leg over a vein or near the ulcer.  Your leg pain is uncomfortable.  You develop an unexplained fever.  You develop chest pain or shortness of breath. Document Released: 11/01/2000 Document Revised: 05/01/2011 Document Reviewed: 05/29/2010 [facility] Patient Information 2015 [place], [place]. This information is not intended to replace advice given to you by your health care provider. Make sure you discuss any questions you have with your health care provider.

## 2013-10-20 ENCOUNTER — Emergency Department (HOSPITAL_COMMUNITY): Payer: Medicare Other

## 2013-10-20 ENCOUNTER — Inpatient Hospital Stay (HOSPITAL_COMMUNITY): Payer: Medicare Other

## 2013-10-20 ENCOUNTER — Inpatient Hospital Stay (HOSPITAL_COMMUNITY)
Admission: EM | Admit: 2013-10-20 | Discharge: 2013-10-22 | DRG: 291 | Disposition: A | Discharge status: Home / Self Care | Payer: Medicare Other | Attending: Family Medicine | Admitting: Family Medicine

## 2013-10-20 ENCOUNTER — Encounter (HOSPITAL_COMMUNITY): Payer: Self-pay | Admitting: Radiology

## 2013-10-20 DIAGNOSIS — D72829 Elevated white blood cell count, unspecified: Secondary | ICD-10-CM

## 2013-10-20 DIAGNOSIS — Z66 Do not resuscitate: Secondary | ICD-10-CM | POA: Diagnosis present

## 2013-10-20 DIAGNOSIS — Z7982 Long term (current) use of aspirin: Secondary | ICD-10-CM | POA: Diagnosis not present

## 2013-10-20 DIAGNOSIS — L98499 Non-pressure chronic ulcer of skin of other sites with unspecified severity: Secondary | ICD-10-CM

## 2013-10-20 DIAGNOSIS — I4891 Unspecified atrial fibrillation: Secondary | ICD-10-CM

## 2013-10-20 DIAGNOSIS — I5023 Acute on chronic systolic (congestive) heart failure: Secondary | ICD-10-CM

## 2013-10-20 DIAGNOSIS — J449 Chronic obstructive pulmonary disease, unspecified: Secondary | ICD-10-CM

## 2013-10-20 DIAGNOSIS — Z01810 Encounter for preprocedural cardiovascular examination: Secondary | ICD-10-CM

## 2013-10-20 DIAGNOSIS — R222 Localized swelling, mass and lump, trunk: Secondary | ICD-10-CM

## 2013-10-20 DIAGNOSIS — Z9010 Acquired absence of unspecified breast and nipple: Secondary | ICD-10-CM | POA: Diagnosis not present

## 2013-10-20 DIAGNOSIS — Z87891 Personal history of nicotine dependence: Secondary | ICD-10-CM | POA: Diagnosis not present

## 2013-10-20 DIAGNOSIS — I5042 Chronic combined systolic (congestive) and diastolic (congestive) heart failure: Secondary | ICD-10-CM

## 2013-10-20 DIAGNOSIS — R0989 Other specified symptoms and signs involving the circulatory and respiratory systems: Secondary | ICD-10-CM

## 2013-10-20 DIAGNOSIS — I471 Supraventricular tachycardia, unspecified: Secondary | ICD-10-CM

## 2013-10-20 DIAGNOSIS — H3530 Unspecified macular degeneration: Secondary | ICD-10-CM | POA: Diagnosis present

## 2013-10-20 DIAGNOSIS — I472 Ventricular tachycardia, unspecified: Secondary | ICD-10-CM | POA: Diagnosis not present

## 2013-10-20 DIAGNOSIS — M2420 Disorder of ligament, unspecified site: Secondary | ICD-10-CM

## 2013-10-20 DIAGNOSIS — Z681 Body mass index (BMI) 19 or less, adult: Secondary | ICD-10-CM | POA: Diagnosis not present

## 2013-10-20 DIAGNOSIS — J431 Panlobular emphysema: Secondary | ICD-10-CM

## 2013-10-20 DIAGNOSIS — J9600 Acute respiratory failure, unspecified whether with hypoxia or hypercapnia: Secondary | ICD-10-CM

## 2013-10-20 DIAGNOSIS — I771 Stricture of artery: Secondary | ICD-10-CM

## 2013-10-20 DIAGNOSIS — I509 Heart failure, unspecified: Secondary | ICD-10-CM

## 2013-10-20 DIAGNOSIS — Z880 Allergy status to penicillin: Secondary | ICD-10-CM | POA: Diagnosis not present

## 2013-10-20 DIAGNOSIS — B9620 Unspecified Escherichia coli [E. coli] as the cause of diseases classified elsewhere: Secondary | ICD-10-CM

## 2013-10-20 DIAGNOSIS — R0609 Other forms of dyspnea: Secondary | ICD-10-CM

## 2013-10-20 DIAGNOSIS — M810 Age-related osteoporosis without current pathological fracture: Secondary | ICD-10-CM | POA: Diagnosis present

## 2013-10-20 DIAGNOSIS — R627 Adult failure to thrive: Secondary | ICD-10-CM

## 2013-10-20 DIAGNOSIS — I5022 Chronic systolic (congestive) heart failure: Secondary | ICD-10-CM

## 2013-10-20 DIAGNOSIS — J439 Emphysema, unspecified: Secondary | ICD-10-CM | POA: Diagnosis present

## 2013-10-20 DIAGNOSIS — J441 Chronic obstructive pulmonary disease with (acute) exacerbation: Secondary | ICD-10-CM | POA: Diagnosis present

## 2013-10-20 DIAGNOSIS — J90 Pleural effusion, not elsewhere classified: Secondary | ICD-10-CM | POA: Diagnosis present

## 2013-10-20 DIAGNOSIS — E43 Unspecified severe protein-calorie malnutrition: Secondary | ICD-10-CM

## 2013-10-20 DIAGNOSIS — R0603 Acute respiratory distress: Secondary | ICD-10-CM

## 2013-10-20 DIAGNOSIS — Z79899 Other long term (current) drug therapy: Secondary | ICD-10-CM

## 2013-10-20 DIAGNOSIS — I4729 Other ventricular tachycardia: Secondary | ICD-10-CM | POA: Diagnosis present

## 2013-10-20 DIAGNOSIS — I5043 Acute on chronic combined systolic (congestive) and diastolic (congestive) heart failure: Secondary | ICD-10-CM | POA: Diagnosis present

## 2013-10-20 DIAGNOSIS — J4489 Other specified chronic obstructive pulmonary disease: Secondary | ICD-10-CM

## 2013-10-20 DIAGNOSIS — I6529 Occlusion and stenosis of unspecified carotid artery: Secondary | ICD-10-CM

## 2013-10-20 DIAGNOSIS — M629 Disorder of muscle, unspecified: Secondary | ICD-10-CM

## 2013-10-20 DIAGNOSIS — I70209 Unspecified atherosclerosis of native arteries of extremities, unspecified extremity: Secondary | ICD-10-CM

## 2013-10-20 DIAGNOSIS — E875 Hyperkalemia: Secondary | ICD-10-CM | POA: Diagnosis present

## 2013-10-20 DIAGNOSIS — F4323 Adjustment disorder with mixed anxiety and depressed mood: Secondary | ICD-10-CM

## 2013-10-20 DIAGNOSIS — N390 Urinary tract infection, site not specified: Secondary | ICD-10-CM

## 2013-10-20 DIAGNOSIS — I498 Other specified cardiac arrhythmias: Secondary | ICD-10-CM | POA: Diagnosis present

## 2013-10-20 DIAGNOSIS — I10 Essential (primary) hypertension: Secondary | ICD-10-CM | POA: Diagnosis present

## 2013-10-20 DIAGNOSIS — I70219 Atherosclerosis of native arteries of extremities with intermittent claudication, unspecified extremity: Secondary | ICD-10-CM

## 2013-10-20 DIAGNOSIS — J189 Pneumonia, unspecified organism: Secondary | ICD-10-CM | POA: Diagnosis present

## 2013-10-20 DIAGNOSIS — M7989 Other specified soft tissue disorders: Secondary | ICD-10-CM

## 2013-10-20 DIAGNOSIS — J9610 Chronic respiratory failure, unspecified whether with hypoxia or hypercapnia: Secondary | ICD-10-CM

## 2013-10-20 DIAGNOSIS — I999 Unspecified disorder of circulatory system: Secondary | ICD-10-CM

## 2013-10-20 DIAGNOSIS — R918 Other nonspecific abnormal finding of lung field: Secondary | ICD-10-CM | POA: Diagnosis present

## 2013-10-20 DIAGNOSIS — M79662 Pain in left lower leg: Secondary | ICD-10-CM

## 2013-10-20 DIAGNOSIS — R748 Abnormal levels of other serum enzymes: Secondary | ICD-10-CM

## 2013-10-20 DIAGNOSIS — J9601 Acute respiratory failure with hypoxia: Secondary | ICD-10-CM

## 2013-10-20 DIAGNOSIS — I739 Peripheral vascular disease, unspecified: Secondary | ICD-10-CM

## 2013-10-20 DIAGNOSIS — I480 Paroxysmal atrial fibrillation: Secondary | ICD-10-CM

## 2013-10-20 DIAGNOSIS — D638 Anemia in other chronic diseases classified elsewhere: Secondary | ICD-10-CM

## 2013-10-20 DIAGNOSIS — J438 Other emphysema: Secondary | ICD-10-CM

## 2013-10-20 LAB — MRSA PCR SCREENING: MRSA by PCR: POSITIVE — AB

## 2013-10-20 LAB — BASIC METABOLIC PANEL
ANION GAP: 15 (ref 5–15)
Anion gap: 13 (ref 5–15)
BUN: 22 mg/dL (ref 6–23)
BUN: 23 mg/dL (ref 6–23)
CHLORIDE: 103 meq/L (ref 96–112)
CHLORIDE: 107 meq/L (ref 96–112)
CO2: 22 meq/L (ref 19–32)
CO2: 20 mEq/L (ref 19–32)
Calcium: 8 mg/dL — ABNORMAL LOW (ref 8.4–10.5)
Calcium: 8 mg/dL — ABNORMAL LOW (ref 8.4–10.5)
Creatinine, Ser: 0.95 mg/dL (ref 0.50–1.10)
Creatinine, Ser: 0.97 mg/dL (ref 0.50–1.10)
GFR calc Af Amer: 63 mL/min — ABNORMAL LOW (ref >90)
GFR calc Af Amer: 62 mL/min — ABNORMAL LOW (ref >90)
GFR calc non Af Amer: 55 mL/min — ABNORMAL LOW (ref >90)
GFR calc non Af Amer: 53 mL/min — ABNORMAL LOW (ref >90)
Glucose, Bld: 135 mg/dL — ABNORMAL HIGH (ref 70–99)
Glucose, Bld: 229 mg/dL — ABNORMAL HIGH (ref 70–99)
Potassium: 6.4 mEq/L — ABNORMAL HIGH (ref 3.7–5.3)
Potassium: 4.4 mEq/L (ref 3.7–5.3)
SODIUM: 142 meq/L (ref 137–147)
Sodium: 138 mEq/L (ref 137–147)

## 2013-10-20 LAB — PRO B NATRIURETIC PEPTIDE: PRO B NATRI PEPTIDE: 9906 pg/mL — AB (ref 0–450)

## 2013-10-20 LAB — BODY FLUID CELL COUNT WITH DIFFERENTIAL
EOS FL: 0 %
Lymphs, Fluid: 73 %
Monocyte-Macrophage-Serous Fluid: 22 % — ABNORMAL LOW (ref 50–90)
Neutrophil Count, Fluid: 5 % (ref 0–25)
Total Nucleated Cell Count, Fluid: 562 cu mm (ref 0–1000)

## 2013-10-20 LAB — CBC WITH DIFFERENTIAL/PLATELET
Basophils Absolute: 0 10*3/uL (ref 0.0–0.1)
Basophils Relative: 0 % (ref 0–1)
Eosinophils Absolute: 0.2 10*3/uL (ref 0.0–0.7)
Eosinophils Relative: 2 % (ref 0–5)
HEMATOCRIT: 40.2 % (ref 36.0–46.0)
Hemoglobin: 12.9 g/dL (ref 12.0–15.0)
LYMPHS PCT: 17 % (ref 12–46)
Lymphs Abs: 2.1 10*3/uL (ref 0.7–4.0)
MCH: 28.2 pg (ref 26.0–34.0)
MCHC: 32.1 g/dL (ref 30.0–36.0)
MCV: 88 fL (ref 78.0–100.0)
MONOS PCT: 8 % (ref 3–12)
Monocytes Absolute: 0.9 10*3/uL (ref 0.1–1.0)
NEUTROS ABS: 8.5 10*3/uL — AB (ref 1.7–7.7)
Neutrophils Relative %: 73 % (ref 43–77)
Platelets: 291 10*3/uL (ref 150–400)
RBC: 4.57 MIL/uL (ref 3.87–5.11)
RDW: 14.9 % (ref 11.5–15.5)
WBC: 11.8 10*3/uL — AB (ref 4.0–10.5)

## 2013-10-20 LAB — I-STAT TROPONIN, ED: Troponin i, poc: 0.02 ng/mL (ref 0.00–0.08)

## 2013-10-20 LAB — I-STAT CHEM 8, ED
BUN: 34 mg/dL — AB (ref 6–23)
CHLORIDE: 109 meq/L (ref 96–112)
CREATININE: 1.2 mg/dL — AB (ref 0.50–1.10)
Calcium, Ion: 0.98 mmol/L — ABNORMAL LOW (ref 1.13–1.30)
GLUCOSE: 140 mg/dL — AB (ref 70–99)
HCT: 44 % (ref 36.0–46.0)
HEMOGLOBIN: 15 g/dL (ref 12.0–15.0)
POTASSIUM: 6.5 meq/L — AB (ref 3.7–5.3)
Sodium: 137 mEq/L (ref 137–147)
TCO2: 25 mmol/L (ref 0–100)

## 2013-10-20 LAB — TSH: TSH: 1.23 u[IU]/mL (ref 0.350–4.500)

## 2013-10-20 LAB — PROTEIN, BODY FLUID: TOTAL PROTEIN, FLUID: 1.5 g/dL

## 2013-10-20 LAB — LACTATE DEHYDROGENASE, PLEURAL OR PERITONEAL FLUID: LD, Fluid: 57 U/L — ABNORMAL HIGH (ref 3–23)

## 2013-10-20 LAB — ALBUMIN: ALBUMIN: 3.1 g/dL — AB (ref 3.5–5.2)

## 2013-10-20 LAB — MAGNESIUM: MAGNESIUM: 2.2 mg/dL (ref 1.5–2.5)

## 2013-10-20 MED ORDER — METHYLPREDNISOLONE SODIUM SUCC 125 MG IJ SOLR
125.0000 mg | Freq: Once | INTRAMUSCULAR | Status: AC
  Administered 2013-10-20: 125 mg via INTRAVENOUS
  Filled 2013-10-20: qty 2

## 2013-10-20 MED ORDER — METOPROLOL SUCCINATE ER 50 MG PO TB24
50.0000 mg | ORAL_TABLET | Freq: Every day | ORAL | Status: DC
  Administered 2013-10-21 – 2013-10-22 (×2): 50 mg via ORAL
  Filled 2013-10-20 (×3): qty 1

## 2013-10-20 MED ORDER — ALBUTEROL SULFATE (2.5 MG/3ML) 0.083% IN NEBU
5.0000 mg | INHALATION_SOLUTION | Freq: Once | RESPIRATORY_TRACT | Status: AC
  Administered 2013-10-20: 5 mg via RESPIRATORY_TRACT
  Filled 2013-10-20: qty 6

## 2013-10-20 MED ORDER — DEXTROSE 5 % IV SOLN
500.0000 mg | INTRAVENOUS | Status: DC
  Administered 2013-10-21: 500 mg via INTRAVENOUS
  Filled 2013-10-20: qty 500

## 2013-10-20 MED ORDER — PREDNISONE 20 MG PO TABS
40.0000 mg | ORAL_TABLET | Freq: Every day | ORAL | Status: DC
  Administered 2013-10-21: 40 mg via ORAL
  Filled 2013-10-20 (×2): qty 2

## 2013-10-20 MED ORDER — LEVALBUTEROL HCL 1.25 MG/0.5ML IN NEBU: 1.2500 mg | INHALATION_SOLUTION | Freq: Four times a day (QID) | RESPIRATORY_TRACT | Status: DC

## 2013-10-20 MED ORDER — AMIODARONE HCL 200 MG PO TABS
200.0000 mg | ORAL_TABLET | Freq: Every day | ORAL | Status: DC
  Administered 2013-10-20 – 2013-10-22 (×3): 200 mg via ORAL
  Filled 2013-10-20 (×3): qty 1

## 2013-10-20 MED ORDER — ALBUTEROL SULFATE HFA 108 (90 BASE) MCG/ACT IN AERS: 2.0000 | INHALATION_SPRAY | RESPIRATORY_TRACT | Status: DC | PRN

## 2013-10-20 MED ORDER — ASPIRIN EC 81 MG PO TBEC
81.0000 mg | DELAYED_RELEASE_TABLET | Freq: Every day | ORAL | Status: DC
  Administered 2013-10-20 – 2013-10-22 (×3): 81 mg via ORAL
  Filled 2013-10-20 (×3): qty 1

## 2013-10-20 MED ORDER — CALCIUM CARBONATE-VITAMIN D 500-200 MG-UNIT PO TABS
1.0000 | ORAL_TABLET | Freq: Every morning | ORAL | Status: DC
  Administered 2013-10-20 – 2013-10-22 (×3): 1 via ORAL
  Filled 2013-10-20 (×3): qty 1

## 2013-10-20 MED ORDER — ACETAMINOPHEN 650 MG RE SUPP: 650.0000 mg | Freq: Four times a day (QID) | RECTAL | Status: DC | PRN

## 2013-10-20 MED ORDER — OXYCODONE HCL 5 MG PO TABS: 5.0000 mg | ORAL_TABLET | ORAL | Status: DC | PRN

## 2013-10-20 MED ORDER — DOCUSATE SODIUM 100 MG PO CAPS
300.0000 mg | ORAL_CAPSULE | Freq: Every day | ORAL | Status: DC
  Administered 2013-10-20 – 2013-10-22 (×3): 300 mg via ORAL
  Filled 2013-10-20 (×3): qty 3

## 2013-10-20 MED ORDER — ALBUTEROL SULFATE (2.5 MG/3ML) 0.083% IN NEBU: 2.5000 mg | INHALATION_SOLUTION | Freq: Four times a day (QID) | RESPIRATORY_TRACT | Status: DC

## 2013-10-20 MED ORDER — SODIUM POLYSTYRENE SULFONATE 15 GM/60ML PO SUSP
30.0000 g | Freq: Once | ORAL | Status: AC
  Administered 2013-10-20: 30 g via ORAL
  Filled 2013-10-20: qty 120

## 2013-10-20 MED ORDER — ACETAMINOPHEN 325 MG PO TABS
650.0000 mg | ORAL_TABLET | Freq: Four times a day (QID) | ORAL | Status: DC | PRN
  Administered 2013-10-21: 650 mg via ORAL
  Filled 2013-10-20: qty 2

## 2013-10-20 MED ORDER — METHYLPREDNISOLONE SODIUM SUCC 125 MG IJ SOLR
60.0000 mg | INTRAMUSCULAR | Status: DC
  Filled 2013-10-20: qty 0.96

## 2013-10-20 MED ORDER — PANTOPRAZOLE SODIUM 40 MG PO TBEC
40.0000 mg | DELAYED_RELEASE_TABLET | Freq: Every day | ORAL | Status: DC
  Administered 2013-10-20 – 2013-10-22 (×3): 40 mg via ORAL
  Filled 2013-10-20 (×3): qty 1

## 2013-10-20 MED ORDER — IPRATROPIUM-ALBUTEROL 0.5-2.5 (3) MG/3ML IN SOLN: 3.0000 mL | Freq: Once | RESPIRATORY_TRACT | Status: DC

## 2013-10-20 MED ORDER — ONDANSETRON HCL 4 MG/2ML IJ SOLN
4.0000 mg | Freq: Once | INTRAMUSCULAR | Status: AC
  Administered 2013-10-20: 4 mg via INTRAVENOUS
  Filled 2013-10-20: qty 2

## 2013-10-20 MED ORDER — ONDANSETRON HCL 4 MG/2ML IJ SOLN: 4.0000 mg | Freq: Three times a day (TID) | INTRAMUSCULAR | Status: AC | PRN

## 2013-10-20 MED ORDER — DEXTROSE 5 % IV SOLN
1.0000 g | Freq: Once | INTRAVENOUS | Status: AC
  Administered 2013-10-20: 1 g via INTRAVENOUS
  Filled 2013-10-20: qty 10

## 2013-10-20 MED ORDER — SODIUM CHLORIDE 0.9 % IV SOLN
INTRAVENOUS | Status: DC
  Administered 2013-10-20: 1000 mL via INTRAVENOUS

## 2013-10-20 MED ORDER — IOHEXOL 350 MG/ML SOLN
80.0000 mL | Freq: Once | INTRAVENOUS | Status: AC | PRN
  Administered 2013-10-20: 57 mL via INTRAVENOUS

## 2013-10-20 MED ORDER — LIDOCAINE HCL (PF) 1 % IJ SOLN
INTRAMUSCULAR | Status: AC
  Filled 2013-10-20: qty 10

## 2013-10-20 MED ORDER — DEXTROSE 5 % IV SOLN
1.0000 g | INTRAVENOUS | Status: DC
  Administered 2013-10-21: 1 g via INTRAVENOUS
  Filled 2013-10-20 (×2): qty 10

## 2013-10-20 MED ORDER — ONDANSETRON HCL 4 MG/2ML IJ SOLN
4.0000 mg | INTRAMUSCULAR | Status: AC
  Administered 2013-10-20: 4 mg via INTRAVENOUS
  Filled 2013-10-20: qty 2

## 2013-10-20 MED ORDER — ALBUTEROL SULFATE (2.5 MG/3ML) 0.083% IN NEBU: 2.5000 mg | INHALATION_SOLUTION | RESPIRATORY_TRACT | Status: DC | PRN

## 2013-10-20 MED ORDER — LISINOPRIL 10 MG PO TABS: 5.0000 mg | ORAL_TABLET | Freq: Every day | ORAL | Status: DC

## 2013-10-20 MED ORDER — SILVER SULFADIAZINE 1 % EX CREA
1.0000 "application " | TOPICAL_CREAM | Freq: Every day | CUTANEOUS | Status: DC
  Administered 2013-10-21 – 2013-10-22 (×2): 1 via TOPICAL
  Filled 2013-10-20: qty 85

## 2013-10-20 MED ORDER — IPRATROPIUM BROMIDE 0.02 % IN SOLN
0.5000 mg | Freq: Once | RESPIRATORY_TRACT | Status: AC
  Administered 2013-10-20: 0.5 mg via RESPIRATORY_TRACT
  Filled 2013-10-20: qty 2.5

## 2013-10-20 MED ORDER — ALBUTEROL (5 MG/ML) CONTINUOUS INHALATION SOLN
10.0000 mg/h | INHALATION_SOLUTION | RESPIRATORY_TRACT | Status: AC
  Administered 2013-10-20: 10 mg/h via RESPIRATORY_TRACT
  Filled 2013-10-20: qty 20

## 2013-10-20 MED ORDER — DM-GUAIFENESIN ER 30-600 MG PO TB12
1.0000 | ORAL_TABLET | Freq: Two times a day (BID) | ORAL | Status: DC
  Administered 2013-10-20 – 2013-10-22 (×5): 1 via ORAL
  Filled 2013-10-20 (×6): qty 1

## 2013-10-20 MED ORDER — LEVALBUTEROL HCL 1.25 MG/0.5ML IN NEBU
1.2500 mg | INHALATION_SOLUTION | Freq: Four times a day (QID) | RESPIRATORY_TRACT | Status: DC
  Administered 2013-10-20 – 2013-10-22 (×7): 1.25 mg via RESPIRATORY_TRACT
  Filled 2013-10-20 (×14): qty 0.5

## 2013-10-20 MED ORDER — AZITHROMYCIN 250 MG PO TABS
500.0000 mg | ORAL_TABLET | Freq: Once | ORAL | Status: AC
  Administered 2013-10-20: 500 mg via ORAL
  Filled 2013-10-20: qty 2
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
## 2013-10-20 NOTE — Care Management Note (Signed)
    Page 1 of 1   10/20/2013     4:17:16 PM CARE MANAGEMENT NOTE 10/20/2013  Patient:  [name], [name]   Account Number:  [id number]  Date Initiated:  10/20/2013  Documentation initiated by:  [facility]  Subjective/Objective Assessment:   80 y.o. WF PMHx depressive disorder, combined systolic and diastolic CHF chronic, HTN, atrial fibrillation or flutter. States shortness of breath started approx 2 weeks ago.//Home alone     Action/Plan:   -Continue O2 via [place] to maintain SpO2> 92%  -Xopenex QID  -Solu-Medrol 60 mg daily  -IV antibiotics//Access for disposition needs.   Anticipated DC Date:  10/23/2013   Anticipated DC Plan:  [place] referral  Clinical Social Worker      DC Planning Services  CM consult      Choice offered to / List presented to:             Status of service:  In process, will continue to follow Medicare Important Message given?   (If response is "NO", the following Medicare IM given date fields will be blank) Date Medicare IM given:   Medicare IM given by:   Date Additional Medicare IM given:   Additional Medicare IM given by:    Discharge Disposition:    Per UR Regulation:  Reviewed for med. necessity/level of care/duration of stay  If discussed at [place] of Stay Meetings, dates discussed:    Comments:

## 2013-10-20 NOTE — ED Notes (Signed)
Patient transported to CT 

## 2013-10-20 NOTE — ED Notes (Signed)
Pt from home. Started having SOB around 10pm. Called EMS. EMS found pt's O2 sats 86-87 on room air. Pt placed on NRB, brought up to 98%, then on [place] on 4l at 96%. Pt anxious at this time.

## 2013-10-20 NOTE — ED Notes (Signed)
Pt returned from CT °
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
## 2013-10-20 NOTE — Progress Notes (Signed)
Lab notified RN that orders for cytology needed to be placed.  MD notified.

## 2013-10-20 NOTE — Procedures (Signed)
US guided diagnostic right thoracentesis performed yielding 380 cc's slightly turbid,amber fluid. The fluid was sent to the lab for preordered studies. F/u CXR pending. No immediate complications. Due to soft BP, only the above amount of fluid was removed at this time.

## 2013-10-20 NOTE — ED Notes (Signed)
Pt to ultrasound and then will be taken to her room upstairs, ultrasound an Child psychotherapist from [place] are aware of plan

## 2013-10-20 NOTE — ED Notes (Signed)
Flow Manager called to inform RN that pt is waiting for stepdown bed and there are none available at this time. RN informed charge RN of pt's changed status to stepdown.

## 2013-10-20 NOTE — Progress Notes (Signed)
Pt seen and examined, admitted this am with Dyspnea. Has chronic systolic CHF, EF of 44%, COPD, former smoker. Now with R Lung mass, Pleural effusion R>L, COPD exacerbation S/p Diagnostic and therapeutic thoracentesis, FU cell count/diff and cytology Continue Rocephin/Zithromax, Wean Steroids, nebs   Domenic Polite, MD [phone]

## 2013-10-21 ENCOUNTER — Ambulatory Visit: Payer: Self-pay | Admitting: Internal Medicine

## 2013-10-21 DIAGNOSIS — J9610 Chronic respiratory failure, unspecified whether with hypoxia or hypercapnia: Secondary | ICD-10-CM

## 2013-10-21 DIAGNOSIS — D638 Anemia in other chronic diseases classified elsewhere: Secondary | ICD-10-CM

## 2013-10-21 LAB — BASIC METABOLIC PANEL
Anion gap: 11 (ref 5–15)
BUN: 28 mg/dL — ABNORMAL HIGH (ref 6–23)
CO2: 23 mEq/L (ref 19–32)
Calcium: 7.6 mg/dL — ABNORMAL LOW (ref 8.4–10.5)
Chloride: 105 mEq/L (ref 96–112)
Creatinine, Ser: 1.08 mg/dL (ref 0.50–1.10)
GFR calc Af Amer: 54 mL/min — ABNORMAL LOW (ref >90)
GFR, EST NON AFRICAN AMERICAN: 47 mL/min — AB (ref >90)
GLUCOSE: 129 mg/dL — AB (ref 70–99)
POTASSIUM: 4.2 meq/L (ref 3.7–5.3)
SODIUM: 139 meq/L (ref 137–147)

## 2013-10-21 LAB — CBC WITH DIFFERENTIAL/PLATELET
BASOS ABS: 0 10*3/uL (ref 0.0–0.1)
Basophils Relative: 0 % (ref 0–1)
EOS ABS: 0 10*3/uL (ref 0.0–0.7)
Eosinophils Relative: 0 % (ref 0–5)
HCT: 34.6 % — ABNORMAL LOW (ref 36.0–46.0)
Hemoglobin: 10.8 g/dL — ABNORMAL LOW (ref 12.0–15.0)
LYMPHS PCT: 6 % — AB (ref 12–46)
Lymphs Abs: 0.9 10*3/uL (ref 0.7–4.0)
MCH: 28.4 pg (ref 26.0–34.0)
MCHC: 31.2 g/dL (ref 30.0–36.0)
MCV: 91.1 fL (ref 78.0–100.0)
Monocytes Absolute: 0.7 10*3/uL (ref 0.1–1.0)
Monocytes Relative: 4 % (ref 3–12)
Neutro Abs: 14.2 10*3/uL — ABNORMAL HIGH (ref 1.7–7.7)
Neutrophils Relative %: 90 % — ABNORMAL HIGH (ref 43–77)
PLATELETS: 278 10*3/uL (ref 150–400)
RBC: 3.8 MIL/uL — ABNORMAL LOW (ref 3.87–5.11)
RDW: 15.2 % (ref 11.5–15.5)
WBC: 15.7 10*3/uL — AB (ref 4.0–10.5)

## 2013-10-21 LAB — LACTATE DEHYDROGENASE: LDH: 413 U/L — AB (ref 94–250)

## 2013-10-21 LAB — PROTEIN, TOTAL: TOTAL PROTEIN: 6.2 g/dL (ref 6.0–8.3)

## 2013-10-21 MED ORDER — BOOST / RESOURCE BREEZE PO LIQD
1.0000 | Freq: Two times a day (BID) | ORAL | Status: DC
  Administered 2013-10-22 (×2): 1 via ORAL

## 2013-10-21 MED ORDER — FUROSEMIDE 10 MG/ML IJ SOLN
20.0000 mg | Freq: Two times a day (BID) | INTRAMUSCULAR | Status: DC
Start: 2013-10-21 — End: 2013-10-22
  Administered 2013-10-21 – 2013-10-22 (×2): 20 mg via INTRAVENOUS
  Filled 2013-10-21 (×2): qty 2

## 2013-10-21 MED ORDER — CHLORHEXIDINE GLUCONATE CLOTH 2 % EX PADS
6.0000 | MEDICATED_PAD | Freq: Every day | CUTANEOUS | Status: DC
  Administered 2013-10-21 – 2013-10-22 (×2): 6 via TOPICAL

## 2013-10-21 MED ORDER — MUPIROCIN 2 % EX OINT
1.0000 "application " | TOPICAL_OINTMENT | Freq: Two times a day (BID) | CUTANEOUS | Status: DC
  Administered 2013-10-21 – 2013-10-22 (×3): 1 via NASAL
  Filled 2013-10-21: qty 22

## 2013-10-21 NOTE — Progress Notes (Signed)
Report given to PM nurse.  Pt A&O, watching TV while lying in bed.

## 2013-10-21 NOTE — Clinical Documentation Improvement (Signed)
MD's, NP's, and PA's Noted documentation of "respiratory distress" if possible please document the "acuity" of this diagnosis if appropriate.  Thank you    Possible Clinical Conditions?  Acute Respiratory Distress  Chronic Respiratory Failure  Moderated Respiratory Distress  Acute on Chronic Respiratory Failure  Other Condition  Cannot Clinically Determine    Risk Factors: COPD Exacerbation, Possible Pneumonia, Acute on Chronic combined systolic and diastolic heart failure   Signs & Symptoms: "SOB....." pt's O2 sats 86-87 on room air".  Treatment:  Pt placed on NRB, brought up to 98%, then on Volin on 4l at 96%. Xopenex,    Thank You, Ree Kida ,RN Clinical Documentation Specialist:  [place] [employer]

## 2013-10-21 NOTE — Progress Notes (Addendum)
Pt's BP 96/56, HR 93.  Per verbal order via attending physician, ok to admin the ordered Amiodarone 200mg  PO and Metoprolol 50mg  PO.  Hold the ordered Lasix 20MG  IV for 2 hours and reassess to see if BP rises.

## 2013-10-21 NOTE — Progress Notes (Signed)
Co-signed for Glean Hess, RN/BSN for medication administration, assessments, Vital signs, I's & O's, progress notes, care plans, and patient education. Tonny Branch, RN/BSN

## 2013-10-21 NOTE — Evaluation (Signed)
Physical Therapy Evaluation Patient Details Name: [name] MRN: [id number] DOB: 1929-08-17 Today's Date: 10/21/2013   History of Present Illness  Pt is a 80 y.o. WF PMHx depressive disorder, combined systolic and diastolic CHF chronic, HTN, atrial fibrillation or flutter, Hx right breast cancer S./P. Mastectomy 1999 no XRT/Chemo, Hx skin cancer, Hx pleural effusion (seen by Dr. Christinia Gully [facility] pulmonology 11/22/2011) PAD. States shortness of breath started approximately 2 weeks ago and continued to worsen to the point that she felt she needed to call 911 today. Positive productive cough (white), positive N./V. negative fever States also has lost 30 pounds in the last 18 months.  Clinical Impression  Pt admitted with the above. Pt currently with functional limitations due to the deficits listed below (see PT Problem List). At the time of PT eval pt was able to perform mobility and ambulation with +1 min assist. Pt has caregivers available more for housekeeping tasks x5 days/week. Pt states she is able to perform ADL's on her own. Pt will benefit from skilled PT to increase their independence and safety with mobility to allow discharge to the venue listed below.       Follow Up Recommendations Home health PT;Supervision - Intermittent    Equipment Recommendations  None recommended by PT    Recommendations for Other Services       Precautions / Restrictions Precautions Precautions: Fall Restrictions Weight Bearing Restrictions: No      Mobility  Bed Mobility Overal bed mobility: Modified Independent             General bed mobility comments: Increased time and use of bed rails required for safety.   Transfers Overall transfer level: Modified independent Equipment used: None             General transfer comment: Pt able to power up to fulls tanding position without physical assist. Reqired time to gait balance at EOB but no LOB and again, no assist required.    Ambulation/Gait Ambulation/Gait assistance: Min assist;+2 safety/equipment Ambulation Distance (Feet): 175 Feet Assistive device: 1 person hand held assist Gait Pattern/deviations: Step-through pattern;Decreased stride length;Trunk flexed;Narrow base of support Gait velocity: Decreased Gait velocity interpretation: Below normal speed for age/gender General Gait Details: Pt was able to ambulate in the halls with HHA and occasional min assist for balance.   Stairs            Wheelchair Mobility    Modified Rankin (Stroke Patients Only)       Balance Overall balance assessment: Needs assistance Sitting-balance support: Feet supported;No upper extremity supported Sitting balance-Leahy Scale: Fair     Standing balance support: No upper extremity supported Standing balance-Leahy Scale: Fair                               Pertinent Vitals/Pain Pain Assessment: No/denies pain    Home Living Family/patient expects to be discharged to:: Private residence Living Arrangements: Alone Available Help at Discharge: Personal care attendant (10-330 5x/week ) Type of Home: House Home Access: Stairs to enter Entrance Stairs-Rails: Right;Left;Can reach both Entrance Stairs-Number of Steps: 3 Home Layout: One level Home Equipment: Cane - single point;Walker - 2 wheels;Toilet riser      Prior Function Level of Independence: Independent with assistive device(s)         Comments: States she can do all of her ADL's herself on the days that the aides come. Just slowly. 2 falls in the  past year.      Hand Dominance   Dominant Hand: Right    Extremity/Trunk Assessment   Upper Extremity Assessment: Defer to OT evaluation           Lower Extremity Assessment: Generalized weakness      Cervical / Trunk Assessment: Normal  Communication   Communication: No difficulties  Cognition Arousal/Alertness: Awake/alert Behavior During Therapy: WFL for tasks  assessed/performed Overall Cognitive Status: Within Functional Limits for tasks assessed                      General Comments      Exercises General Exercises - Lower Extremity Ankle Circles/Pumps: 15 reps Long Arc Quad: 15 reps Hip ABduction/ADduction: 15 reps Straight Leg Raises: 10 reps      Assessment/Plan    PT Assessment Patient needs continued PT services  PT Diagnosis Difficulty walking;Generalized weakness   PT Problem List Decreased strength;Decreased range of motion;Decreased activity tolerance;Decreased balance;Decreased mobility;Decreased knowledge of use of DME;Decreased safety awareness;Decreased knowledge of precautions  PT Treatment Interventions DME instruction;Gait training;Stair training;Functional mobility training;Therapeutic activities;Therapeutic exercise;Neuromuscular re-education;Patient/family education   PT Goals (Current goals can be found in the Care Plan section) Acute Rehab PT Goals Patient Stated Goal: To return to PLOF PT Goal Formulation: With patient Time For Goal Achievement: 10/28/13 Potential to Achieve Goals: Good    Frequency Min 3X/week   Barriers to discharge        Co-evaluation               End of Session Equipment Utilized During Treatment: Gait belt Activity Tolerance: Patient tolerated treatment well Patient left: in chair;with call bell/phone within reach;with family/visitor present Nurse Communication: Mobility status         Time: 1424-1500 PT Time Calculation (min): 36 min   Charges:   PT Evaluation $Initial PT Evaluation Tier I: 1 Procedure PT Treatments $Gait Training: 8-22 mins $Therapeutic Exercise: 8-22 mins   PT G CodesJolyn Lent 10/21/2013, 4:03 PM  Jolyn Lent, PT, DPT Acute Rehabilitation Services Pager: [phone]
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
## 2013-10-21 NOTE — Clinical Documentation Improvement (Signed)
MD's, NP's, and PA's  In ED noted P (105 -110) R ( 30-24) and BP ( 88/75/113/73/ 98/58/106/62/96/56) with a WBC  11.8  up to 15.6 within 24 hours.  IV Rocephin given, and Azithromycin ordered.  If diagnosis below is appropriate please document in notes. Thank you  Possible Clinical Conditions?  Septicemia / Sepsis  SIRS  Bacterial infection of unknown etiology / source  Other Condition   Cannot clinically Determine    Risk Factors: Respiratory distress, possible Pneumonia, Protein Calorie Malnutrition, COPD exacerbation  Thank You, Ree Kida ,RN Clinical Documentation Specialist:  [place] Information Management

## 2013-10-21 NOTE — Progress Notes (Signed)
BP 85/52.  HR 83.   Per attending physician, continue to hold Lasix 20mg  IV.

## 2013-10-21 NOTE — Progress Notes (Addendum)
[facility] PROGRESS NOTE  TAMILA GAULIN W[facility]:[id number] DOB: May 20, 1929 DOA: 10/20/2013 PCP: Gerrit Heck, MD  Brief Narrative: [name] is a 80 y.o. WF with PMH of chronic systolic and diastolic CHF EF of 35%, COPD HTN, atrial fibrillation or flutter, Hx right breast cancer S./P. Mastectomy 1999, Hx pleural effusion, AD, presented to the ER with Dyspnea progressive over  2 weeks, associated with productive cough and weight loss. Ct with R lung mass and mod R pleural effusion. Underwent Thoracentesis per IR 8/31  Assessment/Plan: 1. R lung mass with pleural effusion -likely malignant effusion, s/p Thoracentesis 8/31 -FU cytology, add serum protein and LDH to help decipher if exudate vs transudate -If cytology negative would consult Pulm for biopsy or repeat thora  2. ACute on chronic systolic CHF -gentle lasix as BP tolerates -follow I/Os, weights -EF 20-25% based on ECHO 1/15 -add Low dose ACE when BP tolerates  3. COPD -stable, nebs PRN -do not suspect CAP/Exacerbation -wean Off steroids and stop Abx  4. P.Afib -rate controlled -continue Metoprolol and amiodarone -not on Anticoagulation, followed by Cards, continue ASA  Code Status: DNR Family Communication: none at bedside Disposition Plan: home pending workup   Consultants:  IR  Procedures: Thoracentesis 8/31  HPI/Subjective: Breathing better since thoracentesis yesterday  Objective: Filed Vitals:   10/21/13 0205  BP: 100/68  Pulse: 88  Temp: 98.4 F (36.9 C)  Resp: 17    Intake/Output Summary (Last 24 hours) at 10/21/13 1012 Last data filed at 10/21/13 0900  Gross per 24 hour  Intake  42496 ml  Output    550 ml  Net  41946 ml   Filed Weights   10/20/13 1050 10/21/13 0515  Weight: 40.9 kg (90 lb 2.7 oz) 41.141 kg (90 lb 11.2 oz)    Exam:   General:  AAOx3, thinly built, frail, no distress  Cardiovascular: S1S2/RRR  Respiratory: crackles in both lower lung  zones  Abdomen: soft, NT, BS present  Musculoskeletal:no edema c/c   Data Reviewed: Basic Metabolic Panel:  Recent Labs Lab 10/20/13 0138 10/20/13 0144 10/20/13 0633 10/21/13 0622  NA 138 137 142 139  K 6.4* 6.5* 4.4 4.2  CL 103 109 107 105  CO2 22  --  20 23  GLUCOSE 135* 140* 229* 129*  BUN 22 34* 23 28*  CREATININE 0.95 1.20* 0.97 1.08  CALCIUM 8.0*  --  8.0* 7.6*  MG  --   --  2.2  --    Liver Function Tests:  Recent Labs Lab 10/20/13 0633 10/21/13 0615  PROT  --  6.2  ALBUMIN 3.1*  --    No results found for this basename: LIPASE, AMYLASE,  in the last 168 hours No results found for this basename: AMMONIA,  in the last 168 hours CBC:  Recent Labs Lab 10/20/13 0138 10/20/13 0144 10/21/13 0622  WBC 11.8*  --  15.7*  NEUTROABS 8.5*  --  14.2*  HGB 12.9 15.0 10.8*  HCT 40.2 44.0 34.6*  MCV 88.0  --  91.1  PLT 291  --  278   Cardiac Enzymes: No results found for this basename: CKTOTAL, CKMB, CKMBINDEX, TROPONINI,  in the last 168 hours BNP (last 3 results)  Recent Labs  03/05/13 0705 03/06/13 0226 10/20/13 0138  PROBNP 12653.0* 29707.0* 9906.0*   CBG: No results found for this basename: GLUCAP,  in the last 168 hours  Recent Results (from the past 240 hour(s))  BODY FLUID CULTURE     Status:  None   Collection Time    10/20/13 10:27 AM      Result Value Ref Range Status   Specimen Description PLEURAL FLUID RIGHT   Final   Special Requests 60ML FLUID   Final   Gram Stain     Final   Value: RARE WBC PRESENT, PREDOMINANTLY MONONUCLEAR     NO ORGANISMS SEEN     Performed at [facility]   Culture PENDING   Incomplete   Report Status PENDING   Incomplete  MRSA PCR SCREENING     Status: Abnormal   Collection Time    10/20/13  1:02 PM      Result Value Ref Range Status   MRSA by PCR POSITIVE (*) NEGATIVE Final   Comment:            The GeneXpert MRSA Assay (FDA     approved for NASAL specimens     only), is one component of a      comprehensive MRSA colonization     surveillance program. It is not     intended to diagnose MRSA     infection nor to guide or     monitor treatment for     MRSA infections.     RESULT CALLED TO, READ BACK BY AND VERIFIED WITH:     T. FAIRING RN 14:30 10/20/13 ([email or web address])     Studies: Dg Chest 1 View  10/20/2013   CLINICAL DATA:  Status post right thoracentesis  EXAM: CHEST - 1 VIEW  COMPARISON:  Radiograph 10/20/2013 at 158 hr, CT 10/21/2003  FINDINGS: There is decrease pleural fluid on the right. There is a mass at the right lung base measuring 4.5 cm which is noted on recent CT. No appreciable pneumothorax. Moderate left effusion remains.  IMPRESSION: 1. Decrease in right pleural fluid and no pneumothorax following thoracentesis. 2. Persistent small to moderate left effusion. 3. Right lower lobe mass compares to recent CT.   Electronically Signed   By: Suzy Bouchard M.D.   On: 10/20/2013 10:35   Ct Angio Chest Pe W/cm &/or Wo Cm  10/20/2013   CLINICAL DATA:  Shortness of breath  EXAM: CT ANGIOGRAPHY CHEST WITH CONTRAST  TECHNIQUE: Multidetector CT imaging of the chest was performed using the standard protocol during bolus administration of intravenous contrast. Multiplanar CT image reconstructions and MIPs were obtained to evaluate the vascular anatomy.  CONTRAST:  64mL OMNIPAQUE IOHEXOL 350 MG/ML SOLN  COMPARISON:  Same day chest radiograph  FINDINGS: Degraded by respiratory motion. No pulmonary embolism identified. Some of the peripheral (distal segmental and subsegmental) branches are poorly evaluated given the motion.  Normal caliber aorta and branch vessels with scattered atherosclerotic disease.  Enlarged cardiac silhouette with left ventricular hypertrophy. Aortic valvular and coronary artery calcifications. Trace pericardial fluid.  Distal esophageal wall thickening. Moderate right and small left pleural effusions. Indeterminate thyroid nodules.  Central airways are patent. Bilateral  peribronchial thickening, lower lobe predominant. Cannot confirmed lower lobe bronchi patency.  Advanced emphysema.  No pneumothorax.  7 x 8 mm nodular opacity right lower lobe series 6, image 18 is nonspecific. Interlobular septal thickening. Hazy airspace opacities within the middle lobe, lingula, lung bases. More confluent lung base opacities.  Peripheral opacity right lower lobe measuring 4.6 x 3.7 cm is concerning for a mass (series 4, image 69).  There are prominent right hilar and subcarinal nodes measuring 1.7 and 1.9 cm short axis respectively.  Upper abdominal images show an atrophic left kidney.  Diffuse osteopenia. Multilevel compression fractures, including T2 and T9.  Review of the MIP images confirms the above findings.  IMPRESSION: No pulmonary embolism identified. Some of the peripheral most branches are poorly evaluated to respiratory motion.  Findings are worrisome for a right lower lobe mass measuring 4.6 cm.  Right hilar and subcarinal adenopathy, metastatic disease not excluded.  Moderate right and small left pleural effusions.  Pulmonary edema superimposed on advanced emphysema.  Lower lobe opacities may reflect atelectasis, aspiration, or infiltrate.  Cardiomegaly.   Electronically Signed   By: Carlos Levering M.D.   On: 10/20/2013 03:36   Dg Chest Port 1 View  10/20/2013   CLINICAL DATA:  Shortness of breath  EXAM: PORTABLE CHEST - 1 VIEW  COMPARISON:  03/05/2013  FINDINGS: Enlarged cardiac silhouette. Central vascular congestion. Aortic atherosclerosis. COPD. Superimposed interstitial and airspace opacities, most pronounced within the right lower lobe. Right greater left pleural effusions. Apical bullae. No large pneumothorax. Surgical clips right axilla. Diffuse osteopenia.  IMPRESSION: COPD. Superimposed interstitial and airspace opacities may reflect pulmonary edema and/or multifocal pneumonia.  Right greater than left pleural effusions.   Electronically Signed   By: Carlos Levering  M.D.   On: 10/20/2013 02:15   US Thoracentesis Asp Pleural Space W/img Guide  10/20/2013   CLINICAL DATA:  Patient with history of COPD, remote right breast carcinoma, right greater than left pleural effusions, dyspnea. Request is made for diagnostic right thoracentesis.  EXAM: ULTRASOUND GUIDED DIAGNOSTIC RIGHT THORACENTESIS  COMPARISON:  None.  PROCEDURE: An ultrasound guided thoracentesis was thoroughly discussed with the patient and questions answered. The benefits, risks, alternatives and complications were also discussed. The patient understands and wishes to proceed with the procedure. Written consent was obtained.  Ultrasound was performed to localize and mark an adequate pocket of fluid in the right chest. The area was then prepped and draped in the normal sterile fashion. 1% Lidocaine was used for local anesthesia. Under ultrasound guidance a 19 gauge Yueh catheter was introduced. Thoracentesis was performed. The catheter was removed and a dressing applied.  Complications:  None.  FINDINGS: A total of approximately 380 cc's of slightly turbid, amber fluid was removed. The fluid sample wassent for laboratory analysis. Due to the patient's hypotension only the above amount of fluid was removed at this time.  IMPRESSION: Successful ultrasound guided diagnostic right thoracentesis yielding 380 cc's of pleural fluid.  Read by: Rowe Robert, PA-C   Electronically Signed   By: Aletta Edouard M.D.   On: 10/20/2013 10:17    Scheduled Meds: . amiodarone  200 mg Oral Daily  . aspirin EC  81 mg Oral Daily  . calcium-vitamin D  1 tablet Oral q morning - 10a  . Chlorhexidine Gluconate Cloth  6 each Topical Q0600  . dextromethorphan-guaiFENesin  1 tablet Oral BID  . docusate sodium  300 mg Oral Daily  . furosemide  20 mg Intravenous Q12H  . levalbuterol  1.25 mg Nebulization 4 times per day  . metoprolol succinate  50 mg Oral Daily  . mupirocin ointment  1 application Nasal BID  . pantoprazole  40 mg  Oral Daily  . silver sulfADIAZINE  1 application Topical Daily   Continuous Infusions:  Antibiotics Given (last 72 hours)   Date/Time Action Medication Dose   10/20/13 0413 Given   azithromycin (ZITHROMAX) tablet 500 mg 500 mg      Active Problems:   Hypertension   Severe protein-calorie malnutrition   Atrial fibrillation   Pleural effusion  Adjustment disorder with mixed anxiety and depressed mood   SVT (supraventricular tachycardia)   Respiratory distress   Emphysema/COPD   Chronic combined systolic and diastolic CHF (congestive heart failure)   Right lower lobe lung mass    Time spent: 40min    [facility] Pager [phone]. If 7PM-7AM, please contact night-coverage at [email or web address], password [facility] 10/21/2013, 10:12 AM  LOS: 1 day

## 2013-10-22 LAB — CBC WITH DIFFERENTIAL/PLATELET
Basophils Absolute: 0 10*3/uL (ref 0.0–0.1)
Basophils Relative: 0 % (ref 0–1)
Eosinophils Absolute: 0.1 10*3/uL (ref 0.0–0.7)
Eosinophils Relative: 0 % (ref 0–5)
HCT: 34.3 % — ABNORMAL LOW (ref 36.0–46.0)
HEMOGLOBIN: 11.1 g/dL — AB (ref 12.0–15.0)
LYMPHS ABS: 1 10*3/uL (ref 0.7–4.0)
LYMPHS PCT: 7 % — AB (ref 12–46)
MCH: 28.7 pg (ref 26.0–34.0)
MCHC: 32.4 g/dL (ref 30.0–36.0)
MCV: 88.6 fL (ref 78.0–100.0)
MONO ABS: 0.8 10*3/uL (ref 0.1–1.0)
MONOS PCT: 6 % (ref 3–12)
NEUTROS ABS: 12.8 10*3/uL — AB (ref 1.7–7.7)
NEUTROS PCT: 87 % — AB (ref 43–77)
Platelets: 271 10*3/uL (ref 150–400)
RBC: 3.87 MIL/uL (ref 3.87–5.11)
RDW: 15.2 % (ref 11.5–15.5)
WBC: 14.7 10*3/uL — ABNORMAL HIGH (ref 4.0–10.5)

## 2013-10-22 LAB — BASIC METABOLIC PANEL
Anion gap: 15 (ref 5–15)
BUN: 34 mg/dL — AB (ref 6–23)
CO2: 20 meq/L (ref 19–32)
CREATININE: 0.98 mg/dL (ref 0.50–1.10)
Calcium: 7.6 mg/dL — ABNORMAL LOW (ref 8.4–10.5)
Chloride: 106 mEq/L (ref 96–112)
GFR calc non Af Amer: 53 mL/min — ABNORMAL LOW (ref >90)
GFR, EST AFRICAN AMERICAN: 61 mL/min — AB (ref >90)
GLUCOSE: 86 mg/dL (ref 70–99)
POTASSIUM: 4.3 meq/L (ref 3.7–5.3)
Sodium: 141 mEq/L (ref 137–147)

## 2013-10-22 MED ORDER — CALCIUM CARBONATE ANTACID 500 MG PO CHEW
1.0000 | CHEWABLE_TABLET | Freq: Once | ORAL | Status: AC
  Administered 2013-10-22: 200 mg via ORAL
  Filled 2013-10-22: qty 1

## 2013-10-22 NOTE — Discharge Summary (Signed)
Physician Discharge Summary  [name] KGY:[id number] DOB: [birth year] DOA: 10/20/2013  PCP: Gerrit Heck, MD  Admit date: 10/20/2013 Discharge date: 10/22/2013  Time spent: > 35  minutes  Recommendations for Outpatient Follow-up:  1. Please f/u with patient's cbc level. She had no fevers while in house 2. There is reported new RLL mass 3. Thoracentesis cytology reported no malignant cell present.  Discharge Diagnoses:  Please see list reported below.  Discharge Condition: stable  Diet recommendation: heart healthy  Filed Weights   10/21/13 0515 10/22/13 0520 10/22/13 0759  Weight: 41.141 kg (90 lb 11.2 oz) 41.6 kg (91 lb 11.4 oz) 42.321 kg (93 lb 4.8 oz)    History of present illness:  From original HPI: [name] is a 80 y.o. WF PMHx depressive disorder, combined systolic and diastolic CHF chronic, HTN, atrial fibrillation or flutter, Hx right breast cancer S./P. Mastectomy 1999 no XRT/Chemo, Hx skin cancer, Hx pleural effusion (seen by Dr. Christinia Gully [facility] pulmonology 11/22/2011) PAD. States shortness of breath started approximately 2 weeks ago and continued to worsen to the point that she felt she needed to call 911 today   Hospital Course:  Lung mass - Pt currently does not want to get biopsy or work up completed here in the hospital. She would prefer to do this as an outpatient. I have explainted that the cytology results did not show malignant cells. But that there still was a chance or there could be malignancy there. She verbalizes understanding and currently wishes to be discharged. I will place order for f/u with pulmonologist and I have recommended to patient to f/u with pulmonology after d/c  2. ACute on chronic systolic CHF - Will continue home regimen - Held ACE inhibitor secondary to soft blood pressures  3. COPD  -stable, nebs PRN  -do not suspect CAP/Exacerbation  -Patient had desaturation activity as such will discharge on home  O2 - Was recently given steroids as such I suspect this is the cause of elevated leukocytosis. No fevers within a 24-hour period  4. P.Afib  -rate controlled  -continue Metoprolol and amiodarone  -not on Anticoagulation, followed by Cards, continue ASA    Procedures:  None  Consultations:  None  Discharge Exam: Filed Vitals:   10/22/13 1354  BP: 103/46  Pulse: 72  Temp: 97.8 F (36.6 C)  Resp:     General: Pt in nad, alert and awake Cardiovascular: rrr, no mrg Respiratory: Decreased breath sounds over right lung base, otherwise no wheezes, breathing comfortably and speaking in full sentences  Discharge Instructions You were cared for by a hospitalist during your hospital stay. If you have any questions about your discharge medications or the care you received while you were in the hospital after you are discharged, you can call the unit and asked to speak with the hospitalist on call if the hospitalist that took care of you is not available. Once you are discharged, your primary care physician will handle any further medical issues. Please note that NO REFILLS for any discharge medications will be authorized once you are discharged, as it is imperative that you return to your primary care physician (or establish a relationship with a primary care physician if you do not have one) for your aftercare needs so that they can reassess your need for medications and monitor your lab values.  Discharge Instructions   Call MD for:  difficulty breathing, headache or visual disturbances    Complete by:  As directed  Call MD for:  redness, tenderness, or signs of infection (pain, swelling, redness, odor or green/yellow discharge around incision site)    Complete by:  As directed      Call MD for:  temperature >100.4    Complete by:  As directed      Diet - low sodium heart healthy    Complete by:  As directed      Discharge instructions    Complete by:  As directed   Please  discuss when to start taking your lisinopril with your cardiologist or pcp.  Also f/u with pulmonologist for further work up regarding new lung mass within 1-2 weeks after discharge.     For home use only DME oxygen    Complete by:  As directed   Mode or (Route):  Nasal cannula  Liters per Minute:  1  Frequency:  Continuous (stationary and portable oxygen unit needed)  Oxygen delivery system:  Gas     Increase activity slowly    Complete by:  As directed           Current Discharge Medication List    CONTINUE these medications which have NOT CHANGED   Details  albuterol (PROVENTIL HFA;VENTOLIN HFA) 108 (90 BASE) MCG/ACT inhaler Inhale 2 puffs into the lungs every 4 (four) hours as needed. Wheezing    amiodarone (PACERONE) 200 MG tablet Take 1 tablet (200 mg total) by mouth daily. Qty: 60 tablet, Refills: 5    Ascorbic Acid (VITAMIN C) 1000 MG tablet Take 1,000 mg by mouth daily.    aspirin EC 81 MG tablet Take 81 mg by mouth daily.    Calcium Carbonate-Vitamin D (CALCIUM 600 + D PO) Take 1 tablet by mouth daily.     cetirizine (ZYRTEC) 10 MG tablet Take 10 mg by mouth daily.    docusate sodium (COLACE) 100 MG capsule Take 300 mg by mouth daily.    furosemide (LASIX) 20 MG tablet Take 20 mg by mouth daily.    KRILL OIL PO Take 1 capsule by mouth daily.    metoprolol succinate (TOPROL-XL) 50 MG 24 hr tablet Take 50 mg by mouth daily. Take with or immediately following a meal.    Multiple Vitamins-Minerals (ICAPS) CAPS Take 1 capsule by mouth 2 (two) times daily.     omeprazole (PRILOSEC) 20 MG capsule Take 20 mg by mouth daily.    potassium chloride SA (K-DUR,KLOR-CON) 20 MEQ tablet Take 20 mEq by mouth daily.    silver sulfADIAZINE (SILVADENE) 1 % cream Apply 1 application topically daily. To left lower leg ulcer Qty: 50 g, Refills: 1      STOP taking these medications     lisinopril (PRINIVIL,ZESTRIL) 5 MG tablet        Allergies  Allergen Reactions  .  Ciprofloxacin     SOB  . Codeine Nausea Only  . Morphine And Related Nausea And Vomiting  . Penicillins Nausea And Vomiting  . Sodium Pentobarbital [Pentobarbital Sodium]       The results of significant diagnostics from this hospitalization (including imaging, microbiology, ancillary and laboratory) are listed below for reference.    Significant Diagnostic Studies: Dg Chest 1 View  10/20/2013   CLINICAL DATA:  Status post right thoracentesis  EXAM: CHEST - 1 VIEW  COMPARISON:  Radiograph 10/20/2013 at 158 hr, CT 10/21/2003  FINDINGS: There is decrease pleural fluid on the right. There is a mass at the right lung base measuring 4.5 cm which is noted on recent  CT. No appreciable pneumothorax. Moderate left effusion remains.  IMPRESSION: 1. Decrease in right pleural fluid and no pneumothorax following thoracentesis. 2. Persistent small to moderate left effusion. 3. Right lower lobe mass compares to recent CT.   Electronically Signed   By: Suzy Bouchard M.D.   On: 10/20/2013 10:35   Ct Angio Chest Pe W/cm &/or Wo Cm  10/20/2013   CLINICAL DATA:  Shortness of breath  EXAM: CT ANGIOGRAPHY CHEST WITH CONTRAST  TECHNIQUE: Multidetector CT imaging of the chest was performed using the standard protocol during bolus administration of intravenous contrast. Multiplanar CT image reconstructions and MIPs were obtained to evaluate the vascular anatomy.  CONTRAST:  57mL OMNIPAQUE IOHEXOL 350 MG/ML SOLN  COMPARISON:  Same day chest radiograph  FINDINGS: Degraded by respiratory motion. No pulmonary embolism identified. Some of the peripheral (distal segmental and subsegmental) branches are poorly evaluated given the motion.  Normal caliber aorta and branch vessels with scattered atherosclerotic disease.  Enlarged cardiac silhouette with left ventricular hypertrophy. Aortic valvular and coronary artery calcifications. Trace pericardial fluid.  Distal esophageal wall thickening. Moderate right and small left pleural  effusions. Indeterminate thyroid nodules.  Central airways are patent. Bilateral peribronchial thickening, lower lobe predominant. Cannot confirmed lower lobe bronchi patency.  Advanced emphysema.  No pneumothorax.  7 x 8 mm nodular opacity right lower lobe series 6, image 18 is nonspecific. Interlobular septal thickening. Hazy airspace opacities within the middle lobe, lingula, lung bases. More confluent lung base opacities.  Peripheral opacity right lower lobe measuring 4.6 x 3.7 cm is concerning for a mass (series 4, image 69).  There are prominent right hilar and subcarinal nodes measuring 1.7 and 1.9 cm short axis respectively.  Upper abdominal images show an atrophic left kidney.  Diffuse osteopenia. Multilevel compression fractures, including T2 and T9.  Review of the MIP images confirms the above findings.  IMPRESSION: No pulmonary embolism identified. Some of the peripheral most branches are poorly evaluated to respiratory motion.  Findings are worrisome for a right lower lobe mass measuring 4.6 cm.  Right hilar and subcarinal adenopathy, metastatic disease not excluded.  Moderate right and small left pleural effusions.  Pulmonary edema superimposed on advanced emphysema.  Lower lobe opacities may reflect atelectasis, aspiration, or infiltrate.  Cardiomegaly.   Electronically Signed   By: Carlos Levering M.D.   On: 10/20/2013 03:36   Dg Chest Port 1 View  10/20/2013   CLINICAL DATA:  Shortness of breath  EXAM: PORTABLE CHEST - 1 VIEW  COMPARISON:  03/05/2013  FINDINGS: Enlarged cardiac silhouette. Central vascular congestion. Aortic atherosclerosis. COPD. Superimposed interstitial and airspace opacities, most pronounced within the right lower lobe. Right greater left pleural effusions. Apical bullae. No large pneumothorax. Surgical clips right axilla. Diffuse osteopenia.  IMPRESSION: COPD. Superimposed interstitial and airspace opacities may reflect pulmonary edema and/or multifocal pneumonia.  Right  greater than left pleural effusions.   Electronically Signed   By: Carlos Levering M.D.   On: 10/20/2013 02:15   US Thoracentesis Asp Pleural Space W/img Guide  10/20/2013   CLINICAL DATA:  Patient with history of COPD, remote right breast carcinoma, right greater than left pleural effusions, dyspnea. Request is made for diagnostic right thoracentesis.  EXAM: ULTRASOUND GUIDED DIAGNOSTIC RIGHT THORACENTESIS  COMPARISON:  None.  PROCEDURE: An ultrasound guided thoracentesis was thoroughly discussed with the patient and questions answered. The benefits, risks, alternatives and complications were also discussed. The patient understands and wishes to proceed with the procedure. Written consent was obtained.  Ultrasound was performed to localize and mark an adequate pocket of fluid in the right chest. The area was then prepped and draped in the normal sterile fashion. 1% Lidocaine was used for local anesthesia. Under ultrasound guidance a 19 gauge Yueh catheter was introduced. Thoracentesis was performed. The catheter was removed and a dressing applied.  Complications:  None.  FINDINGS: A total of approximately 380 cc's of slightly turbid, amber fluid was removed. The fluid sample wassent for laboratory analysis. Due to the patient's hypotension only the above amount of fluid was removed at this time.  IMPRESSION: Successful ultrasound guided diagnostic right thoracentesis yielding 380 cc's of pleural fluid.  Read by: Rowe Robert, PA-C   Electronically Signed   By: Aletta Edouard M.D.   On: 10/20/2013 10:17    Microbiology: Recent Results (from the past 240 hour(s))  BODY FLUID CULTURE     Status: None   Collection Time    10/20/13 10:27 AM      Result Value Ref Range Status   Specimen Description PLEURAL FLUID RIGHT   Final   Special Requests 60ML FLUID   Final   Gram Stain     Final   Value: RARE WBC PRESENT, PREDOMINANTLY MONONUCLEAR     NO ORGANISMS SEEN     Performed at [facility]    Culture     Final   Value: NO GROWTH 2 DAYS     Performed at [facility]   Report Status PENDING   Incomplete  MRSA PCR SCREENING     Status: Abnormal   Collection Time    10/20/13  1:02 PM      Result Value Ref Range Status   MRSA by PCR POSITIVE (*) NEGATIVE Final   Comment:            The GeneXpert MRSA Assay (FDA     approved for NASAL specimens     only), is one component of a     comprehensive MRSA colonization     surveillance program. It is not     intended to diagnose MRSA     infection nor to guide or     monitor treatment for     MRSA infections.     RESULT CALLED TO, READ BACK BY AND VERIFIED WITH:     T. FAIRING RN 14:30 10/20/13 ([email or web address])     Labs: Basic Metabolic Panel:  Recent Labs Lab 10/20/13 0138 10/20/13 0144 10/20/13 4403 10/21/13 0622 10/22/13 0706  NA 138 137 142 139 141  K 6.4* 6.5* 4.4 4.2 4.3  CL 103 109 107 105 106  CO2 22  --  20 23 20   GLUCOSE 135* 140* 229* 129* 86  BUN 22 34* 23 28* 34*  CREATININE 0.95 1.20* 0.97 1.08 0.98  CALCIUM 8.0*  --  8.0* 7.6* 7.6*  MG  --   --  2.2  --   --    Liver Function Tests:  Recent Labs Lab 10/20/13 0633 10/21/13 0615  PROT  --  6.2  ALBUMIN 3.1*  --    No results found for this basename: LIPASE, AMYLASE,  in the last 168 hours No results found for this basename: AMMONIA,  in the last 168 hours CBC:  Recent Labs Lab 10/20/13 0138 10/20/13 0144 10/21/13 0622 10/22/13 0706  WBC 11.8*  --  15.7* 14.7*  NEUTROABS 8.5*  --  14.2* 12.8*  HGB 12.9 15.0 10.8* 11.1*  HCT 40.2 44.0 34.6* 34.3*  MCV  88.0  --  91.1 88.6  PLT 291  --  278 271   Cardiac Enzymes: No results found for this basename: CKTOTAL, CKMB, CKMBINDEX, TROPONINI,  in the last 168 hours BNP: BNP (last 3 results)  Recent Labs  03/05/13 0705 03/06/13 0226 10/20/13 0138  PROBNP 12653.0* 29707.0* 9906.0*   CBG: No results found for this basename: GLUCAP,  in the last 168 hours     Signed:  Velvet Bathe  [facility] 10/22/2013, 2:59 PM

## 2013-10-22 NOTE — Progress Notes (Signed)
Co-signed for Glean Hess, RN/BSN for medication administration, assessments, Vital signs, I's & O's, progress notes, care plans, and patient education. Tonny Branch, RN/BSN

## 2013-10-22 NOTE — Progress Notes (Signed)
Physical Therapy Treatment Patient Details Name: [name] MRN: [id number] DOB: 02/10/1930 Today's Date: 10/22/2013    History of Present Illness Pt is a 80 y.o. WF PMHx depressive disorder, combined systolic and diastolic CHF chronic, HTN, atrial fibrillation or flutter, Hx right breast cancer S./P. Mastectomy 1999 no XRT/Chemo, Hx skin cancer, Hx pleural effusion (seen by Dr. Christinia Gully [facility] pulmonology 11/22/2011) PAD. States shortness of breath started approximately 2 weeks ago and continued to worsen to the point that she felt she needed to call 911 today. Positive productive cough (white), positive N./V. negative fever States also has lost 30 pounds in the last 18 months.    PT Comments    Pt progressing well towards physical therapy goals. Functionally, pt does not require assistance for transfers or ambulation, however HHA provided during ambulation as she usually has a cane.   SATURATION QUALIFICATIONS: (This note is used to comply with regulatory documentation for home oxygen)  Patient Saturations on Room Air at Rest = 96%  Patient Saturations on Room Air while Ambulating = 78%  Please briefly explain why patient needs home oxygen: At rest, sats remain ~90% while sitting EOB. During mobility and ambulation sats signficantly drop, even though pt is not symptomatic.   Follow Up Recommendations  Home health PT;Supervision - Intermittent     Equipment Recommendations  None recommended by PT    Recommendations for Other Services       Precautions / Restrictions Precautions Precautions: Fall Restrictions Weight Bearing Restrictions: No    Mobility  Bed Mobility               General bed mobility comments: Pt received sitting on BSC  Transfers Overall transfer level: Modified independent Equipment used: None             General transfer comment: Pt able to power up to full standing position without physical assist. Reqired time to gait balance  at EOB but no LOB and again, no assist required.   Ambulation/Gait Ambulation/Gait assistance: Min guard Ambulation Distance (Feet): 200 Feet Assistive device: 1 person hand held assist Gait Pattern/deviations: Step-through pattern;Decreased stride length;Trunk flexed;Narrow base of support Gait velocity: Decreased Gait velocity interpretation: Below normal speed for age/gender General Gait Details: Pt ambulated on RA and did not report any SOB or lightheadedness. Pt did not appear to have labored breathing. Upon return to the room O2 sats at 78%. With return of supplemental O2 sats improved to >90%.    Stairs            Wheelchair Mobility    Modified Rankin (Stroke Patients Only)       Balance Overall balance assessment: Needs assistance Sitting-balance support: Feet supported;No upper extremity supported Sitting balance-Leahy Scale: Fair     Standing balance support: No upper extremity supported Standing balance-Leahy Scale: Fair                      Cognition Arousal/Alertness: Awake/alert Behavior During Therapy: WFL for tasks assessed/performed Overall Cognitive Status: Within Functional Limits for tasks assessed                      Exercises General Exercises - Lower Extremity Ankle Circles/Pumps: 15 reps Long Arc Quad: 10 reps    General Comments        Pertinent Vitals/Pain Pain Assessment: No/denies pain    Home Living  Prior Function            PT Goals (current goals can now be found in the care plan section) Acute Rehab PT Goals Patient Stated Goal: To return to PLOF PT Goal Formulation: With patient Time For Goal Achievement: 10/28/13 Potential to Achieve Goals: Good Progress towards PT goals: Progressing toward goals    Frequency  Min 3X/week    PT Plan Current plan remains appropriate    Co-evaluation             End of Session Equipment Utilized During Treatment: Gait  belt Activity Tolerance: Patient tolerated treatment well Patient left: in chair;with call bell/phone within reach;with chair alarm set     Time: 4315-4008 PT Time Calculation (min): 27 min  Charges:  $Gait Training: 8-22 mins $Therapeutic Activity: 8-22 mins                    G Codes:      Jolyn Lent 2013/11/09, 2:17 PM  Jolyn Lent, PT, DPT Acute Rehabilitation Services Pager: [phone]

## 2013-10-22 NOTE — Progress Notes (Signed)
DC IV, DC tele, DC home. Discharge instructions and home medications discussed with patient. Pt denied any questions or concerns at this time. Pt leaving unit via wheelchair and appears in no acute distress.

## 2013-10-23 ENCOUNTER — Ambulatory Visit: Payer: Self-pay | Admitting: Internal Medicine

## 2013-10-23 NOTE — Progress Notes (Signed)
.  SATURATION QUALIFICATIONS: (This note is used to comply with regulatory documentation for home oxygen)  Patient Saturations on Room Air at Rest = 90%  Patient Saturations on Room Air while Ambulating = 78%  Patient Saturations on 2 Liters of oxygen while Ambulating = 92%   

## 2013-10-24 LAB — BODY FLUID CULTURE: CULTURE: NO GROWTH

## 2013-10-26 ENCOUNTER — Encounter (HOSPITAL_COMMUNITY): Payer: Self-pay | Admitting: Emergency Medicine

## 2013-10-26 ENCOUNTER — Emergency Department (HOSPITAL_COMMUNITY): Payer: Medicare Other

## 2013-10-26 ENCOUNTER — Inpatient Hospital Stay (HOSPITAL_COMMUNITY)
Admission: EM | Admit: 2013-10-26 | Discharge: 2013-10-29 | DRG: 291 | Disposition: A | Discharge status: Home Health | Payer: Medicare Other | Attending: Internal Medicine | Admitting: Internal Medicine

## 2013-10-26 ENCOUNTER — Telehealth: Payer: Self-pay | Admitting: Physician Assistant

## 2013-10-26 DIAGNOSIS — J9620 Acute and chronic respiratory failure, unspecified whether with hypoxia or hypercapnia: Secondary | ICD-10-CM | POA: Diagnosis not present

## 2013-10-26 DIAGNOSIS — H3530 Unspecified macular degeneration: Secondary | ICD-10-CM | POA: Diagnosis present

## 2013-10-26 DIAGNOSIS — Z8249 Family history of ischemic heart disease and other diseases of the circulatory system: Secondary | ICD-10-CM

## 2013-10-26 DIAGNOSIS — Z96649 Presence of unspecified artificial hip joint: Secondary | ICD-10-CM | POA: Diagnosis not present

## 2013-10-26 DIAGNOSIS — M810 Age-related osteoporosis without current pathological fracture: Secondary | ICD-10-CM | POA: Diagnosis present

## 2013-10-26 DIAGNOSIS — Z7982 Long term (current) use of aspirin: Secondary | ICD-10-CM | POA: Diagnosis not present

## 2013-10-26 DIAGNOSIS — Z888 Allergy status to other drugs, medicaments and biological substances status: Secondary | ICD-10-CM

## 2013-10-26 DIAGNOSIS — Z9010 Acquired absence of unspecified breast and nipple: Secondary | ICD-10-CM

## 2013-10-26 DIAGNOSIS — Z79899 Other long term (current) drug therapy: Secondary | ICD-10-CM

## 2013-10-26 DIAGNOSIS — I4891 Unspecified atrial fibrillation: Secondary | ICD-10-CM | POA: Diagnosis present

## 2013-10-26 DIAGNOSIS — R627 Adult failure to thrive: Secondary | ICD-10-CM | POA: Diagnosis present

## 2013-10-26 DIAGNOSIS — Z825 Family history of asthma and other chronic lower respiratory diseases: Secondary | ICD-10-CM

## 2013-10-26 DIAGNOSIS — Z853 Personal history of malignant neoplasm of breast: Secondary | ICD-10-CM | POA: Diagnosis not present

## 2013-10-26 DIAGNOSIS — Z85828 Personal history of other malignant neoplasm of skin: Secondary | ICD-10-CM | POA: Diagnosis not present

## 2013-10-26 DIAGNOSIS — J9621 Acute and chronic respiratory failure with hypoxia: Secondary | ICD-10-CM | POA: Diagnosis present

## 2013-10-26 DIAGNOSIS — J90 Pleural effusion, not elsewhere classified: Secondary | ICD-10-CM | POA: Diagnosis present

## 2013-10-26 DIAGNOSIS — Z880 Allergy status to penicillin: Secondary | ICD-10-CM

## 2013-10-26 DIAGNOSIS — I509 Heart failure, unspecified: Secondary | ICD-10-CM

## 2013-10-26 DIAGNOSIS — E43 Unspecified severe protein-calorie malnutrition: Secondary | ICD-10-CM

## 2013-10-26 DIAGNOSIS — Z881 Allergy status to other antibiotic agents status: Secondary | ICD-10-CM | POA: Diagnosis not present

## 2013-10-26 DIAGNOSIS — Z885 Allergy status to narcotic agent status: Secondary | ICD-10-CM | POA: Diagnosis not present

## 2013-10-26 DIAGNOSIS — R222 Localized swelling, mass and lump, trunk: Secondary | ICD-10-CM | POA: Diagnosis present

## 2013-10-26 DIAGNOSIS — J9601 Acute respiratory failure with hypoxia: Secondary | ICD-10-CM

## 2013-10-26 DIAGNOSIS — J439 Emphysema, unspecified: Secondary | ICD-10-CM | POA: Diagnosis present

## 2013-10-26 DIAGNOSIS — R0609 Other forms of dyspnea: Secondary | ICD-10-CM | POA: Diagnosis present

## 2013-10-26 DIAGNOSIS — Z66 Do not resuscitate: Secondary | ICD-10-CM | POA: Diagnosis present

## 2013-10-26 DIAGNOSIS — J438 Other emphysema: Secondary | ICD-10-CM | POA: Diagnosis present

## 2013-10-26 DIAGNOSIS — Z602 Problems related to living alone: Secondary | ICD-10-CM

## 2013-10-26 DIAGNOSIS — I5023 Acute on chronic systolic (congestive) heart failure: Secondary | ICD-10-CM | POA: Diagnosis not present

## 2013-10-26 DIAGNOSIS — I959 Hypotension, unspecified: Secondary | ICD-10-CM | POA: Diagnosis present

## 2013-10-26 DIAGNOSIS — I10 Essential (primary) hypertension: Secondary | ICD-10-CM | POA: Diagnosis present

## 2013-10-26 DIAGNOSIS — Z87891 Personal history of nicotine dependence: Secondary | ICD-10-CM | POA: Diagnosis not present

## 2013-10-26 DIAGNOSIS — R918 Other nonspecific abnormal finding of lung field: Secondary | ICD-10-CM | POA: Diagnosis present

## 2013-10-26 LAB — I-STAT CHEM 8, ED
BUN: 36 mg/dL — AB (ref 6–23)
CALCIUM ION: 1.03 mmol/L — AB (ref 1.13–1.30)
CHLORIDE: 110 meq/L (ref 96–112)
Creatinine, Ser: 1.2 mg/dL — ABNORMAL HIGH (ref 0.50–1.10)
Glucose, Bld: 112 mg/dL — ABNORMAL HIGH (ref 70–99)
HCT: 52 % — ABNORMAL HIGH (ref 36.0–46.0)
Hemoglobin: 17.7 g/dL — ABNORMAL HIGH (ref 12.0–15.0)
Potassium: 5.2 mEq/L (ref 3.7–5.3)
Sodium: 139 mEq/L (ref 137–147)
TCO2: 23 mmol/L (ref 0–100)

## 2013-10-26 LAB — BASIC METABOLIC PANEL
Anion gap: 16 — ABNORMAL HIGH (ref 5–15)
BUN: 31 mg/dL — AB (ref 6–23)
CHLORIDE: 105 meq/L (ref 96–112)
CO2: 20 mEq/L (ref 19–32)
Calcium: 8.2 mg/dL — ABNORMAL LOW (ref 8.4–10.5)
Creatinine, Ser: 1.06 mg/dL (ref 0.50–1.10)
GFR calc Af Amer: 56 mL/min — ABNORMAL LOW (ref >90)
GFR calc non Af Amer: 48 mL/min — ABNORMAL LOW (ref >90)
GLUCOSE: 106 mg/dL — AB (ref 70–99)
Potassium: 5.6 mEq/L — ABNORMAL HIGH (ref 3.7–5.3)
Sodium: 141 mEq/L (ref 137–147)

## 2013-10-26 LAB — CBC
HEMATOCRIT: 41.1 % (ref 36.0–46.0)
HEMOGLOBIN: 13 g/dL (ref 12.0–15.0)
MCH: 27.8 pg (ref 26.0–34.0)
MCHC: 31.6 g/dL (ref 30.0–36.0)
MCV: 87.8 fL (ref 78.0–100.0)
Platelets: 380 10*3/uL (ref 150–400)
RBC: 4.68 MIL/uL (ref 3.87–5.11)
RDW: 15 % (ref 11.5–15.5)
WBC: 12.6 10*3/uL — ABNORMAL HIGH (ref 4.0–10.5)

## 2013-10-26 LAB — PRO B NATRIURETIC PEPTIDE: Pro B Natriuretic peptide (BNP): 25631 pg/mL — ABNORMAL HIGH (ref 0–450)

## 2013-10-26 LAB — I-STAT TROPONIN, ED: Troponin i, poc: 0.01 ng/mL (ref 0.00–0.08)

## 2013-10-26 MED ORDER — DOCUSATE SODIUM 100 MG PO CAPS
100.0000 mg | ORAL_CAPSULE | Freq: Two times a day (BID) | ORAL | Status: DC
  Administered 2013-10-26 – 2013-10-29 (×7): 100 mg via ORAL
  Filled 2013-10-26 (×8): qty 1

## 2013-10-26 MED ORDER — OCUVITE-LUTEIN PO CAPS
1.0000 | ORAL_CAPSULE | Freq: Every day | ORAL | Status: DC
  Administered 2013-10-26 – 2013-10-29 (×4): 1 via ORAL
  Filled 2013-10-26 (×5): qty 1

## 2013-10-26 MED ORDER — LISINOPRIL 5 MG PO TABS
5.0000 mg | ORAL_TABLET | Freq: Every day | ORAL | Status: DC
  Administered 2013-10-26: 5 mg via ORAL
  Filled 2013-10-26 (×4): qty 1

## 2013-10-26 MED ORDER — PANTOPRAZOLE SODIUM 40 MG PO TBEC
40.0000 mg | DELAYED_RELEASE_TABLET | Freq: Every day | ORAL | Status: DC
  Administered 2013-10-26 – 2013-10-29 (×4): 40 mg via ORAL
  Filled 2013-10-26 (×4): qty 1

## 2013-10-26 MED ORDER — FUROSEMIDE 10 MG/ML IJ SOLN
40.0000 mg | Freq: Once | INTRAMUSCULAR | Status: AC
  Administered 2013-10-26: 40 mg via INTRAVENOUS
  Filled 2013-10-26: qty 4

## 2013-10-26 MED ORDER — POTASSIUM CHLORIDE CRYS ER 20 MEQ PO TBCR
20.0000 meq | EXTENDED_RELEASE_TABLET | Freq: Every day | ORAL | Status: DC
  Administered 2013-10-26: 20 meq via ORAL
  Filled 2013-10-26: qty 1

## 2013-10-26 MED ORDER — POTASSIUM CHLORIDE CRYS ER 20 MEQ PO TBCR: 20.0000 meq | EXTENDED_RELEASE_TABLET | Freq: Every day | ORAL | Status: DC

## 2013-10-26 MED ORDER — ALBUTEROL SULFATE HFA 108 (90 BASE) MCG/ACT IN AERS: 2.0000 | INHALATION_SPRAY | RESPIRATORY_TRACT | Status: DC | PRN

## 2013-10-26 MED ORDER — AMIODARONE HCL 200 MG PO TABS: 200.0000 mg | ORAL_TABLET | Freq: Every day | ORAL | Status: DC

## 2013-10-26 MED ORDER — ENOXAPARIN SODIUM 30 MG/0.3ML ~~LOC~~ SOLN
30.0000 mg | SUBCUTANEOUS | Status: DC
  Administered 2013-10-26 – 2013-10-28 (×3): 30 mg via SUBCUTANEOUS
  Filled 2013-10-26 (×4): qty 0.3

## 2013-10-26 MED ORDER — AMIODARONE HCL 200 MG PO TABS
200.0000 mg | ORAL_TABLET | Freq: Every day | ORAL | Status: DC
  Administered 2013-10-26 – 2013-10-29 (×4): 200 mg via ORAL
  Filled 2013-10-26 (×4): qty 1

## 2013-10-26 MED ORDER — ASPIRIN EC 81 MG PO TBEC
81.0000 mg | DELAYED_RELEASE_TABLET | Freq: Every day | ORAL | Status: DC
  Administered 2013-10-26 – 2013-10-29 (×4): 81 mg via ORAL
  Filled 2013-10-26 (×4): qty 1

## 2013-10-26 MED ORDER — METOPROLOL SUCCINATE ER 50 MG PO TB24
50.0000 mg | ORAL_TABLET | Freq: Every day | ORAL | Status: DC
  Filled 2013-10-26 (×4): qty 1

## 2013-10-26 MED ORDER — ONDANSETRON HCL 4 MG PO TABS
4.0000 mg | ORAL_TABLET | Freq: Four times a day (QID) | ORAL | Status: DC | PRN
  Administered 2013-10-27: 4 mg via ORAL
  Filled 2013-10-26: qty 1

## 2013-10-26 MED ORDER — FUROSEMIDE 10 MG/ML IJ SOLN
40.0000 mg | Freq: Two times a day (BID) | INTRAMUSCULAR | Status: AC
  Administered 2013-10-26 – 2013-10-27 (×3): 40 mg via INTRAVENOUS
  Filled 2013-10-26 (×4): qty 4

## 2013-10-26 MED ORDER — ACETAMINOPHEN 650 MG RE SUPP: 650.0000 mg | Freq: Four times a day (QID) | RECTAL | Status: DC | PRN

## 2013-10-26 MED ORDER — NITROGLYCERIN 2 % TD OINT
1.0000 [in_us] | TOPICAL_OINTMENT | Freq: Once | TRANSDERMAL | Status: AC
  Administered 2013-10-26: 1 [in_us] via TOPICAL
  Filled 2013-10-26: qty 1

## 2013-10-26 MED ORDER — METOPROLOL SUCCINATE ER 50 MG PO TB24: 50.0000 mg | ORAL_TABLET | Freq: Every day | ORAL | Status: DC

## 2013-10-26 MED ORDER — TRAMADOL HCL 50 MG PO TABS: 50.0000 mg | ORAL_TABLET | Freq: Four times a day (QID) | ORAL | Status: DC | PRN

## 2013-10-26 MED ORDER — LISINOPRIL 5 MG PO TABS
5.0000 mg | ORAL_TABLET | Freq: Every day | ORAL | Status: DC
Start: 2013-10-27 — End: 2013-10-26

## 2013-10-26 MED ORDER — MAGNESIUM HYDROXIDE 400 MG/5ML PO SUSP: 30.0000 mL | Freq: Every day | ORAL | Status: DC | PRN

## 2013-10-26 MED ORDER — ALUM & MAG HYDROXIDE-SIMETH 200-200-20 MG/5ML PO SUSP: 30.0000 mL | Freq: Four times a day (QID) | ORAL | Status: DC | PRN

## 2013-10-26 MED ORDER — ACETAMINOPHEN 325 MG PO TABS: 650.0000 mg | ORAL_TABLET | Freq: Four times a day (QID) | ORAL | Status: DC | PRN

## 2013-10-26 MED ORDER — ONDANSETRON HCL 4 MG/2ML IJ SOLN: 4.0000 mg | Freq: Four times a day (QID) | INTRAMUSCULAR | Status: DC | PRN

## 2013-10-26 MED ORDER — ALBUTEROL SULFATE (2.5 MG/3ML) 0.083% IN NEBU: 2.5000 mg | INHALATION_SOLUTION | RESPIRATORY_TRACT | Status: DC | PRN

## 2013-10-26 NOTE — ED Notes (Signed)
[place] neighbor, [phone] home.

## 2013-10-26 NOTE — Telephone Encounter (Signed)
Pt called Sunday answering service with worsening SOB and chest tightness. Albuterol not helping. Frail, elderly, h/o AF, CHF, and recent 4.6cm RLL mass on CT. She has to stop in the middle of sentences to take a breath then resume talking. I advised she hang up the phone and call 911. She verbalized understanding of advice and plans to come to ER. Dayna Dunn PA-C
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
## 2013-10-26 NOTE — ED Notes (Signed)
Pt reports sob that started last night with non productive cough and chest tightness. spo2 89% at triage.
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
## 2013-10-27 DIAGNOSIS — I517 Cardiomegaly: Secondary | ICD-10-CM

## 2013-10-27 LAB — BASIC METABOLIC PANEL
Anion gap: 11 (ref 5–15)
BUN: 33 mg/dL — AB (ref 6–23)
CALCIUM: 7.6 mg/dL — AB (ref 8.4–10.5)
CO2: 23 meq/L (ref 19–32)
Chloride: 108 mEq/L (ref 96–112)
Creatinine, Ser: 1.29 mg/dL — ABNORMAL HIGH (ref 0.50–1.10)
GFR calc Af Amer: 44 mL/min — ABNORMAL LOW (ref >90)
GFR, EST NON AFRICAN AMERICAN: 38 mL/min — AB (ref >90)
Glucose, Bld: 90 mg/dL (ref 70–99)
Potassium: 4.6 mEq/L (ref 3.7–5.3)
SODIUM: 142 meq/L (ref 137–147)

## 2013-10-27 MED ORDER — BOOST / RESOURCE BREEZE PO LIQD
1.0000 | Freq: Three times a day (TID) | ORAL | Status: DC
  Administered 2013-10-27 – 2013-10-29 (×7): 1 via ORAL

## 2013-10-27 MED ORDER — SODIUM CHLORIDE 0.9 % IV BOLUS (SEPSIS)
250.0000 mL | Freq: Once | INTRAVENOUS | Status: AC
  Administered 2013-10-27: 250 mL via INTRAVENOUS
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
## 2013-10-27 NOTE — Progress Notes (Signed)
[facility] PROGRESS NOTE  [name] W[facility]:[id number] DOB: 06-06-1929 DOA: 10/26/2013 PCP: Gerrit Heck, MD  Assessment/Plan:  Principal Problem:   Acute on chronic systolic CHF (congestive heart failure), NYHA class 4 - continue lisinopril and metoprolol if true blood pressures permit - Lasix 40 mg IV BID on board.  Active Problems:   COPD (chronic obstructive pulmonary disease) - No wheezes - stable, continue albuterol prn    Right lower lobe lung mass - Consider IR consult for biopsy with improvement in condition.  Hypotension - Again clinically patient doesn't appear to have such profound hypotension as she is conversant, alert, smiling at times, moving extremities. My suspicion is inaccurate blood pressure read secondary to ill fitting blood pressure cuff. I have recommended we obtain a smaller blood pressure cuff for more accurate readings and have discussed this with nursing.  Code Status: DNR Family Communication: None at bedside Disposition Plan: Telemetry monitoring. Repeat blood pressure with an appropriate cuff size for patient. If hypotension is true (doubtfull given clinical picture) then would transfer to stepdown   Consultants:  None  Procedures:  None  Antibiotics:  None  HPI/Subjective: Pt has no new complaints.  States that she has had problems obtaining an accurate blood pressure cuff secondary to her petite body frame. She states that her cardiologist has required a pediatric cuff to obtain better blood pressure readings.  Objective: Filed Vitals:   10/27/13 1227  BP: 79/62  Pulse:   Temp:   Resp:     Intake/Output Summary (Last 24 hours) at 10/27/13 1303 Last data filed at 10/27/13 1250  Gross per 24 hour  Intake    900 ml  Output   2275 ml  Net  -1375 ml   Filed Weights   10/26/13 1315 10/27/13 0543  Weight: 41.232 kg (90 lb 14.4 oz) 39.7 kg (87 lb 8.4 oz)    Exam:   General:  Pt in nad, alert and  awake  Cardiovascular: rrr, no mrg  Respiratory: cta bl, no wheezes, diminished breath sounds at bases  Abdomen: soft, NT, ND  Musculoskeletal: no cyanosis or clubbing   Data Reviewed: Basic Metabolic Panel:  Recent Labs Lab 10/21/13 0622 10/22/13 0706 10/26/13 1015 10/27/13 0242  NA 139 141 139 142  K 4.2 4.3 5.2 4.6  CL 105 106 110 108  CO2 23 20  --  23  GLUCOSE 129* 86 112* 90  BUN 28* 34* 36* 33*  CREATININE 1.08 0.98 1.20* 1.29*  CALCIUM 7.6* 7.6*  --  7.6*   Liver Function Tests:  Recent Labs Lab 10/21/13 0615  PROT 6.2   No results found for this basename: LIPASE, AMYLASE,  in the last 168 hours No results found for this basename: AMMONIA,  in the last 168 hours CBC:  Recent Labs Lab 10/21/13 0622 10/22/13 0706 10/26/13 0824 10/26/13 1015  WBC 15.7* 14.7* 12.6*  --   NEUTROABS 14.2* 12.8*  --   --   HGB 10.8* 11.1* 13.0 17.7*  HCT 34.6* 34.3* 41.1 52.0*  MCV 91.1 88.6 87.8  --   PLT 278 271 380  --    Cardiac Enzymes: No results found for this basename: CKTOTAL, CKMB, CKMBINDEX, TROPONINI,  in the last 168 hours BNP (last 3 results)  Recent Labs  03/06/13 0226 10/20/13 0138 10/26/13 0935  PROBNP 29707.0* 9906.0* 25631.0*   CBG: No results found for this basename: GLUCAP,  in the last 168 hours  Recent Results (from the past 240 hour(s))  BODY FLUID CULTURE     Status: None   Collection Time    10/20/13 10:27 AM      Result Value Ref Range Status   Specimen Description PLEURAL FLUID RIGHT   Final   Special Requests 60ML FLUID   Final   Gram Stain     Final   Value: RARE WBC PRESENT, PREDOMINANTLY MONONUCLEAR     NO ORGANISMS SEEN     Performed at [facility]   Culture     Final   Value: NO GROWTH 4 DAYS     Performed at [facility]   Report Status 10/24/2013 FINAL   Final  MRSA PCR SCREENING     Status: Abnormal   Collection Time    10/20/13  1:02 PM      Result Value Ref Range Status   MRSA by PCR POSITIVE  (*) NEGATIVE Final   Comment:            The GeneXpert MRSA Assay (FDA     approved for NASAL specimens     only), is one component of a     comprehensive MRSA colonization     surveillance program. It is not     intended to diagnose MRSA     infection nor to guide or     monitor treatment for     MRSA infections.     RESULT CALLED TO, READ BACK BY AND VERIFIED WITH:     T. FAIRING RN 14:30 10/20/13 ([email or web address])     Studies: Dg Chest Port 1 View  10/26/2013   CLINICAL DATA:  Sudden onset of shortness of breath this morning. History of CHF and COPD  EXAM: PORTABLE CHEST - 1 VIEW  COMPARISON:  10/20/2013; 03/05/2013; chest CT - 10/20/2013  FINDINGS: Grossly unchanged cardiac silhouette and mediastinal contours with atherosclerotic plaque within the thoracic aorta. Pulmonary vasculature appears indistinct with cephalization of flow. Interval increase in size of small bilateral effusions, now with partial obscuration of known mass within the right lower lung. Worsening bibasilar heterogeneous opacities. No pneumothorax. Unchanged bones.  IMPRESSION: 1. Findings worrisome for progressive pulmonary edema superimposed on advanced emphysematous change with interval increase in small bilateral effusions and associated bibasilar opacities, atelectasis versus infiltrate. 2. Partial obscuration of known right lower lobe mass again worrisome for bronchogenic carcinoma.   Electronically Signed   By: Sandi Mariscal M.D.   On: 10/26/2013 10:09    Scheduled Meds: . amiodarone  200 mg Oral Daily  . aspirin EC  81 mg Oral Daily  . docusate sodium  100 mg Oral BID  . enoxaparin (LOVENOX) injection  30 mg Subcutaneous Q24H  . feeding supplement (RESOURCE BREEZE)  1 Container Oral TID BM  . furosemide  40 mg Intravenous BID  . lisinopril  5 mg Oral Daily  . metoprolol succinate  50 mg Oral Daily  . multivitamin-lutein  1 capsule Oral Daily  . pantoprazole  40 mg Oral Daily   Continuous Infusions:    Time spent:  > 35 minutes    Velvet Bathe  [facility] Pager [phone] If 7PM-7AM, please contact night-coverage at [email or web address], password [email or web address] 10/27/2013, 1:03 PM  LOS: 1 day
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
## 2013-10-27 NOTE — Progress Notes (Signed)
Patient's blood pressure 71/36 after 224ml NS bolus.  Dr. Wendee Beavers made aware.  Will continue to monitor.

## 2013-10-27 NOTE — Progress Notes (Signed)
Echo Lab  2D Echocardiogram completed.  Cutten, RDCS 10/27/2013 9:44 AM

## 2013-10-27 NOTE — Progress Notes (Signed)
Patient's blood pressure 77/35 sitting (double checked).  Patient asymptomatic.  Dr. Wendee Beavers made aware.  New orders received.  Will continue to monitor.

## 2013-10-27 NOTE — Progress Notes (Signed)
PT Cancellation Note  Patient Details Name: [name] MRN: [id number] DOB: Aug 07, 1929   Cancelled Treatment:    Reason Eval/Treat Not Completed: Patient not medically ready. Per RN, pt currently with hypotension with systolic BP in 84'C. Will hold for the morning and check back this afternoon as schedule allows.    Jolyn Lent 10/27/2013, 11:51 AM  Jolyn Lent, PT, DPT Acute Rehabilitation Services Pager: [phone]

## 2013-10-27 NOTE — Progress Notes (Signed)
Called about low blood pressure. Patient is alert and awake. Smiling at times and moving extremities without any difficulty.  I suspect that documented low blood pressures are inaccurate as blood pressure cuff used is too large for pateint. Discussed with nursing about finding a smaller and maybe even a pediatric cuff for blood pressure evaluation.  [name], [employer]

## 2013-10-27 NOTE — Progress Notes (Signed)
Utilization Review Completed.[name] T9/08/2013  

## 2013-10-27 NOTE — Evaluation (Signed)
Physical Therapy Evaluation Patient Details Name: [name] MRN: [id number] DOB: 1929-05-22 Today's Date: 10/27/2013   History of Present Illness  Pt is a 80 y.o. WF PMHx depressive disorder, combined systolic and diastolic CHF chronic, HTN, atrial fibrillation or flutter, Hx right breast cancer S./P. Mastectomy 1999 no XRT/Chemo, Hx skin cancer, Hx pleural effusion (seen by Dr. Christinia Gully [facility] pulmonology 11/22/2011) PAD.  States also has lost 30 pounds in the last 18 months. Recently admitted and discharged on 10/23/13 for CHF, and when returned home had increasing DOE. 911 was called for return to the ED.  Clinical Impression  Pt admitted with the above. Pt currently with functional limitations due to the deficits listed below (see PT Problem List). At the time of PT eval pt was able to ambulate safely with HHA and steadying assist for 200'. Pt will benefit from skilled PT to increase their independence and safety with mobility to allow discharge to the venue listed below. Pt reports that HHPT was not set up for her after d/c on 10/23/13. Pt will need HHPT services if returning home at d/c.      Follow Up Recommendations Home health PT;Supervision - Intermittent    Equipment Recommendations  None recommended by PT    Recommendations for Other Services       Precautions / Restrictions Precautions Precautions: Fall Restrictions Weight Bearing Restrictions: No      Mobility  Bed Mobility               General bed mobility comments: Pt received sitting on EOB with nursing staff  Transfers Overall transfer level: Needs assistance Equipment used: 1 person hand held assist Transfers: Sit to/from Stand Sit to Stand: Supervision         General transfer comment: Supervision for safety. No physical assist required.   Ambulation/Gait Ambulation/Gait assistance: Min assist Ambulation Distance (Feet): 200 Feet Assistive device: 1 person hand held assist Gait  Pattern/deviations: Step-through pattern;Decreased stride length;Trunk flexed Gait velocity: Decreased Gait velocity interpretation: Below normal speed for age/gender General Gait Details: Steadying assist and HHA required. Pt did not report any DOE and did not appear to be in labored breathing. Upon return to the room O2 sats were at 89% on room air with seated rest break.   Stairs            Wheelchair Mobility    Modified Rankin (Stroke Patients Only)       Balance Overall balance assessment: Needs assistance Sitting-balance support: Feet supported;No upper extremity supported Sitting balance-Leahy Scale: Fair     Standing balance support: No upper extremity supported;During functional activity Standing balance-Leahy Scale: Fair                               Pertinent Vitals/Pain Pain Assessment: No/denies pain    Home Living Family/patient expects to be discharged to:: Private residence Living Arrangements: Alone Available Help at Discharge: Personal care attendant Type of Home: House Home Access: Stairs to enter Entrance Stairs-Rails: Right;Left;Can reach both Entrance Stairs-Number of Steps: 3 Home Layout: One level Home Equipment: Cane - single point;Walker - 2 wheels;Toilet riser      Prior Function Level of Independence: Independent with assistive device(s)         Comments: States she can do all of her ADL's herself on the days that the aides come. Just slowly. 2 falls in the past year.      Hand  Dominance   Dominant Hand: Right    Extremity/Trunk Assessment   Upper Extremity Assessment: Defer to OT evaluation           Lower Extremity Assessment: Generalized weakness      Cervical / Trunk Assessment: Normal  Communication   Communication: No difficulties  Cognition Arousal/Alertness: Awake/alert Behavior During Therapy: WFL for tasks assessed/performed Overall Cognitive Status: Within Functional Limits for tasks  assessed                      General Comments      Exercises        Assessment/Plan    PT Assessment Patient needs continued PT services  PT Diagnosis Difficulty walking;Generalized weakness   PT Problem List Decreased strength;Decreased range of motion;Decreased activity tolerance;Decreased balance;Decreased mobility;Decreased knowledge of use of DME;Decreased safety awareness;Decreased knowledge of precautions  PT Treatment Interventions DME instruction;Gait training;Stair training;Functional mobility training;Therapeutic activities;Therapeutic exercise;Neuromuscular re-education;Patient/family education   PT Goals (Current goals can be found in the Care Plan section) Acute Rehab PT Goals Patient Stated Goal: To return to PLOF PT Goal Formulation: With patient Time For Goal Achievement: 10/28/13 Potential to Achieve Goals: Good    Frequency Min 3X/week   Barriers to discharge Decreased caregiver support Hired aides available but no 24 hour assist.     Co-evaluation               End of Session Equipment Utilized During Treatment: Gait belt Activity Tolerance: Patient tolerated treatment well Patient left: in bed;with call bell/phone within reach;with bed alarm set (Seated EOB eating supper. RN notified. ) Nurse Communication: Mobility status         Time: 4008-6761 PT Time Calculation (min): 17 min   Charges:   PT Evaluation $Initial PT Evaluation Tier I: 1 Procedure PT Treatments $Gait Training: 8-22 mins   PT G Codes:          Jolyn Lent 10/27/2013, 5:54 PM  Jolyn Lent, PT, DPT Acute Rehabilitation Services Pager: [phone]

## 2013-10-28 ENCOUNTER — Ambulatory Visit: Payer: Self-pay | Admitting: Internal Medicine

## 2013-10-28 DIAGNOSIS — J9621 Acute and chronic respiratory failure with hypoxia: Secondary | ICD-10-CM | POA: Diagnosis present

## 2013-10-28 DIAGNOSIS — J9620 Acute and chronic respiratory failure, unspecified whether with hypoxia or hypercapnia: Secondary | ICD-10-CM

## 2013-10-28 DIAGNOSIS — J90 Pleural effusion, not elsewhere classified: Secondary | ICD-10-CM

## 2013-10-28 DIAGNOSIS — J438 Other emphysema: Secondary | ICD-10-CM

## 2013-10-28 LAB — BASIC METABOLIC PANEL
Anion gap: 12 (ref 5–15)
BUN: 33 mg/dL — AB (ref 6–23)
CO2: 26 mEq/L (ref 19–32)
Calcium: 7.6 mg/dL — ABNORMAL LOW (ref 8.4–10.5)
Chloride: 104 mEq/L (ref 96–112)
Creatinine, Ser: 1.11 mg/dL — ABNORMAL HIGH (ref 0.50–1.10)
GFR calc Af Amer: 53 mL/min — ABNORMAL LOW (ref >90)
GFR, EST NON AFRICAN AMERICAN: 45 mL/min — AB (ref >90)
GLUCOSE: 88 mg/dL (ref 70–99)
POTASSIUM: 4.1 meq/L (ref 3.7–5.3)
Sodium: 142 mEq/L (ref 137–147)

## 2013-10-28 NOTE — Progress Notes (Signed)
Physical Therapy Treatment Patient Details Name: [name] MRN: [id number] DOB: 02-Aug-1929 Today's Date: 10/28/2013    History of Present Illness Pt is a 80 y.o. WF PMHx depressive disorder, combined systolic and diastolic CHF chronic, HTN, atrial fibrillation or flutter, Hx right breast cancer S./P. Mastectomy 1999 no XRT/Chemo, Hx skin cancer, Hx pleural effusion (seen by Dr. Christinia Gully [facility] pulmonology 11/22/2011) PAD.  States also has lost 30 pounds in the last 18 months. Recently admitted and discharged on 10/23/13 for CHF, and when returned home had increasing DOE. 911 was called for return to the ED.    PT Comments    Pt progressing towards physical therapy goals. Focus of today's session was strengthening, utilizing both supine and standing exercise for balance as well. Pt fatigued at end of session but otherwise tolerated well. Will continue to follow and progress as able per POC.   Follow Up Recommendations  Home health PT;Supervision - Intermittent     Equipment Recommendations  None recommended by PT    Recommendations for Other Services       Precautions / Restrictions Precautions Precautions: Fall Restrictions Weight Bearing Restrictions: No    Mobility  Bed Mobility Overal bed mobility: Modified Independent             General bed mobility comments: No physical assist required. HOB slightly elevated.   Transfers Overall transfer level: Needs assistance Equipment used: None Transfers: Sit to/from Stand Sit to Stand: Min assist         General transfer comment: Supervision for safety. No physical assist required. x10 sit<>stand during session as an exercise - occasional posterior balance disturbance in which min assist was required to recover.   Ambulation/Gait             General Gait Details: Deferred as focus of session was strengthening   Stairs            Wheelchair Mobility    Modified Rankin (Stroke Patients Only)        Balance Overall balance assessment: Needs assistance Sitting-balance support: Feet supported;No upper extremity supported Sitting balance-Leahy Scale: Fair     Standing balance support: No upper extremity supported Standing balance-Leahy Scale: Fair                      Cognition Arousal/Alertness: Awake/alert Behavior During Therapy: WFL for tasks assessed/performed Overall Cognitive Status: Within Functional Limits for tasks assessed                      Exercises General Exercises - Lower Extremity Ankle Circles/Pumps: 20 reps Quad Sets: 10 reps Long Arc Quad: 10 reps Hip ABduction/ADduction: 10 reps;Standing;Supine (x10 each) Straight Leg Raises: 10 reps;Supine;Standing (x10 each) Mini-Sqauts: 10 reps    General Comments        Pertinent Vitals/Pain Pain Assessment: No/denies pain    Home Living                      Prior Function            PT Goals (current goals can now be found in the care plan section) Acute Rehab PT Goals Patient Stated Goal: To return to PLOF PT Goal Formulation: With patient Time For Goal Achievement: 10/28/13 Potential to Achieve Goals: Good Progress towards PT goals: Progressing toward goals    Frequency  Min 3X/week    PT Plan Current plan remains appropriate    Co-evaluation  End of Session Equipment Utilized During Treatment: Gait belt Activity Tolerance: Patient tolerated treatment well Patient left: in bed;with call bell/phone within reach;with bed alarm set     Time: 1610-9604 PT Time Calculation (min): 23 min  Charges:  $Therapeutic Exercise: 8-22 mins $Therapeutic Activity: 8-22 mins                    G Codes:      Jolyn Lent November 22, 2013, 11:02 AM  Jolyn Lent, PT, DPT Acute Rehabilitation Services Pager: [phone]

## 2013-10-28 NOTE — Clinical Documentation Improvement (Signed)
H&P and progress notes state acute on chronic systolic CHF.  Echo on 10/27/2013 also notes grade 2 diastolic dysfunction. Non-Hospital problem list has chronic combined systolic and diastolic CHF.   Please clarify CHF type and acuity and document in your progress note and discharge summary.     Possible Clinical Conditions: -Acute on Chronic Systolic & Diastolic Congestive Heart Failure -Acute on chronic systolic heart failure with chronic diastolic heart failure -Other Condition -Unable to determine  Thank you, Mateo Flow, RN [phone] Clinical Documentation Specialist

## 2013-10-28 NOTE — Progress Notes (Signed)
1054 - BP 84/47. HR 77.   1056 - BP 76/52.  HR 81. 1058 - BP 98/61.  HR 77. Lisinopril 5mg  PO and Metoprolo 50mg  PO were held. Notified physician via [facility] page.

## 2013-10-28 NOTE — Progress Notes (Signed)
Reported to PM nurse; pt sitting on side of bed, in no acute distress.

## 2013-10-28 NOTE — Care Management Note (Addendum)
  Page 2 of 2   10/29/2013     12:09:22 PM CARE MANAGEMENT NOTE 10/29/2013  Patient:  ALIAH, ERIKSSON   Account Number:  [id number]  Date Initiated:  10/28/2013  Documentation initiated by:  Mariann Laster  Subjective/Objective Assessment:   CHF     Action/Plan:   CM to follow for disposition needs   Anticipated DC Date:  10/28/2013   Anticipated DC Plan:  [place]  CM consult      [facility] Choice  HOME HEALTH   Choice offered to / List presented to:  C-1 Patient        [place] arranged  HH-1 RN  [place]      [place].   Status of service:  Completed, signed off Medicare Important Message given?  YES (If response is "NO", the following Medicare IM given date fields will be blank) Date Medicare IM given:  10/28/2013 Medicare IM given by:  Melitza Metheny Date Additional Medicare IM given:   Additional Medicare IM given by:    Discharge Disposition:  [place]  Per UR Regulation:  Reviewed for med. necessity/level of care/duration of stay  If discussed at [place] of Stay Meetings, dates discussed:    Comments:  Miles Borkowski RN, BSN, MSHL, CCM  Nurse - Case Manager,  (Unit [place])  [phone]  10/29/2013 CM provided member with resource:  [employer] Whe[employer] ([employer])- patient confirms she is already active with [employer] Friend/[place] plans to pick up at d/c. RA SATS 91% and ok to transport home with no portable oxygen tank.   Ingri Diemer RN, BSN, MSHL, CCM  Nurse - Case Manager,  (Unit [phone]  10/28/2013 Social:  From home alone.  Patients husband currently in a SNF d/t CVA.  Neighbor asssit PRN but is not a primary care provider.  Astranged DTR d/t Patient's refusal to seek Psych care and take medication mgmt.  Patient has tried Zoloft with c/o s/e: [place].  Patient has a pet; DTR caring for pet. 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 ([facility] notifie)

## 2013-10-28 NOTE — Consult Note (Addendum)
HPI:  35 F readmitted 9/06 with rest and exertional dyspnea. She has PMH of COPD and CHF. Denies angina, pleurodynia, purulent sputum, hemoptysis. Her initial eval included the CT chest demonstrating a RLL lung mass. I am asked to consider further eval.   PMH: COPD CHF Breast Ca - S/P R mastectomy Htn PAF  SH: Former heavy smoker. Quit 16 yrs ago. Lives alone semi-independently  FH: [place]  ROS: + PND Occasional nocturia + wt loss, now with some   Filed Vitals:   10/28/13 1054 10/28/13 1056 10/28/13 1058 10/28/13 2014  BP: 84/47 76/52 98/61  119/73  Pulse: 77 81 77 79  Temp:    98 F (36.7 C)  TempSrc:    Oral  Resp:    18  Height:      Weight:      SpO2:    99%   Frail, pleasant, no overt distress Cognition intact HEENT WNL No JVD noted. No LAN Diminished BS, bibasilar crackles, absent BS in R base, no wheezes NABS, soft Ext without edema  I have reviewed all of today's lab results. Relevant abnormalities are discussed in the A/P section  CT chest: severe emphysema, edema pattern, R>L effusions, RLL pleural based lung mass  Principal Problem:   Acute on chronic respiratory failure with hypoxemia - suspect acute component is mostly decompensated CHF Active Problems:   Acute on chronic systolic CHF (congestive heart failure), NYHA class 4   Failure to thrive in adult   Pleural effusions - likely due to HF   Emphysema/COPD - appears severe by CT chest. Only on PRN albuterol   Right lower lobe lung mass - appearance worrisome for malignancy, likely bronchogenic  Poor candidate for any specific therapy    CHF (congestive heart failure)   Frequent paroxysmal nocturnal dyspnea  REC: No diagnostic study presently Assess for home O2 - she might benefit significantly from this, especially with sleep  Trial of long acting bronchodilator therapy - rec either Advair or Spiriva Maximize services in home - likely would benefit from home health RN Follow up CT chest in 8 wks  - should be a LIMITED study to reassess RLL mass and DOES NOT REQUIRE IV CONTRAST If mass is stable, leave alone If mass is increasing in size or is causing pleuritic pain, there is more compelling reason to pursue dx  CT guided TTNA would have best risk vs diagnostic yield profile   Discussed with Dr Wendee Beavers and with Dr Bebe Liter, MD ; [facility] service Mobile [phone].  After 5:30 PM or weekends, call [phone]

## 2013-10-28 NOTE — Progress Notes (Signed)
[facility] PROGRESS NOTE  [name] FTD:[id number] DOB: April 02, 1929 DOA: 10/26/2013 PCP: Gerrit Heck, MD Brief Narrative: 80 y/o history of chronic systolic congestive heart failure, COPD, new right lower lobe lung mass, recent pleural effusion on last admission requiring thoracentesis (no malignant cells found on cytology). Who presented with acute CHF exacerbation. On this admission requesting biopsy of Right lung mass.  Patient has had recorded low blood pressures but blood pressure cuffs use have been too big for patients body habitus. Accurate readings have been reported with pediatric cuff size  Assessment/Plan:  Principal Problem:   Acute on chronic systolic CHF (congestive heart failure), NYHA class 4 - continue lisinopril and metoprolol if true blood pressures permit - While in house patient has received Lasix and currently reports much improvement. Will hold Lasix given softer blood pressures. Again prior hypotensive values most likely due to improper cuff size  Active Problems:   COPD (chronic obstructive pulmonary disease) - No wheezes - stable, continue albuterol prn    Right lower lobe lung mass - IR consulted for biopsy who recommended Pulmonology consult. I have consulted Pulmonology as well.  Hypotension - Again clinically patient doesn't appear to have such profound hypotension as she is conversant, alert, smiling at times, moving extremities. My suspicion is inaccurate blood pressure read secondary to ill fitting blood pressure cuff. I have recommended we obtain a smaller blood pressure cuff for more accurate readings and have discussed this with nursing. -Nursing reports with pediatric blood pressure cuff to have gotten better blood pressure results.  Code Status: DNR Family Communication: None at bedside Disposition Plan: Telemetry monitoring.    Consultants:  None  Procedures:  None  Antibiotics:  None  HPI/Subjective: Pt  has no new complaints.  States that she has had problems obtaining an accurate blood pressure cuff secondary to her petite body frame. She states that her cardiologist has required a pediatric cuff to obtain better blood pressure readings. Patient states she has not had any more episodes of worsening shortness of breath  Objective: Filed Vitals:   10/28/13 1058  BP: 98/61  Pulse: 77  Temp:   Resp:     Intake/Output Summary (Last 24 hours) at 10/28/13 1403 Last data filed at 10/28/13 1350  Gross per 24 hour  Intake   1160 ml  Output   1575 ml  Net   -415 ml   Filed Weights   10/26/13 1315 10/27/13 0543 10/28/13 0520  Weight: 41.232 kg (90 lb 14.4 oz) 39.7 kg (87 lb 8.4 oz) 40.8 kg (89 lb 15.2 oz)    Exam:   General:  Pt in nad, alert and awake  Cardiovascular: rrr, no mrg  Respiratory: cta bl, no wheezes, diminished breath sounds at bases  Abdomen: soft, NT, ND  Musculoskeletal: no cyanosis or clubbing   Data Reviewed: Basic Metabolic Panel:  Recent Labs Lab 10/22/13 0706 10/26/13 1015 10/27/13 0242 10/28/13 0446  NA 141 139 142 142  K 4.3 5.2 4.6 4.1  CL 106 110 108 104  CO2 20  --  23 26  GLUCOSE 86 112* 90 88  BUN 34* 36* 33* 33*  CREATININE 0.98 1.20* 1.29* 1.11*  CALCIUM 7.6*  --  7.6* 7.6*   Liver Function Tests: No results found for this basename: AST, ALT, ALKPHOS, BILITOT, PROT, ALBUMIN,  in the last 168 hours No results found for this basename: LIPASE, AMYLASE,  in the last 168 hours No results found for this basename: AMMONIA,  in the  last 168 hours CBC:  Recent Labs Lab 10/22/13 0706 10/26/13 0824 10/26/13 1015  WBC 14.7* 12.6*  --   NEUTROABS 12.8*  --   --   HGB 11.1* 13.0 17.7*  HCT 34.3* 41.1 52.0*  MCV 88.6 87.8  --   PLT 271 380  --    Cardiac Enzymes: No results found for this basename: CKTOTAL, CKMB, CKMBINDEX, TROPONINI,  in the last 168 hours BNP (last 3 results)  Recent Labs  03/06/13 0226 10/20/13 0138  10/26/13 0935  PROBNP 29707.0* 9906.0* 25631.0*   CBG: No results found for this basename: GLUCAP,  in the last 168 hours  Recent Results (from the past 240 hour(s))  BODY FLUID CULTURE     Status: None   Collection Time    10/20/13 10:27 AM      Result Value Ref Range Status   Specimen Description PLEURAL FLUID RIGHT   Final   Special Requests 60ML FLUID   Final   Gram Stain     Final   Value: RARE WBC PRESENT, PREDOMINANTLY MONONUCLEAR     NO ORGANISMS SEEN     Performed at [facility]   Culture     Final   Value: NO GROWTH 4 DAYS     Performed at [facility]   Report Status 10/24/2013 FINAL   Final  MRSA PCR SCREENING     Status: Abnormal   Collection Time    10/20/13  1:02 PM      Result Value Ref Range Status   MRSA by PCR POSITIVE (*) NEGATIVE Final   Comment:            The GeneXpert MRSA Assay (FDA     approved for NASAL specimens     only), is one component of a     comprehensive MRSA colonization     surveillance program. It is not     intended to diagnose MRSA     infection nor to guide or     monitor treatment for     MRSA infections.     RESULT CALLED TO, READ BACK BY AND VERIFIED WITH:     T. FAIRING RN 14:30 10/20/13 ([email or web address])     Studies: No results found.  Scheduled Meds: . amiodarone  200 mg Oral Daily  . aspirin EC  81 mg Oral Daily  . docusate sodium  100 mg Oral BID  . enoxaparin (LOVENOX) injection  30 mg Subcutaneous Q24H  . feeding supplement (RESOURCE BREEZE)  1 Container Oral TID BM  . lisinopril  5 mg Oral Daily  . metoprolol succinate  50 mg Oral Daily  . multivitamin-lutein  1 capsule Oral Daily  . pantoprazole  40 mg Oral Daily   Continuous Infusions:    Time spent: > 35 minutes    Velvet Bathe  [facility] Pager [phone] If 7PM-7AM, please contact night-coverage at [email or web address], password [facility] 10/28/2013, 2:03 PM  LOS: 2 days

## 2013-10-28 NOTE — Progress Notes (Signed)
Co-signed for Glean Hess, RN/BSN for medication administration, assessments, Vital signs, I's & O's, progress notes, care plans, and patient education. Tonny Branch, RN/BSN

## 2013-10-28 NOTE — Clinical Documentation Improvement (Signed)
Dietician note states BMI 15.51 (Ht 5'3"/ wt 90lbs.) and notes severe malnutrition in the context of chronic illness; underweight.  Please document in your progress note and discharge summary if you agree with RD assessment of severe malnutrition / underweight.   Thank you, Mateo Flow, RN [phone] Clinical Documentation Specialist

## 2013-10-28 NOTE — Plan of Care (Signed)
Problem: Phase I Progression Outcomes Goal: EF % per last Echo/documented,Core Reminder form on chart Outcome: Completed/Met Date Met:  10/28/13 Ef 25-30%

## 2013-10-28 NOTE — Progress Notes (Signed)
Patient ID: [name], female   DOB: 10-11-1929, 80 y.o.   MRN: [id number]   Request received for Rt lung mass biopsy Films reviewed by Dr Jacqulynn Cadet  Feels pt will need Pulm consult before bx Also Rec: PET CT                  Poss Bronchoscopy                  Poss thoracentesis after Pulm consult.  All can be performed as OP  Message given to Dr Wendee Beavers

## 2013-10-29 DIAGNOSIS — R627 Adult failure to thrive: Secondary | ICD-10-CM

## 2013-10-29 DIAGNOSIS — E43 Unspecified severe protein-calorie malnutrition: Secondary | ICD-10-CM

## 2013-10-29 DIAGNOSIS — J9600 Acute respiratory failure, unspecified whether with hypoxia or hypercapnia: Secondary | ICD-10-CM

## 2013-10-29 LAB — BASIC METABOLIC PANEL
Anion gap: 11 (ref 5–15)
BUN: 34 mg/dL — AB (ref 6–23)
CHLORIDE: 106 meq/L (ref 96–112)
CO2: 24 meq/L (ref 19–32)
Calcium: 8.1 mg/dL — ABNORMAL LOW (ref 8.4–10.5)
Creatinine, Ser: 0.98 mg/dL (ref 0.50–1.10)
GFR calc Af Amer: 61 mL/min — ABNORMAL LOW (ref >90)
GFR, EST NON AFRICAN AMERICAN: 53 mL/min — AB (ref >90)
GLUCOSE: 83 mg/dL (ref 70–99)
POTASSIUM: 4.1 meq/L (ref 3.7–5.3)
SODIUM: 141 meq/L (ref 137–147)

## 2013-10-29 MED ORDER — BOOST / RESOURCE BREEZE PO LIQD: 1.0000 | Freq: Three times a day (TID) | ORAL | Status: AC

## 2013-10-29 NOTE — Progress Notes (Signed)
Patient evaluated for community based chronic disease management services with [place] [facility] as a benefit of patient's [employer]. Spoke with patient at bedside to explain [facility] services. Services accepted with written consent.  Patient will receive a post discharge transition of care call and will be evaluated for monthly home visits for assessments and CHF disease process education.  Patient also wants assistance with ALF placement.  Being discharged with [facility] RN, PT, and SW.  Comfort [facility] will provide some personal care support.  [employer] will engage their telephonic support services as well.  Left contact information and [facility] literature at bedside. Made Inpatient Case Manager aware that [place] [facility] following. Of note, [facility] Management services does not replace or interfere with any services that are arranged by inpatient case management or social work.  For additional questions or referrals please contact Corliss Blacker BSN RN [facility] Liaison at [phone].

## 2013-10-29 NOTE — Progress Notes (Signed)
Physical Therapy Treatment Patient Details Name: [name] MRN: [id number] DOB: 03-Dec-1929 Today's Date: 10/29/2013    History of Present Illness Pt is a 80 y.o. WF PMHx depressive disorder, combined systolic and diastolic CHF chronic, HTN, atrial fibrillation or flutter, Hx right breast cancer S./P. Mastectomy 1999 no XRT/Chemo, Hx skin cancer, Hx pleural effusion (seen by Dr. Christinia Gully [facility] pulmonology 11/22/2011) PAD.  States also has lost 30 pounds in the last 18 months. Recently admitted and discharged on 10/23/13 for CHF, and when returned home had increasing DOE. Pt admitted with acute on chronic respiratory failure with hypoxemia    PT Comments    Pt to discharge home today however very agreeable to ambulate again before leaving and review exercises.  Pt educated to use RW if up at home alone for safety and also reviewed and educated on HEP until [place] arrives.  Pt very pleasant and cooperative.  Pt had no further questions/concerns.   Follow Up Recommendations  Home health PT;Supervision - Intermittent     Equipment Recommendations  None recommended by PT    Recommendations for Other Services       Precautions / Restrictions Precautions Precautions: Fall    Mobility  Bed Mobility Overal bed mobility: Modified Independent             General bed mobility comments: increased time and effort for LEs  Transfers Overall transfer level: Needs assistance Equipment used: 1 person hand held assist Transfers: Sit to/from Stand Sit to Stand: Supervision         General transfer comment: Supervision for safety. No physical assist required.   Ambulation/Gait Ambulation/Gait assistance: Min guard Ambulation Distance (Feet): 160 Feet Assistive device: 1 person hand held assist Gait Pattern/deviations: Step-through pattern;Decreased stride length;Wide base of support Gait velocity: Decreased   General Gait Details: pt reports she usually ambulates with cane  or HHA at home with another person so provided 1 HHA, discussed using RW for bil UE for safety if up at home alone, slightly DOE however SpO2 93% room air   Stairs            Wheelchair Mobility    Modified Rankin (Stroke Patients Only)       Balance                                    Cognition Arousal/Alertness: Awake/alert Behavior During Therapy: WFL for tasks assessed/performed Overall Cognitive Status: Within Functional Limits for tasks assessed                      Exercises General Exercises - Lower Extremity Long Arc Quad: AROM;Both;10 reps;Seated Heel Slides: AROM;Both;10 reps Hip ABduction/ADduction: AROM;Both;10 reps;Supine Straight Leg Raises: AROM;Supine;Both;10 reps Hip Flexion/Marching: AROM;Seated;Both;10 reps    General Comments        Pertinent Vitals/Pain Pain Assessment: No/denies pain    Home Living                      Prior Function            PT Goals (current goals can now be found in the care plan section) Progress towards PT goals: Progressing toward goals    Frequency  Min 3X/week    PT Plan Current plan remains appropriate    Co-evaluation             End of Session  Activity Tolerance: Patient tolerated treatment well Patient left: with call bell/phone within reach;in bed     Time: 1326-1349 PT Time Calculation (min): 23 min  Charges:  $Gait Training: 8-22 mins $Therapeutic Exercise: 8-22 mins                    G Codes:      Nocholas Damaso,KATHrine E November 16, 2013, 2:02 PM Carmelia Bake, PT, DPT 16-Nov-2013 Pager: [phone]

## 2013-10-29 NOTE — Progress Notes (Signed)
Patient given discharge instructions and prescription slip. IV taken out and patient sent down to friend in wheelchair by [facility]. All questions answered and patient stable to go home.

## 2013-10-29 NOTE — Telephone Encounter (Signed)
In hospital. 

## 2013-10-29 NOTE — Discharge Summary (Signed)
Physician Discharge Summary  [name] NKN:[id number] DOB: 07/25/1929 DOA: 10/26/2013  PCP: Gerrit Heck, MD  Admit date: 10/26/2013 Discharge date: 10/29/2013  Time spent: 35 minutes  Recommendations for Outpatient Follow-up:  1. PATIENT WILL NEED A REPEAT CT SCAN OF LUNGS IN 8 WEEKS. PULMONARY MEDICINE RECOMMENDING THAT IF MASS IS STABLE NO FURTHER WORKUP AND IF MASS IS INCREASING IN SIZE FURTHER WORK UP WITH BIOPSY.  2. Follow up on blood pressures, her lisinopril and metoprolol were stopped on discharged due to hypotension.  3. Prior to discharge home health services were set up for her to receive PT, RN and Aide  Discharge Diagnoses:  Principal Problem:   Acute on chronic respiratory failure with hypoxemia Active Problems:   Acute on chronic systolic CHF (congestive heart failure), NYHA class 4   Failure to thrive in adult   Pleural effusion   Emphysema/COPD   Right lower lobe lung mass   Discharge Condition: Stable Improved  Diet recommendation: Heart Healthy  Filed Weights   10/27/13 0543 10/28/13 0520 10/29/13 0625  Weight: 39.7 kg (87 lb 8.4 oz) 40.8 kg (89 lb 15.2 oz) 40 kg (88 lb 2.9 oz)    History of present illness:  [name] is a 80 y.o. female  With h/o chronic systolic heart failure, last EF in 1/15 20-25%, lung mass, copd, PAF, breast cancer just discharged from hospital on 10/22/13, presents with worsening dyspnea. During last hospitalization, treated for COPD exacerbation, diagnosed with right lung mass, had nondiagnostic thoracentesis. Her lisinopril was held at discharge due to borderline hypotension, but patient seems to have continued it at discharge. She wanted to workup the lung mass further as outpatient and has appointment with pulmonary medicine next week. Denies chest pain or palpitations. In ED, pro BNP is 25,000 (9000 last admission), CXR showing CHF. Patient denies having missed any medications. Denies increase in edema.  Cardiologist is Dr. Marlou Porch.    Hospital Course:  Patient is a pleasant 80 year old female with a past medical history of chronic systolic and diastolic congestive heart failure, chronic obstructive pulmonary disease, recent diagnosis of right lower lobe mass who was admitted to the medicine service on 10/26/2013. She presented to the emergency room with complaints of warts of breath. Symptoms felt to be secondary to acute decompensated congestive heart failure and she was started on IV diuresis. Transthoracic echocardiogram performed 10/27/2013 showing ejection fraction of 25-30% with moderate hypokinesis, grade 2 diastolic dysfunction. Patient showed gradual clinical improvement. During this hospitalization her blood pressures were low, with systolic blood pressures in the 80s on 10/28/2013. Because of hypotension lisinopril and metoprolol were held on discharge. Other issues addressed during this hospitalization include right lung mass. Patient having a CT scan on 10/20/2013 which showed right lower lobe mass measuring 4.6 cm. She was seen and evaluated by pulmonary medicine during this hospitalization who recommended a followup CT in 8 weeks. If mass is stable no further workup was indicated however if mass was increased in size pulmonary medicine recommending proceeding with biopsy. She did have a recent thoracentesis as pathology did not show presence of malignant cells. Prior to discharge home health services person for her to receive home physical therapy, RN and nursing aide.   Consultations:  Pulmonary critical care medicine  Physical therapy  Discharge Exam: Filed Vitals:   10/29/13 1021  BP: 102/63  Pulse: 87  Temp:   Resp: 16    General: Patient was ambulated down the hallway, she is awake alert oriented. She  does not require supplemental oxygen, reports feeling better Cardiovascular: Regular rate and rhythm normal S1-S2 Respiratory: She has a few bibasilar crackles, normal  inspiratory effort, breathing comfortably on room air Abdomen: Soft nontender, nondistended  Discharge Instructions You were cared for by a hospitalist during your hospital stay. If you have any questions about your discharge medications or the care you received while you were in the hospital after you are discharged, you can call the unit and asked to speak with the hospitalist on call if the hospitalist that took care of you is not available. Once you are discharged, your primary care physician will handle any further medical issues. Please note that NO REFILLS for any discharge medications will be authorized once you are discharged, as it is imperative that you return to your primary care physician (or establish a relationship with a primary care physician if you do not have one) for your aftercare needs so that they can reassess your need for medications and monitor your lab values.  Discharge Instructions   Call MD for:  difficulty breathing, headache or visual disturbances    Complete by:  As directed      Call MD for:  extreme fatigue    Complete by:  As directed      Call MD for:  hives    Complete by:  As directed      Call MD for:  persistant dizziness or light-headedness    Complete by:  As directed      Call MD for:  persistant nausea and vomiting    Complete by:  As directed      Call MD for:  redness, tenderness, or signs of infection (pain, swelling, redness, odor or green/yellow discharge around incision site)    Complete by:  As directed      Call MD for:  severe uncontrolled pain    Complete by:  As directed      Call MD for:  temperature >100.4    Complete by:  As directed      Diet - low sodium heart healthy    Complete by:  As directed      Increase activity slowly    Complete by:  As directed           Current Discharge Medication List    START taking these medications   Details  feeding supplement, RESOURCE BREEZE, (RESOURCE BREEZE) LIQD Take 1 Container by  mouth 3 (three) times daily between meals. Qty: 30 Container, Refills: 0      CONTINUE these medications which have NOT CHANGED   Details  albuterol (PROVENTIL HFA;VENTOLIN HFA) 108 (90 BASE) MCG/ACT inhaler Inhale 2 puffs into the lungs every 4 (four) hours as needed. Wheezing    amiodarone (PACERONE) 200 MG tablet Take 1 tablet (200 mg total) by mouth daily. Qty: 60 tablet, Refills: 5    Ascorbic Acid (VITAMIN C) 1000 MG tablet Take 1,000 mg by mouth daily.    aspirin EC 81 MG tablet Take 81 mg by mouth daily.    Calcium Carbonate-Vitamin D (CALCIUM 600 + D PO) Take 1 tablet by mouth daily.     cetirizine (ZYRTEC) 10 MG tablet Take 10 mg by mouth daily.    docusate sodium (COLACE) 100 MG capsule Take 300 mg by mouth daily.    furosemide (LASIX) 20 MG tablet Take 20 mg by mouth daily.    Multiple Vitamins-Minerals (ICAPS) CAPS Take 1 capsule by mouth daily.     omeprazole (  PRILOSEC) 20 MG capsule Take 20 mg by mouth daily.    Polyvinyl Alcohol-Povidone (REFRESH OP) Place 1 drop into both eyes daily as needed (dry eye).    potassium chloride SA (K-DUR,KLOR-CON) 20 MEQ tablet Take 20 mEq by mouth daily.    silver sulfADIAZINE (SILVADENE) 1 % cream Apply 1 application topically daily. To left lower leg ulcer Qty: 50 g, Refills: 1    traMADol (ULTRAM) 50 MG tablet Take 50 mg by mouth every 6 (six) hours as needed for moderate pain.       STOP taking these medications     KRILL OIL PO      lisinopril (PRINIVIL,ZESTRIL) 5 MG tablet      metoprolol succinate (TOPROL-XL) 50 MG 24 hr tablet        Allergies  Allergen Reactions  . Ciprofloxacin     SOB  . Codeine Nausea Only  . Morphine And Related Nausea And Vomiting  . Penicillins Nausea And Vomiting  . Sodium Pentobarbital [Pentobarbital Sodium]    Follow-up Information   Follow up with [facility]. (Registered Nurse and Physical Therapy Services to start within 24-48 hours of discharge)     Contact information:   [address] [place] [address] [phone]       Please follow up. (Resume Personal Care Services arrangements with [facility] )    Contact information:   [facility]      Follow up with Gerrit Heck, MD In 1 week.   Specialty:  Family Medicine   Contact information:   [place] [address] [phone]       Follow up with Merton Border, MD In 4 weeks.   Specialty:  Pulmonary Disease   Contact information:   [place] [address] [phone]        The results of significant diagnostics from this hospitalization (including imaging, microbiology, ancillary and laboratory) are listed below for reference.    Significant Diagnostic Studies: Dg Chest 1 View  10/20/2013   CLINICAL DATA:  Status post right thoracentesis  EXAM: CHEST - 1 VIEW  COMPARISON:  Radiograph 10/20/2013 at 158 hr, CT 10/21/2003  FINDINGS: There is decrease pleural fluid on the right. There is a mass at the right lung base measuring 4.5 cm which is noted on recent CT. No appreciable pneumothorax. Moderate left effusion remains.  IMPRESSION: 1. Decrease in right pleural fluid and no pneumothorax following thoracentesis. 2. Persistent small to moderate left effusion. 3. Right lower lobe mass compares to recent CT.   Electronically Signed   By: Suzy Bouchard M.D.   On: 10/20/2013 10:35   Ct Angio Chest Pe W/cm &/or Wo Cm  10/20/2013   CLINICAL DATA:  Shortness of breath  EXAM: CT ANGIOGRAPHY CHEST WITH CONTRAST  TECHNIQUE: Multidetector CT imaging of the chest was performed using the standard protocol during bolus administration of intravenous contrast. Multiplanar CT image reconstructions and MIPs were obtained to evaluate the vascular anatomy.  CONTRAST:  62mL OMNIPAQUE IOHEXOL 350 MG/ML SOLN  COMPARISON:  Same day chest radiograph  FINDINGS: Degraded by respiratory motion. No pulmonary embolism identified. Some of the  peripheral (distal segmental and subsegmental) branches are poorly evaluated given the motion.  Normal caliber aorta and branch vessels with scattered atherosclerotic disease.  Enlarged cardiac silhouette with left ventricular hypertrophy. Aortic valvular and coronary artery calcifications. Trace pericardial fluid.  Distal esophageal wall thickening. Moderate right and small left pleural effusions. Indeterminate thyroid  nodules.  Central airways are patent. Bilateral peribronchial thickening, lower lobe predominant. Cannot confirmed lower lobe bronchi patency.  Advanced emphysema.  No pneumothorax.  7 x 8 mm nodular opacity right lower lobe series 6, image 18 is nonspecific. Interlobular septal thickening. Hazy airspace opacities within the middle lobe, lingula, lung bases. More confluent lung base opacities.  Peripheral opacity right lower lobe measuring 4.6 x 3.7 cm is concerning for a mass (series 4, image 69).  There are prominent right hilar and subcarinal nodes measuring 1.7 and 1.9 cm short axis respectively.  Upper abdominal images show an atrophic left kidney.  Diffuse osteopenia. Multilevel compression fractures, including T2 and T9.  Review of the MIP images confirms the above findings.  IMPRESSION: No pulmonary embolism identified. Some of the peripheral most branches are poorly evaluated to respiratory motion.  Findings are worrisome for a right lower lobe mass measuring 4.6 cm.  Right hilar and subcarinal adenopathy, metastatic disease not excluded.  Moderate right and small left pleural effusions.  Pulmonary edema superimposed on advanced emphysema.  Lower lobe opacities may reflect atelectasis, aspiration, or infiltrate.  Cardiomegaly.   Electronically Signed   By: Carlos Levering M.D.   On: 10/20/2013 03:36   Dg Chest Port 1 View  10/26/2013   CLINICAL DATA:  Sudden onset of shortness of breath this morning. History of CHF and COPD  EXAM: PORTABLE CHEST - 1 VIEW  COMPARISON:  10/20/2013;  03/05/2013; chest CT - 10/20/2013  FINDINGS: Grossly unchanged cardiac silhouette and mediastinal contours with atherosclerotic plaque within the thoracic aorta. Pulmonary vasculature appears indistinct with cephalization of flow. Interval increase in size of small bilateral effusions, now with partial obscuration of known mass within the right lower lung. Worsening bibasilar heterogeneous opacities. No pneumothorax. Unchanged bones.  IMPRESSION: 1. Findings worrisome for progressive pulmonary edema superimposed on advanced emphysematous change with interval increase in small bilateral effusions and associated bibasilar opacities, atelectasis versus infiltrate. 2. Partial obscuration of known right lower lobe mass again worrisome for bronchogenic carcinoma.   Electronically Signed   By: Sandi Mariscal M.D.   On: 10/26/2013 10:09   Dg Chest Port 1 View  10/20/2013   CLINICAL DATA:  Shortness of breath  EXAM: PORTABLE CHEST - 1 VIEW  COMPARISON:  03/05/2013  FINDINGS: Enlarged cardiac silhouette. Central vascular congestion. Aortic atherosclerosis. COPD. Superimposed interstitial and airspace opacities, most pronounced within the right lower lobe. Right greater left pleural effusions. Apical bullae. No large pneumothorax. Surgical clips right axilla. Diffuse osteopenia.  IMPRESSION: COPD. Superimposed interstitial and airspace opacities may reflect pulmonary edema and/or multifocal pneumonia.  Right greater than left pleural effusions.   Electronically Signed   By: Carlos Levering M.D.   On: 10/20/2013 02:15   US Thoracentesis Asp Pleural Space W/img Guide  10/20/2013   CLINICAL DATA:  Patient with history of COPD, remote right breast carcinoma, right greater than left pleural effusions, dyspnea. Request is made for diagnostic right thoracentesis.  EXAM: ULTRASOUND GUIDED DIAGNOSTIC RIGHT THORACENTESIS  COMPARISON:  None.  PROCEDURE: An ultrasound guided thoracentesis was thoroughly discussed with the patient and  questions answered. The benefits, risks, alternatives and complications were also discussed. The patient understands and wishes to proceed with the procedure. Written consent was obtained.  Ultrasound was performed to localize and mark an adequate pocket of fluid in the right chest. The area was then prepped and draped in the normal sterile fashion. 1% Lidocaine was used for local anesthesia. Under ultrasound guidance a 19 gauge Yueh catheter  was introduced. Thoracentesis was performed. The catheter was removed and a dressing applied.  Complications:  None.  FINDINGS: A total of approximately 380 cc's of slightly turbid, amber fluid was removed. The fluid sample wassent for laboratory analysis. Due to the patient's hypotension only the above amount of fluid was removed at this time.  IMPRESSION: Successful ultrasound guided diagnostic right thoracentesis yielding 380 cc's of pleural fluid.  Read by: Rowe Robert, PA-C   Electronically Signed   By: Aletta Edouard M.D.   On: 10/20/2013 10:17    Microbiology: Recent Results (from the past 240 hour(s))  BODY FLUID CULTURE     Status: None   Collection Time    10/20/13 10:27 AM      Result Value Ref Range Status   Specimen Description PLEURAL FLUID RIGHT   Final   Special Requests 60ML FLUID   Final   Gram Stain     Final   Value: RARE WBC PRESENT, PREDOMINANTLY MONONUCLEAR     NO ORGANISMS SEEN     Performed at [facility]   Culture     Final   Value: NO GROWTH 4 DAYS     Performed at [facility]   Report Status 10/24/2013 FINAL   Final  MRSA PCR SCREENING     Status: Abnormal   Collection Time    10/20/13  1:02 PM      Result Value Ref Range Status   MRSA by PCR POSITIVE (*) NEGATIVE Final   Comment:            The GeneXpert MRSA Assay (FDA     approved for NASAL specimens     only), is one component of a     comprehensive MRSA colonization     surveillance program. It is not     intended to diagnose MRSA     infection  nor to guide or     monitor treatment for     MRSA infections.     RESULT CALLED TO, READ BACK BY AND VERIFIED WITH:     T. FAIRING RN 14:30 10/20/13 ([email or web address])     Labs: Basic Metabolic Panel:  Recent Labs Lab 10/26/13 0935 10/26/13 1015 10/27/13 0242 10/28/13 0446 10/29/13 0410  NA 141 139 142 142 141  K 5.6* 5.2 4.6 4.1 4.1  CL 105 110 108 104 106  CO2 20  --  23 26 24   GLUCOSE 106* 112* 90 88 83  BUN 31* 36* 33* 33* 34*  CREATININE 1.06 1.20* 1.29* 1.11* 0.98  CALCIUM 8.2*  --  7.6* 7.6* 8.1*   Liver Function Tests: No results found for this basename: AST, ALT, ALKPHOS, BILITOT, PROT, ALBUMIN,  in the last 168 hours No results found for this basename: LIPASE, AMYLASE,  in the last 168 hours No results found for this basename: AMMONIA,  in the last 168 hours CBC:  Recent Labs Lab 10/26/13 0824 10/26/13 1015  WBC 12.6*  --   HGB 13.0 17.7*  HCT 41.1 52.0*  MCV 87.8  --   PLT 380  --    Cardiac Enzymes: No results found for this basename: CKTOTAL, CKMB, CKMBINDEX, TROPONINI,  in the last 168 hours BNP: BNP (last 3 results)  Recent Labs  03/06/13 0226 10/20/13 0138 10/26/13 0935  PROBNP 29707.0* 9906.0* 25631.0*   CBG: No results found for this basename: GLUCAP,  in the last 168 hours     Signed:  Kelvin Cellar  [facility] 10/29/2013, 1:18  PM

## 2013-10-30 ENCOUNTER — Ambulatory Visit: Payer: Medicare Other | Admitting: Internal Medicine

## 2013-10-30 ENCOUNTER — Encounter: Payer: Self-pay | Admitting: Cardiology

## 2013-10-30 ENCOUNTER — Ambulatory Visit (INDEPENDENT_AMBULATORY_CARE_PROVIDER_SITE_OTHER): Payer: Medicare Other | Admitting: Cardiology

## 2013-10-30 ENCOUNTER — Telehealth: Payer: Self-pay | Admitting: Internal Medicine

## 2013-10-30 VITALS — HR 87 | Ht 63.0 in | Wt 90.0 lb

## 2013-10-30 DIAGNOSIS — R222 Localized swelling, mass and lump, trunk: Secondary | ICD-10-CM

## 2013-10-30 DIAGNOSIS — I5022 Chronic systolic (congestive) heart failure: Secondary | ICD-10-CM

## 2013-10-30 DIAGNOSIS — E43 Unspecified severe protein-calorie malnutrition: Secondary | ICD-10-CM

## 2013-10-30 DIAGNOSIS — R918 Other nonspecific abnormal finding of lung field: Secondary | ICD-10-CM

## 2013-10-30 MED ORDER — METOPROLOL SUCCINATE ER 25 MG PO TB24: 25.0000 mg | ORAL_TABLET | Freq: Every day | ORAL | Status: AC

## 2013-10-30 MED ORDER — LISINOPRIL 2.5 MG PO TABS: 2.5000 mg | ORAL_TABLET | Freq: Every day | ORAL | Status: AC

## 2013-10-30 NOTE — Telephone Encounter (Signed)
Called pt. She reports she has problems with sleeping through the night. Can't go back to sleep and feeling tired during next day. I advised pt she should call PCP or she can try melatonin in the meantime. Nothing further needed

## 2013-10-30 NOTE — Patient Instructions (Addendum)
Please take Metoprolol succinate 25 mg once a day, start Lisinopril 2.5 mg once a day. Continue all other medications as listed.  Please weigh daily, at the same time of day and in the same amount of clothing.  Please limit your sodium intake to approximately 1.5 grams or 1,500 milligrams a day.  Please limit your fluid intake to approximately 1,500 cc/ml a day.  Follow up in 2 months with Dr Marlou Porch.
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
## 2013-10-30 NOTE — Telephone Encounter (Signed)
Calling again with questions about appt [phone]

## 2013-10-30 NOTE — Progress Notes (Signed)
UR completed - Retro for Day 3   Catricia Scheerer K. Jackey Housey, RN, BSN, MSHL, CCM  10/30/2013 10:43 AM

## 2013-11-01 ENCOUNTER — Emergency Department (HOSPITAL_COMMUNITY): Payer: Medicare Other

## 2013-11-01 ENCOUNTER — Emergency Department (HOSPITAL_COMMUNITY)
Admission: EM | Admit: 2013-11-01 | Discharge: 2013-11-20 | Disposition: E | Payer: Medicare Other | Attending: Emergency Medicine | Admitting: Emergency Medicine

## 2013-11-01 ENCOUNTER — Encounter (HOSPITAL_COMMUNITY): Payer: Self-pay | Admitting: Emergency Medicine

## 2013-11-01 DIAGNOSIS — M810 Age-related osteoporosis without current pathological fracture: Secondary | ICD-10-CM | POA: Insufficient documentation

## 2013-11-01 DIAGNOSIS — Z87828 Personal history of other (healed) physical injury and trauma: Secondary | ICD-10-CM | POA: Diagnosis not present

## 2013-11-01 DIAGNOSIS — Z862 Personal history of diseases of the blood and blood-forming organs and certain disorders involving the immune mechanism: Secondary | ICD-10-CM | POA: Diagnosis not present

## 2013-11-01 DIAGNOSIS — J441 Chronic obstructive pulmonary disease with (acute) exacerbation: Secondary | ICD-10-CM | POA: Insufficient documentation

## 2013-11-01 DIAGNOSIS — R7989 Other specified abnormal findings of blood chemistry: Secondary | ICD-10-CM

## 2013-11-01 DIAGNOSIS — Z9181 History of falling: Secondary | ICD-10-CM | POA: Diagnosis not present

## 2013-11-01 DIAGNOSIS — R7402 Elevation of levels of lactic acid dehydrogenase (LDH): Secondary | ICD-10-CM | POA: Insufficient documentation

## 2013-11-01 DIAGNOSIS — Z7982 Long term (current) use of aspirin: Secondary | ICD-10-CM | POA: Insufficient documentation

## 2013-11-01 DIAGNOSIS — Z8669 Personal history of other diseases of the nervous system and sense organs: Secondary | ICD-10-CM | POA: Insufficient documentation

## 2013-11-01 DIAGNOSIS — I5022 Chronic systolic (congestive) heart failure: Secondary | ICD-10-CM | POA: Insufficient documentation

## 2013-11-01 DIAGNOSIS — Z9010 Acquired absence of unspecified breast and nipple: Secondary | ICD-10-CM | POA: Insufficient documentation

## 2013-11-01 DIAGNOSIS — Z87891 Personal history of nicotine dependence: Secondary | ICD-10-CM | POA: Insufficient documentation

## 2013-11-01 DIAGNOSIS — I739 Peripheral vascular disease, unspecified: Secondary | ICD-10-CM | POA: Diagnosis not present

## 2013-11-01 DIAGNOSIS — N289 Disorder of kidney and ureter, unspecified: Secondary | ICD-10-CM | POA: Insufficient documentation

## 2013-11-01 DIAGNOSIS — Z85828 Personal history of other malignant neoplasm of skin: Secondary | ICD-10-CM | POA: Diagnosis not present

## 2013-11-01 DIAGNOSIS — Z853 Personal history of malignant neoplasm of breast: Secondary | ICD-10-CM | POA: Diagnosis not present

## 2013-11-01 DIAGNOSIS — Z8781 Personal history of (healed) traumatic fracture: Secondary | ICD-10-CM | POA: Insufficient documentation

## 2013-11-01 DIAGNOSIS — Z87440 Personal history of urinary (tract) infections: Secondary | ICD-10-CM | POA: Insufficient documentation

## 2013-11-01 DIAGNOSIS — Z79899 Other long term (current) drug therapy: Secondary | ICD-10-CM | POA: Diagnosis not present

## 2013-11-01 DIAGNOSIS — I4892 Unspecified atrial flutter: Secondary | ICD-10-CM | POA: Insufficient documentation

## 2013-11-01 DIAGNOSIS — I10 Essential (primary) hypertension: Secondary | ICD-10-CM | POA: Insufficient documentation

## 2013-11-01 DIAGNOSIS — I4891 Unspecified atrial fibrillation: Secondary | ICD-10-CM | POA: Diagnosis not present

## 2013-11-01 DIAGNOSIS — R0602 Shortness of breath: Secondary | ICD-10-CM | POA: Diagnosis present

## 2013-11-01 DIAGNOSIS — R0902 Hypoxemia: Secondary | ICD-10-CM

## 2013-11-01 DIAGNOSIS — Z880 Allergy status to penicillin: Secondary | ICD-10-CM | POA: Diagnosis not present

## 2013-11-01 DIAGNOSIS — R7401 Elevation of levels of liver transaminase levels: Secondary | ICD-10-CM | POA: Insufficient documentation

## 2013-11-01 DIAGNOSIS — R740 Nonspecific elevation of levels of transaminase and lactic acid dehydrogenase [LDH]: Secondary | ICD-10-CM

## 2013-11-01 DIAGNOSIS — I509 Heart failure, unspecified: Secondary | ICD-10-CM

## 2013-11-01 DIAGNOSIS — Z8719 Personal history of other diseases of the digestive system: Secondary | ICD-10-CM | POA: Insufficient documentation

## 2013-11-01 LAB — BASIC METABOLIC PANEL
ANION GAP: 17 — AB (ref 5–15)
BUN: 30 mg/dL — ABNORMAL HIGH (ref 6–23)
CO2: 17 meq/L — AB (ref 19–32)
CREATININE: 1.21 mg/dL — AB (ref 0.50–1.10)
Calcium: 9.9 mg/dL (ref 8.4–10.5)
Chloride: 105 mEq/L (ref 96–112)
GFR calc Af Amer: 47 mL/min — ABNORMAL LOW (ref >90)
GFR calc non Af Amer: 41 mL/min — ABNORMAL LOW (ref >90)
Glucose, Bld: 216 mg/dL — ABNORMAL HIGH (ref 70–99)
Potassium: 5.6 mEq/L — ABNORMAL HIGH (ref 3.7–5.3)
SODIUM: 139 meq/L (ref 137–147)

## 2013-11-01 LAB — CBC WITH DIFFERENTIAL/PLATELET
BASOS ABS: 0.1 10*3/uL (ref 0.0–0.1)
Basophils Relative: 0 % (ref 0–1)
Eosinophils Absolute: 0.3 10*3/uL (ref 0.0–0.7)
Eosinophils Relative: 2 % (ref 0–5)
HEMATOCRIT: 43.2 % (ref 36.0–46.0)
Hemoglobin: 13.4 g/dL (ref 12.0–15.0)
LYMPHS PCT: 20 % (ref 12–46)
Lymphs Abs: 3.3 10*3/uL (ref 0.7–4.0)
MCH: 27.9 pg (ref 26.0–34.0)
MCHC: 31 g/dL (ref 30.0–36.0)
MCV: 89.8 fL (ref 78.0–100.0)
MONO ABS: 1.1 10*3/uL — AB (ref 0.1–1.0)
Monocytes Relative: 7 % (ref 3–12)
Neutro Abs: 11.7 10*3/uL — ABNORMAL HIGH (ref 1.7–7.7)
Neutrophils Relative %: 71 % (ref 43–77)
Platelets: 331 10*3/uL (ref 150–400)
RBC: 4.81 MIL/uL (ref 3.87–5.11)
RDW: 15.1 % (ref 11.5–15.5)
WBC: 16.4 10*3/uL — AB (ref 4.0–10.5)

## 2013-11-01 LAB — TROPONIN I

## 2013-11-01 LAB — PRO B NATRIURETIC PEPTIDE: PRO B NATRI PEPTIDE: 20322 pg/mL — AB (ref 0–450)

## 2013-11-01 LAB — I-STAT CG4 LACTIC ACID, ED: LACTIC ACID, VENOUS: 4.99 mmol/L — AB (ref 0.5–2.2)

## 2013-11-01 MED ORDER — ONDANSETRON HCL 4 MG/2ML IJ SOLN
4.0000 mg | Freq: Once | INTRAMUSCULAR | Status: AC
  Administered 2013-11-01: 4 mg via INTRAVENOUS
  Filled 2013-11-01: qty 2

## 2013-11-01 MED ORDER — FUROSEMIDE 10 MG/ML IJ SOLN
40.0000 mg | Freq: Once | INTRAMUSCULAR | Status: AC
  Administered 2013-11-01: 40 mg via INTRAVENOUS
  Filled 2013-11-01: qty 4

## 2013-11-01 MED ORDER — LORAZEPAM 2 MG/ML IJ SOLN
0.5000 mg | Freq: Once | INTRAMUSCULAR | Status: AC
  Administered 2013-11-01: 0.5 mg via INTRAVENOUS
  Filled 2013-11-01: qty 1

## 2013-11-03 ENCOUNTER — Ambulatory Visit: Payer: Self-pay | Admitting: Internal Medicine

## 2013-11-13 ENCOUNTER — Encounter: Payer: Self-pay | Admitting: Cardiology

## 2013-11-13 ENCOUNTER — Ambulatory Visit: Payer: Self-pay | Admitting: Family

## 2013-11-20 NOTE — ED Notes (Signed)
X-ray at bedside

## 2013-11-20 NOTE — ED Notes (Signed)
Pt reported she is getting tired but does not want ETT, dr Roxanne Mins aware and attempting bipap, tolerating well at this time.[place] neighbor contacted and will come to hospital.
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
## 2013-11-20 NOTE — ED Notes (Signed)
Spoke with [employer], [phone]

## 2013-11-20 NOTE — ED Notes (Signed)
Pt now comfort care

## 2013-11-20 NOTE — Progress Notes (Signed)
Chaplain responded to call from RN.  Prayed at bedside and stood with pt. And staff until death. Spent time afterward with neighbor of pt. Who was at her bedside.  Rev. [place], [place]

## 2013-11-20 NOTE — ED Notes (Signed)
Pt here by ems for SOB, pt would not tolerate CPAP, tolerating NRB at present, recent discharge from here. Pt rales on right side, clear left per ems, pt vomiting on arrival, pt with ems, 89 %, tach 120, sr with artifact, and 20 g in left arm. Lives at home alone with dog.

## 2013-11-20 NOTE — Progress Notes (Signed)
RT Note: Pt agreed to try Bipap again. Pt placed on 12/8 100% FiO2, RR 30-36, SPO2 84-100%. HR 118, Pt's breathing is still labored but she seems more comfortable. Pt does not wish to be intubated at this time. RT will continue to monitor

## 2013-11-20 NOTE — ED Notes (Signed)
Time of death 64

## 2013-11-20 NOTE — ED Notes (Signed)
Dr Roxanne Mins aware of oxygen and at bedside. Husband called and will call backf [facility], bipap discontinued at 0545 pt not tolerating.

## 2013-11-20 NOTE — Progress Notes (Signed)
RT Note: Pt came in by EMS was on CPAP but pulled mask off and was placed on NRB. Pt vomiting on arrival. Pt tolerating well on NRB at this time. SPO2 93%. RT will continue to monitor.

## 2013-11-20 NOTE — Progress Notes (Signed)
RT Note: Pt off bipap and back on NRB

## 2013-11-20 NOTE — ED Notes (Signed)
MD at bedside. 

## 2013-11-20 DEATH — deceased

## 2014-01-02 ENCOUNTER — Ambulatory Visit: Payer: Self-pay | Admitting: Cardiology

## 2014-01-29 ENCOUNTER — Encounter (HOSPITAL_COMMUNITY): Payer: Self-pay | Admitting: Vascular Surgery

## 2014-03-05 ENCOUNTER — Encounter (HOSPITAL_COMMUNITY): Payer: Self-pay | Admitting: Specialist

## 2015-03-27 IMAGING — CR DG CHEST 1V PORT
1 series · 1 of 1 positions shown · non-contrast
Comparison: 10/20/2013; 03/05/2013; chest CT - 10/20/2013

CLINICAL DATA: Sudden onset of shortness of breath this morning.
History of CHF and COPD

EXAM:
PORTABLE CHEST - 1 VIEW

[AP]
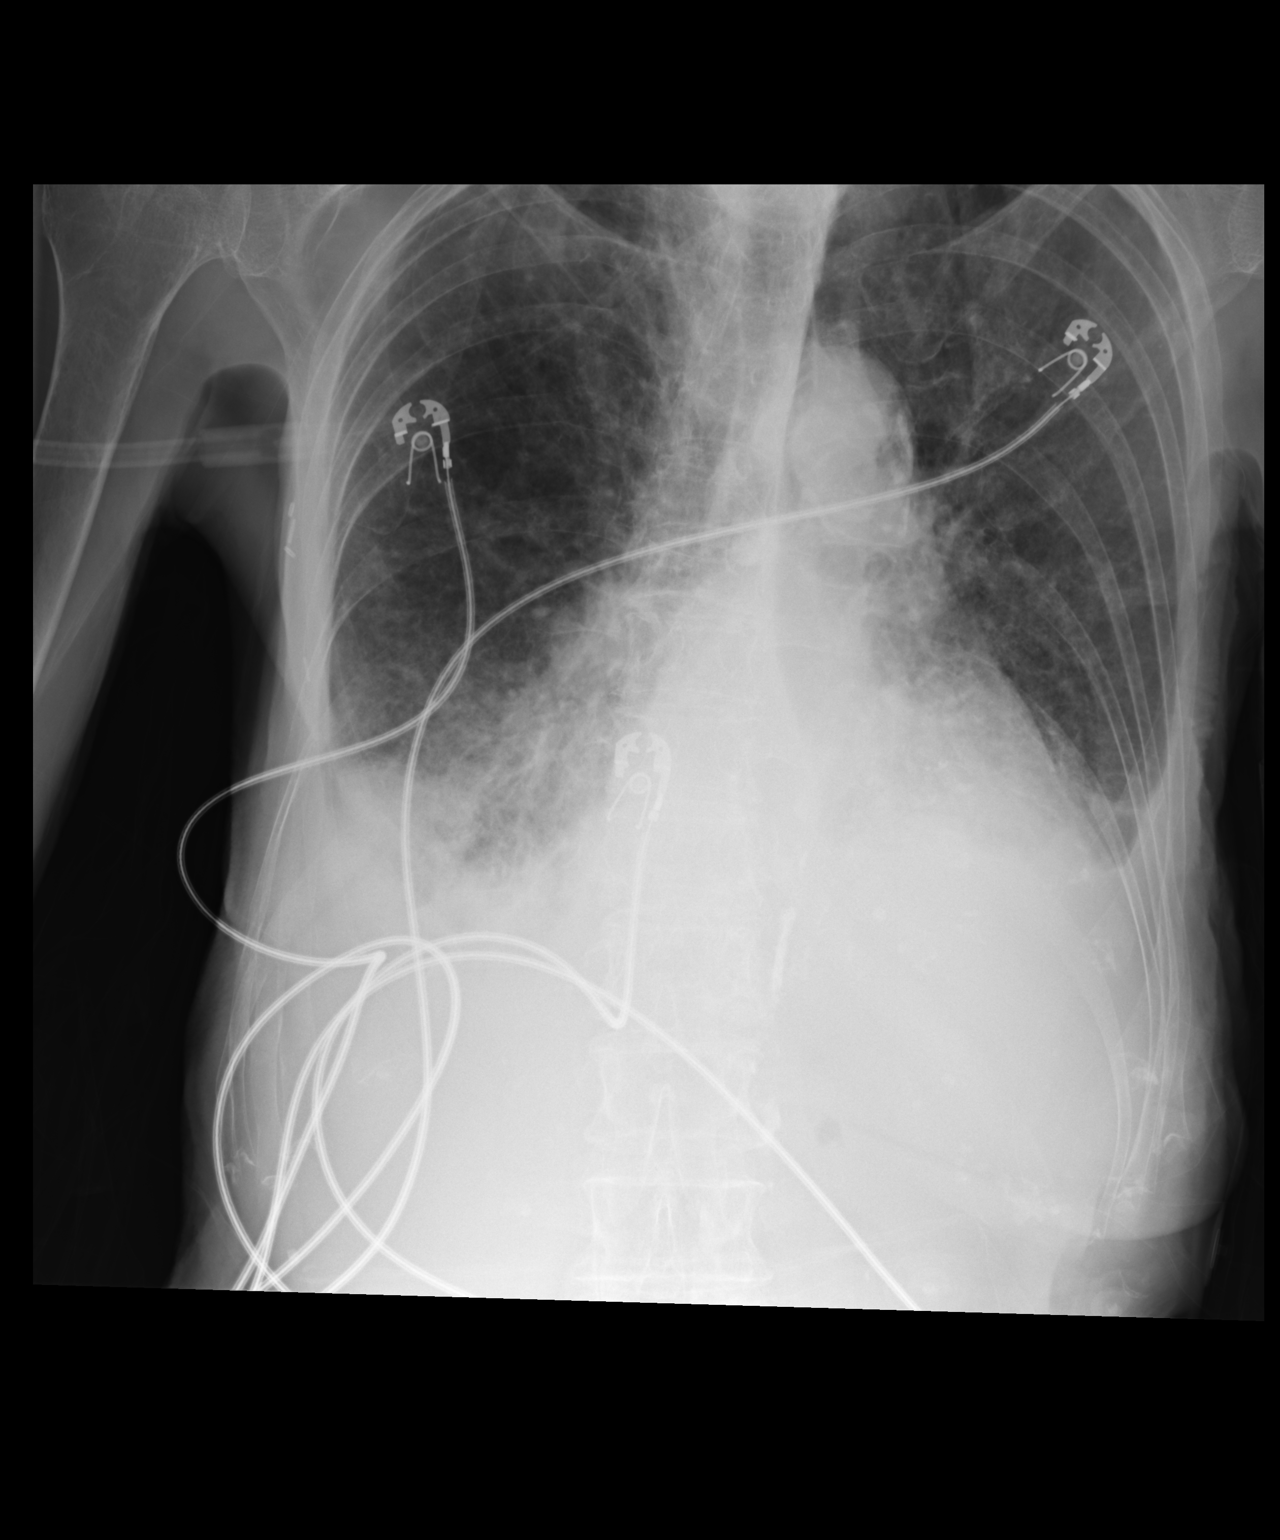

[1 of 1 positions shown; findings below may reference images not displayed]

FINDINGS: Grossly unchanged cardiac silhouette and mediastinal contours with
atherosclerotic plaque within the thoracic aorta. Pulmonary
vasculature appears indistinct with cephalization of flow. Interval
increase in size of small bilateral effusions, now with partial
obscuration of known mass within the right lower lung. Worsening
bibasilar heterogeneous opacities. No pneumothorax. Unchanged bones.
IMPRESSION: 1. Findings worrisome for progressive pulmonary edema superimposed
on advanced emphysematous change with interval increase in small
bilateral effusions and associated bibasilar opacities, atelectasis
versus infiltrate.
2. Partial obscuration of known right lower lobe mass again
worrisome for bronchogenic carcinoma.

## 2015-04-02 IMAGING — CR DG CHEST 1V PORT
1 series · 1 of 1 positions shown · non-contrast
Comparison: Prior radiograph from 10/26/2013

CLINICAL DATA: Dyspnea

EXAM:
PORTABLE CHEST - 1 VIEW

[AP]
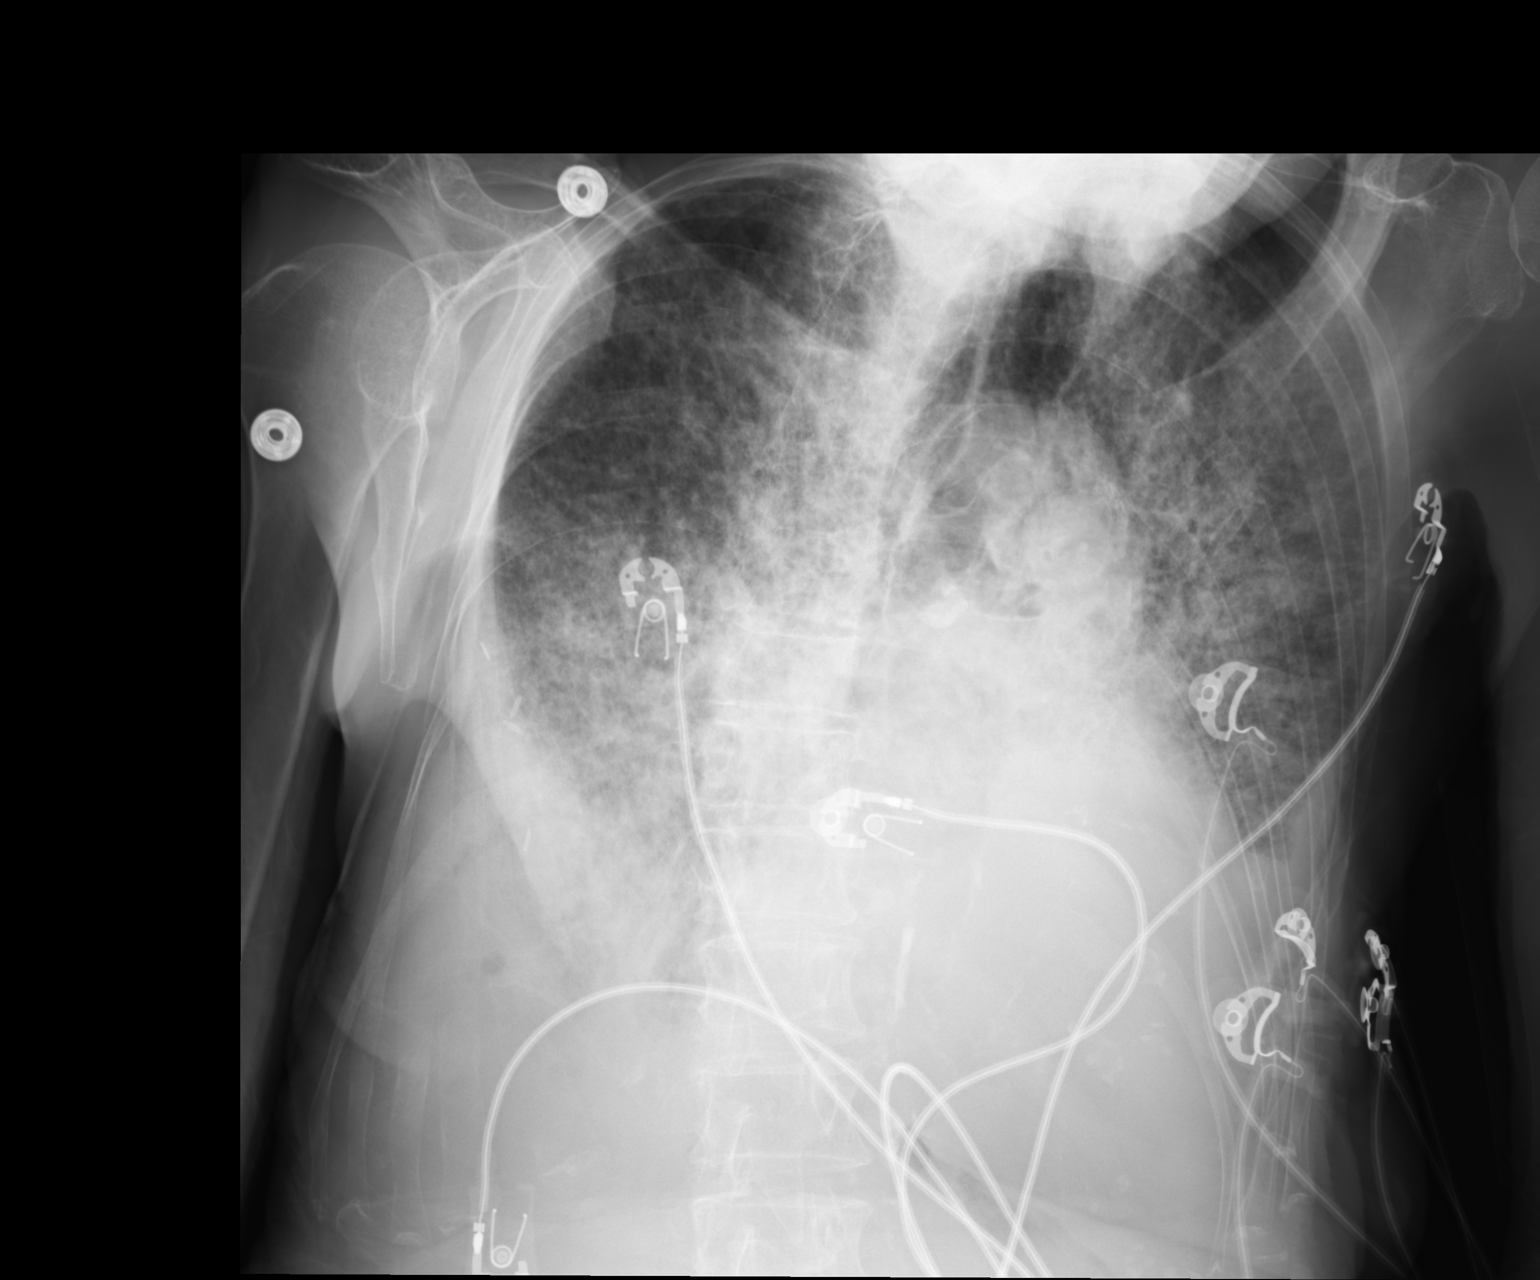

[1 of 1 positions shown; findings below may reference images not displayed]

FINDINGS: Patient is markedly rotated to the left. Allowing for rotation,
cardiomegaly is grossly stable in the mediastinal silhouette is
unchanged.

Chronic underlying lung changes consistent with COPD again seen.
There has been interval worsening and superimposed multi focal
interstitial opacities, worrisome for progressive pulmonary edema.
Bilateral pleural effusions also may be slightly worsened. There is
associated bibasilar opacities, likely atelectasis, although
possible infiltrates are not entirely excluded. Known right lower
lobe mass not definitely seen on this exam.

No pneumothorax.

Diffuse osteopenia noted. Surgical clips overlie the right chest. No
acute osseus abnormality.
IMPRESSION: 1. COPD with superimposed pulmonary edema, worsened as compared to
prior radiograph from 10/26/2013. Bilateral pleural effusions also
may be slightly worsened. Associated bibasilar opacities likely
reflect atelectasis, although possible infiltrates not entirely
excluded.
2. Nonvisualization of known right lower lobe mass, obscured by
overlying opacities.
